# Patient Record
Sex: Male | Born: 1935 | Race: White | Hispanic: No | Smoking: Former smoker
Health system: Southern US, Community
[De-identification: ages and names within clinical notes are randomized; demographics above are authoritative.]

## PROBLEM LIST (undated history)

## (undated) DIAGNOSIS — R339 Retention of urine, unspecified: Secondary | ICD-10-CM

## (undated) DIAGNOSIS — N411 Chronic prostatitis: Secondary | ICD-10-CM

## (undated) DIAGNOSIS — G20 Parkinson's disease: Secondary | ICD-10-CM

## (undated) DIAGNOSIS — R55 Syncope and collapse: Secondary | ICD-10-CM

## (undated) DIAGNOSIS — G459 Transient cerebral ischemic attack, unspecified: Secondary | ICD-10-CM

## (undated) DIAGNOSIS — R972 Elevated prostate specific antigen [PSA]: Secondary | ICD-10-CM

## (undated) DIAGNOSIS — Z950 Presence of cardiac pacemaker: Secondary | ICD-10-CM

## (undated) DIAGNOSIS — G20A1 Parkinson's disease without dyskinesia, without mention of fluctuations: Secondary | ICD-10-CM

## (undated) DIAGNOSIS — R079 Chest pain, unspecified: Secondary | ICD-10-CM

## (undated) DIAGNOSIS — R350 Frequency of micturition: Secondary | ICD-10-CM

## (undated) DIAGNOSIS — N400 Enlarged prostate without lower urinary tract symptoms: Secondary | ICD-10-CM

## (undated) DIAGNOSIS — R2681 Unsteadiness on feet: Secondary | ICD-10-CM

## (undated) HISTORY — DX: Transient cerebral ischemic attack, unspecified: G45.9

## (undated) HISTORY — DX: Elevated prostate specific antigen (PSA): R97.20

## (undated) HISTORY — DX: Frequency of micturition: R35.0

## (undated) HISTORY — DX: Chronic prostatitis: N41.1

## (undated) HISTORY — DX: Benign prostatic hyperplasia without lower urinary tract symptoms: N40.0

## (undated) HISTORY — DX: Retention of urine, unspecified: R33.9

---

## 2005-08-09 ENCOUNTER — Ambulatory Visit: Payer: Self-pay | Admitting: Ophthalmology

## 2007-06-26 ENCOUNTER — Ambulatory Visit: Payer: Self-pay | Admitting: Gastroenterology

## 2007-11-29 ENCOUNTER — Other Ambulatory Visit: Payer: Self-pay

## 2007-11-29 ENCOUNTER — Inpatient Hospital Stay: Payer: Self-pay | Admitting: Internal Medicine

## 2008-01-09 ENCOUNTER — Ambulatory Visit: Payer: Self-pay | Admitting: Specialist

## 2013-02-14 DIAGNOSIS — N411 Chronic prostatitis: Secondary | ICD-10-CM | POA: Insufficient documentation

## 2013-02-14 DIAGNOSIS — R339 Retention of urine, unspecified: Secondary | ICD-10-CM | POA: Insufficient documentation

## 2013-02-14 DIAGNOSIS — R972 Elevated prostate specific antigen [PSA]: Secondary | ICD-10-CM | POA: Insufficient documentation

## 2013-02-14 DIAGNOSIS — R350 Frequency of micturition: Secondary | ICD-10-CM | POA: Insufficient documentation

## 2014-05-14 ENCOUNTER — Encounter (INDEPENDENT_AMBULATORY_CARE_PROVIDER_SITE_OTHER): Payer: Self-pay

## 2014-05-14 ENCOUNTER — Ambulatory Visit (INDEPENDENT_AMBULATORY_CARE_PROVIDER_SITE_OTHER): Payer: Medicare Other | Admitting: Cardiovascular Disease

## 2014-05-14 ENCOUNTER — Encounter: Payer: Self-pay | Admitting: Cardiovascular Disease

## 2014-05-14 VITALS — BP 110/82 | HR 92 | Ht 71.0 in | Wt 207.0 lb

## 2014-05-14 DIAGNOSIS — N400 Enlarged prostate without lower urinary tract symptoms: Secondary | ICD-10-CM

## 2014-05-14 DIAGNOSIS — N401 Enlarged prostate with lower urinary tract symptoms: Secondary | ICD-10-CM | POA: Insufficient documentation

## 2014-05-14 DIAGNOSIS — R5382 Chronic fatigue, unspecified: Secondary | ICD-10-CM

## 2014-05-14 DIAGNOSIS — R0602 Shortness of breath: Secondary | ICD-10-CM | POA: Insufficient documentation

## 2014-05-14 DIAGNOSIS — R29898 Other symptoms and signs involving the musculoskeletal system: Secondary | ICD-10-CM

## 2014-05-14 DIAGNOSIS — R079 Chest pain, unspecified: Secondary | ICD-10-CM | POA: Insufficient documentation

## 2014-05-14 DIAGNOSIS — R3915 Urgency of urination: Secondary | ICD-10-CM

## 2014-05-14 DIAGNOSIS — G20 Parkinson's disease: Secondary | ICD-10-CM

## 2014-05-14 NOTE — Assessment & Plan Note (Signed)
Followed by Dr Cope.  

## 2014-05-14 NOTE — Assessment & Plan Note (Signed)
Etiology of leg weakness is unclear. Unable to exclude deconditioning. He does not do regular exercise program. Possibly exacerbated by Parkinson's

## 2014-05-14 NOTE — Progress Notes (Signed)
Patient ID: Justin Hendrix, male    DOB: 02/14/1923, 80 y.o.   MRN: [id number]  HPI Comments: [name] is a pleasant 80 year old gentleman with a history of Parkinson's, tremors, seen by Dr. Achilles Dunkope for prostate issues and chronic prostatitis, urinary frequency, who presents with symptoms of fatigue, shortness of breath, chest pain, dizziness.  He reports having relatively acute onset of symptoms over the summer, 2-3 months ago. He did run a busy garden with vegetables. Over a short period of time, he found he was to short of breath and fatigued to manage the garden. He stopped attending to the garden and vegetables grew without him picking them, weeds grew. He had no energy. He had symptoms with minimal exertion and then had to sit down to recover. Morning seemed to be worse. Also reported having some lightheadedness sometimes when standing, sometimes when sitting. Occasional chest pain symptoms more commonly at rest than with exertion.  He denies any new stressors at home. Has family nearby. No financial issues. Reports he is sleeping relatively well Never had cardiac workup in the past  In terms of his social history, he used to smoke back in the [employer] but not for 60 years. No alcohol, no drugs In terms of his family history, he has hypertension in the family Ammann skin cancer, no significant cardiac issues. One family member with lung cancer  EKG shows normal sinus rhythm with rate 90 beats per minute with, branch block   Outpatient Encounter Prescriptions as of 05/14/2014  Medication Sig  . amantadine (SYMMETREL) 100 MG capsule Take 100 mg by mouth 2 (two) times daily.  Justin Hendrix. aspirin 81 MG tablet Take 81 mg by mouth daily.  . budesonide-formoterol (SYMBICORT) 160-4.5 MCG/ACT inhaler Inhale 2 puffs into the lungs 2 (two) times daily.  . finasteride (PROSCAR) 5 MG tablet Take 5 mg by mouth daily.  . fluticasone (FLONASE) 50 MCG/ACT nasal spray Place 2 sprays into both nostrils daily.  .  pantoprazole (PROTONIX) 40 MG tablet Take 40 mg by mouth daily.  . sertraline (ZOLOFT) 25 MG tablet Take 25 mg by mouth daily.  . tamsulosin (FLOMAX) 0.4 MG CAPS capsule Take 0.4 mg by mouth 2 (two) times daily.      Review of Systems  Constitutional: Positive for fatigue.  HENT: Negative.   Eyes: Negative.   Respiratory: Positive for chest tightness and shortness of breath.   Cardiovascular: Negative.   Gastrointestinal: Negative.   Endocrine: Negative.   Musculoskeletal: Negative.   Skin: Negative.   Allergic/Immunologic: Negative.   Neurological: Positive for weakness.  Hematological: Negative.   Psychiatric/Behavioral: Negative.   All other systems reviewed and are negative.   BP 110/82  Pulse 92  Ht 5\' 11"  (1.803 m)  Wt 207 lb (93.895 kg)  BMI 28.88 kg/m2  Physical Exam  Nursing note and vitals reviewed. Constitutional: He is oriented to person, place, and time. He appears well-developed and well-nourished.  HENT:  Head: Normocephalic.  Nose: Nose normal.  Mouth/Throat: Oropharynx is clear and moist.  Eyes: Conjunctivae are normal. Pupils are equal, round, and reactive to light.  Neck: Normal range of motion. Neck supple. No JVD present.  Cardiovascular: Normal rate, regular rhythm, S1 normal, S2 normal, normal heart sounds and intact distal pulses.  Exam reveals no gallop and no friction rub.   No murmur heard. Pulmonary/Chest: Effort normal and breath sounds normal. No respiratory distress. He has no wheezes. He has no rales. He exhibits no tenderness.  Abdominal: Soft. Bowel  sounds are normal. He exhibits no distension. There is no tenderness.  Musculoskeletal: Normal range of motion. He exhibits no edema and no tenderness.  Lymphadenopathy:    He has no cervical adenopathy.  Neurological: He is alert and oriented to person, place, and time. Coordination normal.  Skin: Skin is warm and dry. No rash noted. No erythema.  Psychiatric: He has a normal mood and  affect. His behavior is normal. Judgment and thought content normal.      Assessment and Plan

## 2014-05-14 NOTE — Assessment & Plan Note (Signed)
Etiology of his symptoms is unclear. Relatively acute onset. Unable to exclude ischemia. Clinical exam and EKG are relatively benign. We will schedule him for a pharmacologic Myoview to rule out ischemia. Clinical exam does not suggest valvular heart disease. If stress test is normal, could entertain other workup such as TSH, testosterone level, etc. We did even touch on possible depression and he did comment that "that could be it" He does see Dr. Arlana Pouchate in several days' time

## 2014-05-14 NOTE — Assessment & Plan Note (Signed)
He reports symptoms have been relatively stable. No tremor noted on today's visit

## 2014-05-14 NOTE — Assessment & Plan Note (Signed)
Etiology unclear, stress test ordered to rule out ischemia. He is unable to treadmill secondary to shortness of breath and leg weakness

## 2014-05-14 NOTE — Assessment & Plan Note (Signed)
Symptoms over the past 2-3 months. Workup as above

## 2014-05-14 NOTE — Patient Instructions (Addendum)
You are doing well. No medication changes were made.  We will schedule you for a stress test, lexiscan myoview for shortness of breath, fatigue and chest pain  Please call us if you have new issues that need to be addressed before your next appt.   [facility] MYOVIEW  Your caregiver has ordered a Stress Test with nuclear imaging. The purpose of this test is to evaluate the blood supply to your heart muscle. This procedure is referred to as a "Non-Invasive Stress Test." This is because other than having an IV started in your vein, nothing is inserted or "invades" your body. Cardiac stress tests are done to find areas of poor blood flow to the heart by determining the extent of coronary artery disease (CAD). Some patients exercise on a treadmill, which naturally increases the blood flow to your heart, while others who are  unable to walk on a treadmill due to physical limitations have a pharmacologic/chemical stress agent called Lexiscan . This medicine will mimic walking on a treadmill by temporarily increasing your coronary blood flow.   Please note: these test may take anywhere between 2-4 hours to complete  PLEASE REPORT TO [facility]  THE VOLUNTEERS AT THE FIRST DESK WILL DIRECT YOU WHERE TO GO  Date of Procedure:___Thursday, Oct 29________  Arrival Time for Procedure:____7:45am__________   PLEASE NOTIFY THE OFFICE AT LEAST 24 HOURS IN ADVANCE IF YOU ARE UNABLE TO KEEP YOUR APPOINTMENT.  [phone] AND  PLEASE NOTIFY N[facility]INE AT [facility] AT LEAST 24 HOURS IN ADVANCE IF YOU ARE UNABLE TO KEEP YOUR APPOINTMENT. [phone]  How to prepare for your Myoview test:  1. Do not eat or drink after midnight 2. No caffeine for 24 hours prior to test 3. No smoking 24 hours prior to test. 4. Your medication may be taken with water.  If your doctor stopped a medication because of this test, do not take that medication. 5. Ladies, please do not wear dresses.  Skirts or pants are  appropriate. Please wear a short sleeve shirt. 6. No perfume, cologne or lotion. 7. Wear comfortable walking shoes. No heels!  Your next appointment will be scheduled in our new office located at :  [facility]  [address], [address]  [place], [place] [id number]

## 2014-05-22 ENCOUNTER — Ambulatory Visit: Payer: Self-pay | Admitting: Cardiovascular Disease

## 2014-05-22 DIAGNOSIS — R079 Chest pain, unspecified: Secondary | ICD-10-CM

## 2014-05-23 ENCOUNTER — Other Ambulatory Visit: Payer: Self-pay

## 2014-05-23 DIAGNOSIS — R079 Chest pain, unspecified: Secondary | ICD-10-CM

## 2014-05-23 DIAGNOSIS — R0602 Shortness of breath: Secondary | ICD-10-CM

## 2014-09-15 ENCOUNTER — Observation Stay: Payer: Self-pay | Admitting: Internal Medicine

## 2014-09-16 ENCOUNTER — Ambulatory Visit: Payer: Self-pay | Admitting: Neurology

## 2014-09-16 DIAGNOSIS — I351 Nonrheumatic aortic (valve) insufficiency: Secondary | ICD-10-CM

## 2014-11-23 NOTE — H&P (Signed)
PATIENT NAME:  Justin Hendrix, Justin Hendrix MR#:  [id number] DATE OF BIRTH:  [birth year]  DATE OF ADMISSION:  09/15/2014  REFERRING EMERGENCY ROOM PHYSICIAN:  Daryel November, MD.   PRIMARY CARE PHYSICIAN: Elray Buba MD.    CHIEF COMPLAINT: Syncope with head injury.   HISTORY OF PRESENT ILLNESS: This very pleasant 81 year old man with past medical history of Parkinson disease, possible TIA in the past, gastroesophageal reflux disease and hypertension, presents today after syncopal event at home. He reports that he was walking towards the bathroom when he passed out and fell to the ground. He does not remember any prodrome, no dizziness, palpitations, or weakness, the next thing he remembers he was on the ground. History provided by his son who was present in the house is that the son heard a thump and ran to find his father face down on the floor with blood on the left side of his head.  The son then called EMS. He noted some movement and that his father had lost bladder continence. Upon arrival of EMS it is unclear whether the patient was confused, seems that he was oriented. He hit his head on the door frame on the way down and has a very large laceration on the right scalp. He has no history of seizure. He does report that for many months now he has been getting very dizzy when standing for long periods of time or right after standing up and he has also been having headaches.  He has been working with his primary care physician on this, but he has not had any improvement.   PAST MEDICAL HISTORY:  1. Hypertension, not currently on any antihypertensives.  2. Gastroesophageal reflux disease.  3. Parkinson disease.  4. Depression.  5. History of prostatitis with no recent flares.  6. Chronic back pain.  7. Possible history of TIA.   SOCIAL HISTORY: The patient lives with his son. He does not use a cane or a walker. He does chew tobacco since the age of 54.  He does not smoke cigarettes currently but did  smoke heavily in the past. Does not drink alcohol or use any illicit substances. He was in the [employer].   FAMILY MEDICAL HISTORY: Positive for coronary artery disease and stroke in his father, cervical cancer in his mother.   ALLERGIES: No known allergies.   HOME MEDICATIONS:  1. Vitamin B12 500 mcg 1 tablet once a day.  2. Tizanidine 2 mg 1 tablet twice a day.  3. Tamsulosin 0.4 mg 1 capsule twice a day.  4. Symbicort 160 mcg-4.5 mcg per inhalation 2 puffs inhaled twice a day.  5. Sertraline 100 mg 1 tablet once a day.  6. Pantoprazole 40 mg 1 tablet once a day.  7. Fluticasone nasal spray 50 mcg per inhalations 1 spray once a day.  8. Finasteride 5 mg 1 tablet once a day.  9. Aspirin 81 mg 1 tablet once a day.  10. Amantadine 100 mg 1 capsule 2 times a day.     REVIEW OF SYSTEMS:  CONSTITUTIONAL: Negative for fevers, fatigue, weakness, pain, or change in weight.  HEENT: No change in vision or hearing. No pain in the eyes or ears. No sinus congestion, sore throat, or difficulty swallowing.  RESPIRATORY: No cough, wheezing, shortness of breath, or painful respirations.  CARDIOVASCULAR: No chest pain, palpitations, edema, orthopnea. Positive for syncope as noted above.  GASTROINTESTINAL: No nausea, vomiting, diarrhea. Positive for decreased appetite for the past 4-5 days. Negative for abdominal  pain.  GENITOURINARY: No dysuria or frequency. No pain suggestive of prostatitis flare.  SKIN: No new rashes or lesions. There is a new laceration on the right scalp from fall.  HEMATOLOGIC: No easy bruising or bleeding.  ENDOCRINE: No polyuria, polydipsia, hot or cold intolerance.  MUSCULOSKELETAL: No new pain in the neck, back, shoulders, knees, or hips. He does have a history of osteoarthritis. No gout.  NEUROLOGIC: Possible history of TIA. The patient reports he has had a workup in the past which was negative. No history of seizure. No focal numbness or weakness. He does have a history of  Parkinson disease, has sometimes difficulty with fine motor on the right hand and has a stumbling gait. Positive for recent lightheadedness with standing and headache.  PSYCHIATRIC: No schizophrenia or bipolar disorder. Positive for history of depression.   PHYSICAL EXAMINATION:  VITAL SIGNS: Temperature 97.4, pulse 65, respirations 16, blood pressure 127/89, oxygenation 99% on room air.  GENERAL: No acute distress. Patient resting comfortably in the exam bed with a bandage around his head.  HEENT: Pupils equal, round, and reactive to light. Conjunctivae are clear. Extraocular motion intact. Oral mucous membranes pink and moist. Posterior oropharynx is clear with no exudate, erythema, or edema.  NECK: Supple. Trachea midline. No cervical lymphadenopathy.  RESPIRATORY: Lungs clear to auscultation bilaterally with good air movement.  CARDIOVASCULAR: Regular rate and rhythm. No murmurs, rubs, or gallops. No peripheral edema. Peripheral pulses 2 +.  ABDOMEN: Soft, nontender, nondistended. Bowel sounds normal. No guarding or rebound. No hepatosplenomegaly. No mass.  MUSCULOSKELETAL: No joint effusions. Range of motion normal. Strength 5 out of 5 throughout.  SKIN: He has many moles, skin tags, and hemangiomas over the entirety of the skin. No rash. He does have a laceration on the right scalp which is currently bandaged, the bandage is clean and dry, not currently bleeding.  NEUROLOGIC: Cranial nerves II through XII grossly intact. Strength and sensation are intact. Cerebellar exam is normal. Good tone.  PSYCHIATRIC: Alert and oriented x 4 with good insight into his clinical condition. No signs of uncontrolled depression or anxiety.   LABORATORY DATA: Sodium 131, potassium 3.9, chloride 97, bicarbonate 24, BUN 14, creatinine 1.62, glucose 178, calcium 8.1. Total protein 6.1, albumin 2.7. Other LFTs are normal. CK 104, CK-MB less than 0.05. Troponin less than 0.02. Hemoglobin 10, white blood cells 6.8,  platelets 271,000, MCV is 75.   IMAGING: CT scan of the head without contrast shows soft tissue swelling and laceration overlying the right frontal bone, no evidence of calvarium fracture, no evidence of acute intracranial abnormality, atrophy with small vessel ischemic changes and intracranial atherosclerosis is present.    Chest x-ray shows no evidence of cardiopulmonary disease.     ASSESSMENT AND PLAN:  1.  Syncopal event:  It sounds as if with his decreased appetite for several days, recent dizziness with standing, and presenting blood pressure of 96/60, this was most likely an orthostatic event. I am replacing volume with IV fluids. He is not on any antihypertensives or diuretics at home. He is on several medications for BPH. I will hold his tamsulosin at this time. We will observe on telemetry. Check echocardiogram to look at valvular function. Cycle cardiac enzymes. He did have loss of bladder continence sounds and some jerky type movement on the floor after the syncopal event. These could potentially represent seizure, more likely he was on his way to the bathroom and then lost bladder control when he syncopized. We will consult  neurology for further recommendations.  2.  Acute kidney injury: I suspect that this is due to decreased p.o. intake and acute tubular necrosis. We will replace IV fluids and recheck in the morning. I do not know his baseline creatinine, he may also have chronic kidney disease.  3.  Hyponatremia: Mild hyponatremia likely due to decreased p.o. intake. We will replace with IV fluids. Recheck in the morning.  4.  Anemia: We will guaiac stools and check iron studies. He has a low MCV indicative of iron deficiency. He has never had a colonoscopy. No signs of overt bleeding, may benefit from an iron supplement discharge.  5.  Hyperglycemia: Check a hemoglobin A1c to evaluate blood sugars over the past 3 months. 6.  Head wound:  ED physician has placed staples. No further  bleeding.  7.  Parkinson disease: The patient reports that he is not currently followed by a neurologist. He is on amantadine, he is not on Sinemet. We will continue amantadine.  8.  Gastroesophageal reflux disease: Continue PPI.  9.  Prophylaxis: No heparin for DVT prophylaxis as he does have a head wound. He is also ambulatory at baseline. We will provide TEDs and SCDs rather than pharmacological DVT prophylaxis.  10.  Code status: The patient is a DNR.  I discussed this with him on admission and he does not want cardiopulmonary resuscitation.   TIME SPENT ON ADMISSION: 45 minutes.     ____________________________ Ena Dawleyatherine P. Clent RidgesWalsh, MD cpw:bu D: 09/15/2014 19:57:10 ET T: 09/15/2014 20:28:56 ET JOB#: [id number]  cc: Santina Evansatherine P. Clent RidgesWalsh, MD, <Dictator> Jillene Bucksenny C. Arlana Pouchate, MD Gale JourneyATHERINE P Rmoni Keplinger MD ELECTRONICALLY SIGNED 09/16/2014 8:31

## 2014-11-23 NOTE — Discharge Summary (Signed)
PATIENT NAME:  Justin Hendrix, Rishith W MR#:  [id number] DATE OF BIRTH:  01/19/1923  DATE OF ADMISSION:  09/15/2014 DATE OF DISCHARGE:  09/16/2014  PRESENTING COMPLAINT: Syncopal episode.   DISCHARGE DIAGNOSES:  1.  Syncope suspected due to orthostatic hypotension.  2.  Benign prostatic hypertrophy.  3.  Right far lateral skull laceration status post sutures in the Emergency Room.   CODE STATUS: Full code.   MEDICATIONS:  1.  Aspirin 81 mg daily.  2.  Vitamin B12 at 500 mcg p.o. daily.  3.  Protonix 40 mg p.o. daily.  4.  Sertraline 100 mg p.o. daily.  5.  Amantadine 100 mg b.i.d.  6.  Tamsulosin 0.4 mg b.i.d.  7.  Finasteride 5 mg daily.  8.  Tizanidine 2 mg b.i.d.  9.  Fluticasone nasal spray daily.  10.  Symbicort 160/4.5 two puffs b.i.d.   SERVICES: Home health PT has been arranged.   FOLLOWUP: With Dr. Arlana Pouchate in 1-2 weeks.   LABORATORY DATA: At discharge: Hemoglobin and hematocrit is 10.1 and 30.6.  Echo showed ejection fraction of 60% to 65%. No wall motion abnormality. No RVSP. Mild mitral valve regurgitation. Occult feces negative. Creatinine was 1.37.   BRIEF SUMMARY OF HOSPITAL COURSE: [name] is a very pleasant 81 year old gentleman who comes in with:  1.  Syncopal event presenting with blood pressure 96/60. Likely an orthostatic event. Received IV fluids. Not on any hypertensives or diuretics at home. The patient was encouraged p.o. fluids and his blood pressure at discharge was stable.  2.  Acute kidney injury suspected due to poor p.o. intake. Replaced IV fluids.  3.  Hyponatremia secondary to dehydration. IV fluids were received. The patient euvolemic prior to discharge.  4.  Hospital stay otherwise remained stable. The patient remained a full code. Physical therapy recommended home PT, which has been arranged.   TIME SPENT: 40 minutes. Discharge plan was discussed with the patient's son.    ____________________________ Wylie HailSona A. Allena KatzPatel, MD sap:bm D: 09/18/2014  14:28:20 ET T: 09/19/2014 01:29:22 ET JOB#: [id number]  cc: Kasee Hantz A. Allena KatzPatel, MD, <Dictator> Willow OraSONA A Ellissa Ayo MD ELECTRONICALLY SIGNED 09/23/2014 17:35

## 2014-11-23 NOTE — Consult Note (Signed)
PATIENT NAME:  Justin Hendrix, Justin Hendrix MR#:  [id number] DATE OF BIRTH:  Jan 31, 1923  DATE OF CONSULTATION:  09/16/2014  REFERRING PHYSICIAN:   CONSULTING PHYSICIAN:  Pauletta BrownsYuriy Trinidi Toppins, MD  REASON FOR CONSULTATION: Syncope.   HISTORY OF PRESENT ILLNESS: This is an 81 year old male with past medical history of questionable Parkinson disease, previous TIA, gastroesophageal reflux disease, status post syncopal event and fall. On admission, the patient appeared to be hypotensive, required IV hydration. When questioned about Parkinson disease, the patient is only on amantadine. Parkinson disease diagnosed by family doctor 8 or 9 years ago. The patient does not appear to have any tremors and no rigidity. Currently appears back to baseline, asking about discharge.   PAST MEDICAL HISTORY: Hypertension, gastroesophageal reflux disease, Parkinson's, depression, history of prostatitis, chronic back pain, history of transient ischemic attack.   REVIEW OF SYSTEMS: No shortness of breath. No chest pain. No abdominal pain. No new weakness on one side of the body compared to the other. No dizziness.   HOME MEDICATIONS: Have been reviewed.   ALLERGIES: No known drug allergies.   FAMILY HISTORY: Positive for coronary artery disease.  SOCIAL HISTORY: The patient lives with his son, chews tobacco since young age.   NEUROLOGIC EVALUATION: The patient is awake, alert and oriented to time, place, location, and the reason for why he is in the hospital. Facial sensation intact. Facial motor is intact. Tongue is midline. Uvula elevates symmetrically. Shoulder shrug intact. Motor 4+/5 in bilateral upper and lower extremities. Coordination: Finger-to-nose intact. Sensation intact. Reflexes intact. Gait not assessed.   IMPRESSION: An 81 year old male admitted with a syncopal event, now back to baseline. The patient's blood pressure was low on admission, status post IV hydration, states he feels better. I do not think this is  seizure activity. No further intervention from a neurological standpoint at this point while in the hospital. The patient should follow up with neurology as outpatient. I am not convinced this is a true diagnosis of Parkinson disease as I have not experienced seeing the patient with a tremor. His muscle tone is within normal limits as well. The patient has not progressed in the past 8 to 9 years. He has never been evaluated by a neurologist so there is question of Parkinson disease. Discharge planning.   Thank you. It was a pleasure seeing this patient.   ____________________________ Pauletta BrownsYuriy Nilah Belcourt, MD yz:sb D: 09/16/2014 13:41:25 ET T: 09/16/2014 14:10:44 ET JOB#: [id number]  cc: Pauletta BrownsYuriy Yumna Ebers, MD, <Dictator> Pauletta BrownsYURIY Retha Bither MD ELECTRONICALLY SIGNED 10/09/2014 12:06

## 2016-05-10 ENCOUNTER — Ambulatory Visit (INDEPENDENT_AMBULATORY_CARE_PROVIDER_SITE_OTHER): Payer: Medicare Other | Admitting: Podiatry

## 2016-05-10 ENCOUNTER — Encounter: Payer: Self-pay | Admitting: Podiatry

## 2016-05-10 VITALS — BP 172/127 | HR 93 | Resp 18 | Wt 210.0 lb

## 2016-05-10 DIAGNOSIS — L603 Nail dystrophy: Secondary | ICD-10-CM | POA: Diagnosis not present

## 2016-05-10 DIAGNOSIS — M79609 Pain in unspecified limb: Principal | ICD-10-CM

## 2016-05-10 DIAGNOSIS — B351 Tinea unguium: Secondary | ICD-10-CM | POA: Diagnosis not present

## 2016-05-10 DIAGNOSIS — M79676 Pain in unspecified toe(s): Secondary | ICD-10-CM

## 2016-05-10 DIAGNOSIS — L608 Other nail disorders: Secondary | ICD-10-CM

## 2016-05-10 NOTE — Progress Notes (Signed)
   Subjective:    Patient ID: Justin MusaCameron W Hendrix, male    DOB: 10/24/1922, 82 y.o.   MRN: [id number]  HPI    Review of Systems  All other systems reviewed and are negative.      Objective:   Physical Exam        Assessment & Plan:

## 2016-05-11 NOTE — Progress Notes (Signed)
Patient ID: Candis MusaCameron W Macpherson, male   DOB: 01/24/1923, 82 y.o.   MRN: [id number] SUBJECTIVE Patient  presents to office today complaining of elongated, thickened nails. Pain while ambulating in shoes. Patient is unable to trim their own nails.   OBJECTIVE General Patient is awake, alert, and oriented x 3 and in no acute distress. Derm Skin is dry and supple bilateral. Negative open lesions or macerations. Remaining integument unremarkable. Nails are tender, long, thickened and dystrophic with subungual debris, consistent with onychomycosis, 1-5 bilateral. No signs of infection noted. Vasc  DP and PT pedal pulses palpable bilaterally. Temperature gradient within normal limits.  Neuro Epicritic and protective threshold sensation diminished bilaterally.  Musculoskeletal Exam No symptomatic pedal deformities noted bilateral. Muscular strength within normal limits.  ASSESSMENT 1. Onychodystrophic nails 1-5 bilateral with hyperkeratosis of nails.  2. Onychomycosis of nail due to dermatophyte bilateral 3. Pain in foot bilateral  PLAN OF CARE 1. Patient evaluated today.  2. Instructed to maintain good pedal hygiene and foot care.  3. Mechanical debridement of nails 1-5 bilaterally performed using a nail nipper. Filed with dremel without incident.  4. Return to clinic in 3 mos.    Felecia ShellingBrent M Zayvion Stailey, DPM

## 2016-08-15 ENCOUNTER — Encounter: Payer: Self-pay | Admitting: Podiatry

## 2016-08-15 ENCOUNTER — Ambulatory Visit (INDEPENDENT_AMBULATORY_CARE_PROVIDER_SITE_OTHER): Payer: Medicare Other | Admitting: Podiatry

## 2016-08-15 DIAGNOSIS — B351 Tinea unguium: Secondary | ICD-10-CM | POA: Diagnosis not present

## 2016-08-15 DIAGNOSIS — M79609 Pain in unspecified limb: Secondary | ICD-10-CM

## 2016-08-15 NOTE — Progress Notes (Signed)
Complaint:  Visit Type: Patient returns to my office for continued preventative foot care services. Complaint: Patient states" my nails have grown long and thick and become painful to walk and wear shoes" Patient has been diagnosed with DM with no foot complications. The patient presents for preventative foot care services. No changes to ROS  Podiatric Exam: Vascular: dorsalis pedis and posterior tibial pulses are palpable bilateral. Capillary return is immediate. Temperature gradient is WNL. Skin turgor WNL  Sensorium: Diminished  Semmes Weinstein monofilament test. Normal tactile sensation bilaterally. Nail Exam: Pt has thick disfigured discolored nails with subungual debris noted bilateral entire nail hallux through fifth toenails Ulcer Exam: There is no evidence of ulcer or pre-ulcerative changes or infection. Orthopedic Exam: Muscle tone and strength are WNL. No limitations in general ROM. No crepitus or effusions noted. Foot type and digits show no abnormalities. Bony prominences are unremarkable. Skin: No Porokeratosis. No infection or ulcers  Diagnosis:  Onychomycosis, , Pain in right toe, pain in left toes  Treatment & Plan Procedures and Treatment: Consent by patient was obtained for treatment procedures. The patient understood the discussion of treatment and procedures well. All questions were answered thoroughly reviewed. Debridement of mycotic and hypertrophic toenails, 1 through 5 bilateral and clearing of subungual debris. No ulceration, no infection noted.  Return Visit-Office Procedure: Patient instructed to return to the office for a follow up visit 3 months for continued evaluation and treatment.    Mayerli Kirst DPM 

## 2016-08-16 ENCOUNTER — Ambulatory Visit: Payer: Medicare Other | Admitting: Podiatry

## 2016-12-01 ENCOUNTER — Ambulatory Visit (INDEPENDENT_AMBULATORY_CARE_PROVIDER_SITE_OTHER): Payer: Medicare Other | Admitting: Podiatry

## 2016-12-01 ENCOUNTER — Encounter: Payer: Self-pay | Admitting: Podiatry

## 2016-12-01 DIAGNOSIS — M79609 Pain in unspecified limb: Secondary | ICD-10-CM | POA: Diagnosis not present

## 2016-12-01 DIAGNOSIS — B351 Tinea unguium: Secondary | ICD-10-CM

## 2016-12-01 NOTE — Progress Notes (Signed)
Complaint:  Visit Type: Patient returns to my office for continued preventative foot care services. Complaint: Patient states" my nails have grown long and thick and become painful to walk and wear shoes" Patient has been diagnosed with DM with no foot complications. The patient presents for preventative foot care services. No changes to ROS  Podiatric Exam: Vascular: dorsalis pedis and posterior tibial pulses are palpable bilateral. Capillary return is immediate. Temperature gradient is WNL. Skin turgor WNL  Sensorium: Diminished  Semmes Weinstein monofilament test. Normal tactile sensation bilaterally. Nail Exam: Pt has thick disfigured discolored nails with subungual debris noted bilateral entire nail hallux through fifth toenails Ulcer Exam: There is no evidence of ulcer or pre-ulcerative changes or infection. Orthopedic Exam: Muscle tone and strength are WNL. No limitations in general ROM. No crepitus or effusions noted. Foot type and digits show no abnormalities. Bony prominences are unremarkable. Skin: No Porokeratosis. No infection or ulcers  Diagnosis:  Onychomycosis, , Pain in right toe, pain in left toes  Treatment & Plan Procedures and Treatment: Consent by patient was obtained for treatment procedures. The patient understood the discussion of treatment and procedures well. All questions were answered thoroughly reviewed. Debridement of mycotic and hypertrophic toenails, 1 through 5 bilateral and clearing of subungual debris. No ulceration, no infection noted.  Return Visit-Office Procedure: Patient instructed to return to the office for a follow up visit 3 months for continued evaluation and treatment.    Alasha Mcguinness DPM 

## 2017-03-06 ENCOUNTER — Ambulatory Visit (INDEPENDENT_AMBULATORY_CARE_PROVIDER_SITE_OTHER): Payer: Medicare Other | Admitting: Podiatry

## 2017-03-06 ENCOUNTER — Encounter: Payer: Self-pay | Admitting: Podiatry

## 2017-03-06 DIAGNOSIS — B351 Tinea unguium: Secondary | ICD-10-CM | POA: Diagnosis not present

## 2017-03-06 DIAGNOSIS — M79609 Pain in unspecified limb: Secondary | ICD-10-CM | POA: Diagnosis not present

## 2017-03-06 NOTE — Progress Notes (Signed)
Complaint:  Visit Type: Patient returns to my office for continued preventative foot care services. Complaint: Patient states" my nails have grown long and thick and become painful to walk and wear shoes" Patient has been diagnosed with DM with no foot complications. The patient presents for preventative foot care services. No changes to ROS  Podiatric Exam: Vascular: dorsalis pedis and posterior tibial pulses are palpable bilateral. Capillary return is immediate. Temperature gradient is WNL. Skin turgor WNL  Sensorium: Diminished  Semmes Weinstein monofilament test. Normal tactile sensation bilaterally. Nail Exam: Pt has thick disfigured discolored nails with subungual debris noted bilateral entire nail hallux through fifth toenails Ulcer Exam: There is no evidence of ulcer or pre-ulcerative changes or infection. Orthopedic Exam: Muscle tone and strength are WNL. No limitations in general ROM. No crepitus or effusions noted. Foot type and digits show no abnormalities. Bony prominences are unremarkable. Skin: No Porokeratosis. No infection or ulcers  Diagnosis:  Onychomycosis, , Pain in right toe, pain in left toes  Treatment & Plan Procedures and Treatment: Consent by patient was obtained for treatment procedures. The patient understood the discussion of treatment and procedures well. All questions were answered thoroughly reviewed. Debridement of mycotic and hypertrophic toenails, 1 through 5 bilateral and clearing of subungual debris. No ulceration, no infection noted.  Return Visit-Office Procedure: Patient instructed to return to the office for a follow up visit 3 months for continued evaluation and treatment.    Sharnise Blough DPM 

## 2017-06-08 ENCOUNTER — Ambulatory Visit: Payer: Medicare Other | Admitting: Podiatry

## 2017-06-12 ENCOUNTER — Ambulatory Visit: Payer: Medicare Other | Admitting: Podiatry

## 2017-06-12 ENCOUNTER — Encounter: Payer: Self-pay | Admitting: Podiatry

## 2017-06-12 DIAGNOSIS — B351 Tinea unguium: Secondary | ICD-10-CM

## 2017-06-12 DIAGNOSIS — M79609 Pain in unspecified limb: Secondary | ICD-10-CM | POA: Diagnosis not present

## 2017-06-12 NOTE — Progress Notes (Signed)
Complaint:  Visit Type: Patient returns to my office for continued preventative foot care services. Complaint: Patient states" my nails have grown long and thick and become painful to walk and wear shoes" Patient has been diagnosed with DM with no foot complications. The patient presents for preventative foot care services. No changes to ROS  Podiatric Exam: Vascular: dorsalis pedis and posterior tibial pulses are palpable bilateral. Capillary return is immediate. Temperature gradient is WNL. Skin turgor WNL  Sensorium: Diminished  Semmes Weinstein monofilament test. Normal tactile sensation bilaterally. Nail Exam: Pt has thick disfigured discolored nails with subungual debris noted bilateral entire nail hallux through fifth toenails Ulcer Exam: There is no evidence of ulcer or pre-ulcerative changes or infection. Orthopedic Exam: Muscle tone and strength are WNL. No limitations in general ROM. No crepitus or effusions noted. Foot type and digits show no abnormalities. Bony prominences are unremarkable. Skin: No Porokeratosis. No infection or ulcers  Diagnosis:  Onychomycosis, , Pain in right toe, pain in left toes  Treatment & Plan Procedures and Treatment: Consent by patient was obtained for treatment procedures. The patient understood the discussion of treatment and procedures well. All questions were answered thoroughly reviewed. Debridement of mycotic and hypertrophic toenails, 1 through 5 bilateral and clearing of subungual debris. No ulceration, no infection noted.  Return Visit-Office Procedure: Patient instructed to return to the office for a follow up visit 3 months for continued evaluation and treatment.    Haili Donofrio DPM 

## 2017-09-14 ENCOUNTER — Ambulatory Visit (INDEPENDENT_AMBULATORY_CARE_PROVIDER_SITE_OTHER): Payer: Medicare Other | Admitting: Podiatry

## 2017-09-14 ENCOUNTER — Encounter: Payer: Self-pay | Admitting: Podiatry

## 2017-09-14 DIAGNOSIS — M79609 Pain in unspecified limb: Secondary | ICD-10-CM | POA: Diagnosis not present

## 2017-09-14 DIAGNOSIS — B351 Tinea unguium: Secondary | ICD-10-CM

## 2017-09-14 NOTE — Progress Notes (Signed)
Complaint:  Visit Type: Patient returns to my office for continued preventative foot care services. Complaint: Patient states" my nails have grown long and thick and become painful to walk and wear shoes" Patient has been diagnosed with DM with no foot complications. The patient presents for preventative foot care services. No changes to ROS  Podiatric Exam: Vascular: dorsalis pedis and posterior tibial pulses are palpable bilateral. Capillary return is immediate. Temperature gradient is WNL. Skin turgor WNL  Sensorium: Diminished  Semmes Weinstein monofilament test. Normal tactile sensation bilaterally. Nail Exam: Pt has thick disfigured discolored nails with subungual debris noted bilateral entire nail hallux through fifth toenails Ulcer Exam: There is no evidence of ulcer or pre-ulcerative changes or infection. Orthopedic Exam: Muscle tone and strength are WNL. No limitations in general ROM. No crepitus or effusions noted. Foot type and digits show no abnormalities. Bony prominences are unremarkable. Skin: No Porokeratosis. No infection or ulcers  Diagnosis:  Onychomycosis, , Pain in right toe, pain in left toes  Treatment & Plan Procedures and Treatment: Consent by patient was obtained for treatment procedures. The patient understood the discussion of treatment and procedures well. All questions were answered thoroughly reviewed. Debridement of mycotic and hypertrophic toenails, 1 through 5 bilateral and clearing of subungual debris. No ulceration, no infection noted.  Return Visit-Office Procedure: Patient instructed to return to the office for a follow up visit 3 months for continued evaluation and treatment.    Licet Dunphy DPM 

## 2017-11-03 ENCOUNTER — Inpatient Hospital Stay (HOSPITAL_COMMUNITY)
Admit: 2017-11-03 | Discharge: 2017-11-03 | Disposition: A | Discharge status: Home / Self Care | Payer: Medicare Other | Attending: Nurse Practitioner | Admitting: Nurse Practitioner

## 2017-11-03 ENCOUNTER — Encounter
Admission: EM | Disposition: A | Discharge status: Other Acute Inpatient Hospital | Payer: Self-pay | Source: Home / Self Care | Attending: Internal Medicine

## 2017-11-03 ENCOUNTER — Emergency Department: Payer: Medicare Other

## 2017-11-03 ENCOUNTER — Encounter (HOSPITAL_COMMUNITY): Payer: Self-pay | Admitting: Cardiology

## 2017-11-03 ENCOUNTER — Encounter (HOSPITAL_COMMUNITY)
Admission: EM | Disposition: A | Discharge status: Skilled Nursing Facility | Payer: Self-pay | Source: Home / Self Care | Attending: Internal Medicine

## 2017-11-03 ENCOUNTER — Inpatient Hospital Stay (HOSPITAL_COMMUNITY): Payer: Medicare Other

## 2017-11-03 ENCOUNTER — Inpatient Hospital Stay
Admission: EM | Admit: 2017-11-03 | Discharge: 2017-11-03 | DRG: 536 | Disposition: A | Discharge status: Other Acute Inpatient Hospital | Payer: Medicare Other | Attending: Internal Medicine | Admitting: Internal Medicine

## 2017-11-03 ENCOUNTER — Inpatient Hospital Stay (HOSPITAL_COMMUNITY)
Admission: EM | Admit: 2017-11-03 | Discharge: 2017-11-07 | DRG: 242 | Disposition: A | Discharge status: Skilled Nursing Facility | Payer: Medicare Other | Attending: Internal Medicine | Admitting: Internal Medicine

## 2017-11-03 ENCOUNTER — Other Ambulatory Visit: Payer: Self-pay

## 2017-11-03 DIAGNOSIS — R2681 Unsteadiness on feet: Secondary | ICD-10-CM | POA: Diagnosis present

## 2017-11-03 DIAGNOSIS — W1830XA Fall on same level, unspecified, initial encounter: Secondary | ICD-10-CM | POA: Diagnosis present

## 2017-11-03 DIAGNOSIS — R339 Retention of urine, unspecified: Secondary | ICD-10-CM | POA: Diagnosis present

## 2017-11-03 DIAGNOSIS — Z01810 Encounter for preprocedural cardiovascular examination: Secondary | ICD-10-CM | POA: Diagnosis not present

## 2017-11-03 DIAGNOSIS — R001 Bradycardia, unspecified: Secondary | ICD-10-CM | POA: Diagnosis present

## 2017-11-03 DIAGNOSIS — Z7951 Long term (current) use of inhaled steroids: Secondary | ICD-10-CM

## 2017-11-03 DIAGNOSIS — F17220 Nicotine dependence, chewing tobacco, uncomplicated: Secondary | ICD-10-CM | POA: Diagnosis present

## 2017-11-03 DIAGNOSIS — N411 Chronic prostatitis: Secondary | ICD-10-CM | POA: Diagnosis present

## 2017-11-03 DIAGNOSIS — Y92008 Other place in unspecified non-institutional (private) residence as the place of occurrence of the external cause: Secondary | ICD-10-CM

## 2017-11-03 DIAGNOSIS — R079 Chest pain, unspecified: Secondary | ICD-10-CM

## 2017-11-03 DIAGNOSIS — Z950 Presence of cardiac pacemaker: Secondary | ICD-10-CM

## 2017-11-03 DIAGNOSIS — Z8249 Family history of ischemic heart disease and other diseases of the circulatory system: Secondary | ICD-10-CM | POA: Diagnosis not present

## 2017-11-03 DIAGNOSIS — R338 Other retention of urine: Secondary | ICD-10-CM | POA: Diagnosis present

## 2017-11-03 DIAGNOSIS — R5382 Chronic fatigue, unspecified: Secondary | ICD-10-CM | POA: Diagnosis present

## 2017-11-03 DIAGNOSIS — M25551 Pain in right hip: Secondary | ICD-10-CM | POA: Diagnosis not present

## 2017-11-03 DIAGNOSIS — Z8673 Personal history of transient ischemic attack (TIA), and cerebral infarction without residual deficits: Secondary | ICD-10-CM

## 2017-11-03 DIAGNOSIS — N183 Chronic kidney disease, stage 3 (moderate): Secondary | ICD-10-CM | POA: Diagnosis present

## 2017-11-03 DIAGNOSIS — I442 Atrioventricular block, complete: Principal | ICD-10-CM

## 2017-11-03 DIAGNOSIS — I4510 Unspecified right bundle-branch block: Secondary | ICD-10-CM | POA: Diagnosis present

## 2017-11-03 DIAGNOSIS — S72141A Displaced intertrochanteric fracture of right femur, initial encounter for closed fracture: Principal | ICD-10-CM

## 2017-11-03 DIAGNOSIS — R55 Syncope and collapse: Secondary | ICD-10-CM | POA: Diagnosis present

## 2017-11-03 DIAGNOSIS — Y93K1 Activity, walking an animal: Secondary | ICD-10-CM

## 2017-11-03 DIAGNOSIS — N401 Enlarged prostate with lower urinary tract symptoms: Secondary | ICD-10-CM | POA: Diagnosis present

## 2017-11-03 DIAGNOSIS — Z79899 Other long term (current) drug therapy: Secondary | ICD-10-CM

## 2017-11-03 DIAGNOSIS — N189 Chronic kidney disease, unspecified: Secondary | ICD-10-CM | POA: Diagnosis present

## 2017-11-03 DIAGNOSIS — J9811 Atelectasis: Secondary | ICD-10-CM | POA: Diagnosis not present

## 2017-11-03 DIAGNOSIS — Y9301 Activity, walking, marching and hiking: Secondary | ICD-10-CM | POA: Diagnosis present

## 2017-11-03 DIAGNOSIS — N400 Enlarged prostate without lower urinary tract symptoms: Secondary | ICD-10-CM | POA: Diagnosis present

## 2017-11-03 DIAGNOSIS — J9601 Acute respiratory failure with hypoxia: Secondary | ICD-10-CM | POA: Diagnosis not present

## 2017-11-03 DIAGNOSIS — I5030 Unspecified diastolic (congestive) heart failure: Secondary | ICD-10-CM

## 2017-11-03 DIAGNOSIS — W010XXA Fall on same level from slipping, tripping and stumbling without subsequent striking against object, initial encounter: Secondary | ICD-10-CM | POA: Diagnosis present

## 2017-11-03 DIAGNOSIS — G20 Parkinson's disease: Secondary | ICD-10-CM | POA: Diagnosis present

## 2017-11-03 DIAGNOSIS — R531 Weakness: Secondary | ICD-10-CM | POA: Diagnosis present

## 2017-11-03 DIAGNOSIS — Z09 Encounter for follow-up examination after completed treatment for conditions other than malignant neoplasm: Secondary | ICD-10-CM

## 2017-11-03 DIAGNOSIS — D62 Acute posthemorrhagic anemia: Secondary | ICD-10-CM | POA: Diagnosis not present

## 2017-11-03 DIAGNOSIS — I1310 Hypertensive heart and chronic kidney disease without heart failure, with stage 1 through stage 4 chronic kidney disease, or unspecified chronic kidney disease: Secondary | ICD-10-CM | POA: Diagnosis present

## 2017-11-03 DIAGNOSIS — Z8781 Personal history of (healed) traumatic fracture: Secondary | ICD-10-CM

## 2017-11-03 DIAGNOSIS — I4891 Unspecified atrial fibrillation: Secondary | ICD-10-CM | POA: Diagnosis present

## 2017-11-03 DIAGNOSIS — Z87891 Personal history of nicotine dependence: Secondary | ICD-10-CM | POA: Diagnosis not present

## 2017-11-03 DIAGNOSIS — Z66 Do not resuscitate: Secondary | ICD-10-CM | POA: Diagnosis present

## 2017-11-03 DIAGNOSIS — Z7982 Long term (current) use of aspirin: Secondary | ICD-10-CM | POA: Diagnosis not present

## 2017-11-03 DIAGNOSIS — R296 Repeated falls: Secondary | ICD-10-CM | POA: Diagnosis present

## 2017-11-03 DIAGNOSIS — F0390 Unspecified dementia without behavioral disturbance: Secondary | ICD-10-CM | POA: Diagnosis present

## 2017-11-03 DIAGNOSIS — I498 Other specified cardiac arrhythmias: Secondary | ICD-10-CM

## 2017-11-03 DIAGNOSIS — Z419 Encounter for procedure for purposes other than remedying health state, unspecified: Secondary | ICD-10-CM

## 2017-11-03 HISTORY — DX: Chest pain, unspecified: R07.9

## 2017-11-03 HISTORY — DX: Syncope and collapse: R55

## 2017-11-03 HISTORY — PX: PACEMAKER IMPLANT: EP1218

## 2017-11-03 HISTORY — DX: Parkinson's disease: G20

## 2017-11-03 HISTORY — DX: Parkinson's disease without dyskinesia, without mention of fluctuations: G20.A1

## 2017-11-03 HISTORY — DX: Unsteadiness on feet: R26.81

## 2017-11-03 LAB — CBC
HCT: 37.5 % — ABNORMAL LOW (ref 40.0–52.0)
HEMATOCRIT: 42 % (ref 40.0–52.0)
HEMOGLOBIN: 14.5 g/dL (ref 13.0–18.0)
HEMOGLOBIN: 12.9 g/dL — AB (ref 13.0–18.0)
MCH: 31.1 pg (ref 26.0–34.0)
MCH: 31.5 pg (ref 26.0–34.0)
MCHC: 34.5 g/dL (ref 32.0–36.0)
MCHC: 34.4 g/dL (ref 32.0–36.0)
MCV: 90.2 fL (ref 80.0–100.0)
MCV: 91.5 fL (ref 80.0–100.0)
Platelets: 224 10*3/uL (ref 150–440)
Platelets: 226 10*3/uL (ref 150–440)
RBC: 4.65 MIL/uL (ref 4.40–5.90)
RBC: 4.09 MIL/uL — AB (ref 4.40–5.90)
RDW: 15 % — ABNORMAL HIGH (ref 11.5–14.5)
RDW: 15.3 % — ABNORMAL HIGH (ref 11.5–14.5)
WBC: 8.1 10*3/uL (ref 3.8–10.6)
WBC: 10.8 10*3/uL — ABNORMAL HIGH (ref 3.8–10.6)

## 2017-11-03 LAB — TYPE AND SCREEN
ABO/RH(D): O NEG
ANTIBODY SCREEN: NEGATIVE

## 2017-11-03 LAB — MRSA PCR SCREENING: MRSA BY PCR: NEGATIVE

## 2017-11-03 LAB — BASIC METABOLIC PANEL
ANION GAP: 6 (ref 5–15)
ANION GAP: 4 — AB (ref 5–15)
BUN: 20 mg/dL (ref 6–20)
BUN: 21 mg/dL — ABNORMAL HIGH (ref 6–20)
CALCIUM: 8 mg/dL — AB (ref 8.9–10.3)
CHLORIDE: 101 mmol/L (ref 101–111)
CO2: 25 mmol/L (ref 22–32)
CO2: 24 mmol/L (ref 22–32)
Calcium: 8.5 mg/dL — ABNORMAL LOW (ref 8.9–10.3)
Chloride: 103 mmol/L (ref 101–111)
Creatinine, Ser: 1.34 mg/dL — ABNORMAL HIGH (ref 0.61–1.24)
Creatinine, Ser: 1.44 mg/dL — ABNORMAL HIGH (ref 0.61–1.24)
GFR, EST AFRICAN AMERICAN: 51 mL/min — AB (ref >60)
GFR, EST AFRICAN AMERICAN: 47 mL/min — AB (ref >60)
GFR, EST NON AFRICAN AMERICAN: 44 mL/min — AB (ref >60)
GFR, EST NON AFRICAN AMERICAN: 40 mL/min — AB (ref >60)
Glucose, Bld: 109 mg/dL — ABNORMAL HIGH (ref 65–99)
Glucose, Bld: 126 mg/dL — ABNORMAL HIGH (ref 65–99)
POTASSIUM: 3.9 mmol/L (ref 3.5–5.1)
POTASSIUM: 4.3 mmol/L (ref 3.5–5.1)
SODIUM: 132 mmol/L — AB (ref 135–145)
Sodium: 131 mmol/L — ABNORMAL LOW (ref 135–145)

## 2017-11-03 LAB — ECHOCARDIOGRAM COMPLETE
HEIGHTINCHES: 70 in
WEIGHTICAEL: 3200 [oz_av]

## 2017-11-03 LAB — TROPONIN I: Troponin I: <0.03 ng/mL (ref <0.03)

## 2017-11-03 LAB — TSH: TSH: 2.864 u[IU]/mL (ref 0.350–4.500)

## 2017-11-03 SURGERY — PACEMAKER IMPLANT

## 2017-11-03 SURGERY — FIXATION, FRACTURE, INTERTROCHANTERIC, WITH INTRAMEDULLARY ROD
Anesthesia: Choice | Laterality: Left

## 2017-11-03 MED ORDER — ACETAMINOPHEN 325 MG PO TABS: 325.0000 mg | ORAL_TABLET | ORAL | Status: DC | PRN

## 2017-11-03 MED ORDER — SODIUM CHLORIDE 0.9 % IV SOLN
INTRAVENOUS | Status: AC
  Filled 2017-11-03: qty 2

## 2017-11-03 MED ORDER — PANTOPRAZOLE SODIUM 40 MG PO TBEC: 40.0000 mg | DELAYED_RELEASE_TABLET | Freq: Every day | ORAL | Status: DC

## 2017-11-03 MED ORDER — MOMETASONE FURO-FORMOTEROL FUM 200-5 MCG/ACT IN AERO
2.0000 | INHALATION_SPRAY | Freq: Two times a day (BID) | RESPIRATORY_TRACT | Status: DC
  Filled 2017-11-03: qty 8.8

## 2017-11-03 MED ORDER — FLUTICASONE PROPIONATE 50 MCG/ACT NA SUSP
1.0000 | Freq: Every day | NASAL | Status: DC
  Filled 2017-11-03: qty 16

## 2017-11-03 MED ORDER — AMLODIPINE BESYLATE 5 MG PO TABS: 2.5000 mg | ORAL_TABLET | Freq: Every day | ORAL | Status: DC

## 2017-11-03 MED ORDER — MORPHINE SULFATE (PF) 2 MG/ML IV SOLN: 2.0000 mg | INTRAVENOUS | Status: DC | PRN

## 2017-11-03 MED ORDER — FINASTERIDE 5 MG PO TABS: 5.0000 mg | ORAL_TABLET | Freq: Every day | ORAL | Status: DC

## 2017-11-03 MED ORDER — TAMSULOSIN HCL 0.4 MG PO CAPS: 0.4000 mg | ORAL_CAPSULE | Freq: Every day | ORAL | Status: DC

## 2017-11-03 MED ORDER — HYDROCODONE-ACETAMINOPHEN 5-325 MG PO TABS: 1.0000 | ORAL_TABLET | ORAL | Status: DC | PRN

## 2017-11-03 MED ORDER — POLYETHYLENE GLYCOL 3350 17 G PO PACK: 17.0000 g | PACK | Freq: Every day | ORAL | Status: DC | PRN

## 2017-11-03 MED ORDER — HYDROCODONE-ACETAMINOPHEN 5-325 MG PO TABS
1.0000 | ORAL_TABLET | ORAL | Status: DC | PRN
  Administered 2017-11-03: 1 via ORAL
  Filled 2017-11-03: qty 1

## 2017-11-03 MED ORDER — ONDANSETRON HCL 4 MG/2ML IJ SOLN
4.0000 mg | Freq: Once | INTRAMUSCULAR | Status: AC
  Administered 2017-11-03: 4 mg via INTRAVENOUS
  Filled 2017-11-03: qty 2

## 2017-11-03 MED ORDER — DOCUSATE SODIUM 100 MG PO CAPS: 100.0000 mg | ORAL_CAPSULE | Freq: Two times a day (BID) | ORAL | Status: DC

## 2017-11-03 MED ORDER — ACETAMINOPHEN 325 MG PO TABS: 650.0000 mg | ORAL_TABLET | Freq: Four times a day (QID) | ORAL | 0 refills | Status: DC | PRN

## 2017-11-03 MED ORDER — MORPHINE SULFATE (PF) 2 MG/ML IV SOLN
2.0000 mg | Freq: Once | INTRAVENOUS | Status: AC
  Administered 2017-11-03: 2 mg via INTRAVENOUS
  Filled 2017-11-03: qty 1

## 2017-11-03 MED ORDER — AMLODIPINE BESYLATE 5 MG PO TABS
2.5000 mg | ORAL_TABLET | Freq: Every day | ORAL | Status: DC
  Administered 2017-11-03: 2.5 mg via ORAL
  Filled 2017-11-03: qty 1

## 2017-11-03 MED ORDER — ONDANSETRON HCL 4 MG PO TABS
4.0000 mg | ORAL_TABLET | Freq: Four times a day (QID) | ORAL | Status: DC | PRN
Start: 2017-11-03 — End: 2017-11-03

## 2017-11-03 MED ORDER — SODIUM CHLORIDE 0.9 % IV SOLN
80.0000 mg | INTRAVENOUS | Status: AC
  Administered 2017-11-03: 80 mg

## 2017-11-03 MED ORDER — IOPAMIDOL (ISOVUE-370) INJECTION 76%
INTRAVENOUS | Status: AC
  Filled 2017-11-03: qty 50

## 2017-11-03 MED ORDER — CEFAZOLIN SODIUM-DEXTROSE 2-4 GM/100ML-% IV SOLN
2.0000 g | INTRAVENOUS | Status: AC
  Administered 2017-11-03: 2 g via INTRAVENOUS

## 2017-11-03 MED ORDER — HEPARIN (PORCINE) IN NACL 2-0.9 UNIT/ML-% IJ SOLN
INTRAMUSCULAR | Status: AC | PRN
  Administered 2017-11-03: 500 mL

## 2017-11-03 MED ORDER — IOPAMIDOL (ISOVUE-370) INJECTION 76%
INTRAVENOUS | Status: DC | PRN
  Administered 2017-11-03: 15 mL via INTRAVENOUS

## 2017-11-03 MED ORDER — SODIUM CHLORIDE 0.9% FLUSH
3.0000 mL | Freq: Two times a day (BID) | INTRAVENOUS | Status: DC
  Administered 2017-11-03 – 2017-11-07 (×5): 3 mL via INTRAVENOUS

## 2017-11-03 MED ORDER — SENNOSIDES-DOCUSATE SODIUM 8.6-50 MG PO TABS
1.0000 | ORAL_TABLET | Freq: Every evening | ORAL | Status: DC | PRN
Start: 2017-11-03 — End: 2017-11-06

## 2017-11-03 MED ORDER — ASPIRIN EC 81 MG PO TBEC: 81.0000 mg | DELAYED_RELEASE_TABLET | Freq: Every day | ORAL | Status: DC

## 2017-11-03 MED ORDER — FINASTERIDE 5 MG PO TABS
5.0000 mg | ORAL_TABLET | Freq: Every day | ORAL | Status: DC
  Administered 2017-11-05 – 2017-11-07 (×3): 5 mg via ORAL
  Filled 2017-11-03 (×3): qty 1

## 2017-11-03 MED ORDER — LIDOCAINE HCL (PF) 1 % IJ SOLN
INTRAMUSCULAR | Status: DC | PRN
  Administered 2017-11-03: 45 mL

## 2017-11-03 MED ORDER — LIDOCAINE HCL (PF) 1 % IJ SOLN
INTRAMUSCULAR | Status: AC
  Filled 2017-11-03: qty 30

## 2017-11-03 MED ORDER — ONDANSETRON HCL 4 MG/2ML IJ SOLN: 4.0000 mg | Freq: Four times a day (QID) | INTRAMUSCULAR | Status: DC | PRN

## 2017-11-03 MED ORDER — ACETAMINOPHEN 650 MG RE SUPP: 650.0000 mg | Freq: Four times a day (QID) | RECTAL | Status: DC | PRN

## 2017-11-03 MED ORDER — AMANTADINE HCL 100 MG PO CAPS
100.0000 mg | ORAL_CAPSULE | Freq: Two times a day (BID) | ORAL | Status: DC
  Administered 2017-11-03 – 2017-11-07 (×7): 100 mg via ORAL
  Filled 2017-11-03 (×9): qty 1

## 2017-11-03 MED ORDER — SERTRALINE HCL 100 MG PO TABS
100.0000 mg | ORAL_TABLET | Freq: Every day | ORAL | Status: DC
  Administered 2017-11-05: 100 mg via ORAL
  Filled 2017-11-03: qty 1

## 2017-11-03 MED ORDER — CEFAZOLIN SODIUM-DEXTROSE 2-4 GM/100ML-% IV SOLN: 2.0000 g | INTRAVENOUS | 0 refills | Status: DC

## 2017-11-03 MED ORDER — TRAZODONE HCL 50 MG PO TABS: 25.0000 mg | ORAL_TABLET | Freq: Every evening | ORAL | Status: DC | PRN

## 2017-11-03 MED ORDER — ACETAMINOPHEN 325 MG PO TABS: 650.0000 mg | ORAL_TABLET | Freq: Four times a day (QID) | ORAL | Status: DC | PRN

## 2017-11-03 MED ORDER — BISACODYL 10 MG RE SUPP: 10.0000 mg | Freq: Every day | RECTAL | Status: DC | PRN

## 2017-11-03 MED ORDER — SODIUM CHLORIDE 0.9 % IV SOLN
INTRAVENOUS | Status: DC
  Administered 2017-11-03: 23:00:00 via INTRAVENOUS

## 2017-11-03 MED ORDER — ONDANSETRON HCL 4 MG PO TABS: 4.0000 mg | ORAL_TABLET | Freq: Four times a day (QID) | ORAL | 0 refills | Status: DC | PRN

## 2017-11-03 MED ORDER — SODIUM CHLORIDE 0.9% FLUSH: 3.0000 mL | INTRAVENOUS | Status: DC | PRN

## 2017-11-03 MED ORDER — AMANTADINE HCL 100 MG PO CAPS
100.0000 mg | ORAL_CAPSULE | Freq: Two times a day (BID) | ORAL | Status: DC
  Filled 2017-11-03 (×2): qty 1

## 2017-11-03 MED ORDER — CEFAZOLIN SODIUM-DEXTROSE 2-4 GM/100ML-% IV SOLN
2.0000 g | INTRAVENOUS | Status: DC
  Filled 2017-11-03: qty 100

## 2017-11-03 MED ORDER — CEFAZOLIN SODIUM-DEXTROSE 2-4 GM/100ML-% IV SOLN
INTRAVENOUS | Status: AC
  Filled 2017-11-03: qty 100

## 2017-11-03 MED ORDER — SERTRALINE HCL 50 MG PO TABS: 25.0000 mg | ORAL_TABLET | Freq: Every day | ORAL | Status: DC

## 2017-11-03 MED ORDER — HEPARIN (PORCINE) IN NACL 2-0.9 UNIT/ML-% IJ SOLN
INTRAMUSCULAR | Status: AC
  Filled 2017-11-03: qty 500

## 2017-11-03 MED ORDER — TAMSULOSIN HCL 0.4 MG PO CAPS
0.4000 mg | ORAL_CAPSULE | Freq: Every day | ORAL | Status: DC
  Administered 2017-11-03 – 2017-11-06 (×4): 0.4 mg via ORAL
  Filled 2017-11-03 (×4): qty 1

## 2017-11-03 MED ORDER — SODIUM CHLORIDE 0.9 % IV SOLN: 250.0000 mL | INTRAVENOUS | Status: DC | PRN

## 2017-11-03 MED ORDER — CEFAZOLIN SODIUM-DEXTROSE 1-4 GM/50ML-% IV SOLN
1.0000 g | Freq: Four times a day (QID) | INTRAVENOUS | Status: AC
  Administered 2017-11-03 – 2017-11-04 (×2): 1 g via INTRAVENOUS
  Filled 2017-11-03 (×6): qty 50

## 2017-11-03 MED ORDER — MORPHINE SULFATE (PF) 4 MG/ML IV SOLN
4.0000 mg | Freq: Once | INTRAVENOUS | Status: AC
  Administered 2017-11-03: 4 mg via INTRAVENOUS
  Filled 2017-11-03: qty 1

## 2017-11-03 MED ORDER — DOCUSATE SODIUM 100 MG PO CAPS: 100.0000 mg | ORAL_CAPSULE | Freq: Two times a day (BID) | ORAL | 0 refills | Status: AC

## 2017-11-03 MED ORDER — BISACODYL 5 MG PO TBEC: 5.0000 mg | DELAYED_RELEASE_TABLET | Freq: Every day | ORAL | Status: DC | PRN

## 2017-11-03 MED ORDER — SODIUM CHLORIDE 0.9 % IV SOLN
Freq: Once | INTRAVENOUS | Status: AC
  Administered 2017-11-03: 06:00:00 via INTRAVENOUS

## 2017-11-03 MED ORDER — SODIUM CHLORIDE 0.9 % IV BOLUS
1000.0000 mL | Freq: Once | INTRAVENOUS | Status: AC
  Administered 2017-11-03: 1000 mL via INTRAVENOUS

## 2017-11-03 SURGICAL SUPPLY — 8 items
CABLE SURGICAL S-101-97-12 (CABLE) ×3 IMPLANT
HOVERMATT SINGLE USE (MISCELLANEOUS) ×3 IMPLANT
LEAD TENDRIL MRI 52CM LPA1200M (Lead) ×3 IMPLANT
LEAD TENDRIL MRI 58CM LPA1200M (Lead) ×3 IMPLANT
PACEMAKER ASSURITY DR-RF (Pacemaker) ×3 IMPLANT
PAD DEFIB LIFELINK (PAD) ×3 IMPLANT
SHEATH CLASSIC 8F (SHEATH) ×6 IMPLANT
TRAY PACEMAKER INSERTION (PACKS) ×3 IMPLANT

## 2017-11-03 NOTE — Clinical Social Work Placement (Signed)
   CLINICAL SOCIAL WORK PLACEMENT  NOTE  Date:  11/03/2017  Patient Details  Name: Justin Hendrix MRN: [id number] Date of Birth: 08/30/1922  Clinical Social Work is seeking post-discharge placement for this patient at the Skilled  Nursing Facility level of care (*CSW will initial, date and re-position this form in  chart as items are completed):  Yes   Patient/family provided with [facility] Clinical Social Work Department's list of facilities offering this level of care within the geographic area requested by the patient (or if unable, by the patient's family).  Yes   Patient/family informed of their freedom to choose among providers that offer the needed level of care, that participate in Medicare, Medicaid or managed care program needed by the patient, have an available bed and are willing to accept the patient.  Yes   Patient/family informed of [facility]'s ownership interest in [facility] and [facility], as well as of the fact that they are under no obligation to receive care at these facilities.  PASRR submitted to EDS on 11/03/17     PASRR number received on 11/03/17     Existing PASRR number confirmed on       FL2 transmitted to all facilities in geographic area requested by pt/family on 11/03/17     FL2 transmitted to all facilities within larger geographic area on       Patient informed that his/her managed care company has contracts with or will negotiate with certain facilities, including the following:            Patient/family informed of bed offers received.  Patient chooses bed at       Physician recommends and patient chooses bed at      Patient to be transferred to   on  .  Patient to be transferred to facility by       Patient family notified on   of transfer.  Name of family member notified:        PHYSICIAN       Additional Comment:    _______________________________________________ Ayan Yankey, Darleen CrockerBailey M, LCSW 11/03/2017, 8:13 AM

## 2017-11-03 NOTE — H&P (Signed)
History and Physical    Justin MusaCameron W Hendrix JXB:[id number] DOB: 12/29/1922 DOA: 11/03/2017   PCP: Jaclyn Shaggyate, Denny C, MD   Patient coming from:  Home    Chief Complaint: fall and CHB  HPI: Justin Hendrix is a 84 y.o. male with medical history significant for BPH, Parkinson's disease, prior history of TIA, CKD, hypertension, chronic prostatitis, initially brought [facility] emergency room [facility], with severe right hip pain, status post a mechanical fall, with subsequent comminuted displaced intratrochanteric right proximal femoral fracture.  History is obtained by chart, as the patient at this moment is unable to provide further history, level 5 caveat, due to recent sedation at the Cath Lab.  Also team was consulted, the patient was to undergo surgery, however he was found to be bradycardic, with heart rate in the 30s.  He was found to have complete heart block, cardiology evaluation was obtained, and the patient was [facility], for pacemaker placement, Dr. Johney FrameAllred.  The patient tolerated the procedure well, without complications, and his heart rate is now 94.  Per chart, it appears that the patient did have increased weakness, and dizziness.  He does not recall losing consciousness.  He was otherwise active until recently, ambulated with a walker or cane.  No apparent confusion.  Hospital course  BP (!) 170/110 (BP Location: Right Arm)   Pulse 94   Temp 97.9 F (36.6 C) (Oral)   Resp 18   Ht 6' (1.829 m)   Wt 93.5 kg (206 lb 2.1 oz)   SpO2 (!) 86%   BMI 27.96 kg/m   As mentioned above, he was found to have a right proximal femur fracture, and orthopedic patient was obtained, the patient is to undergo surgery once cleared from cardiology.  He tolerated the pacemaker placement well, without complications as mentioned above. Immobilized due to fracture in the right hip.   At [facility] the patient was started on Norvasc 2.5 mg, due to very elevated blood pressure.  Maximum  208/97, repeat 170/110 TN was within normal limits EKG prior to admission showed junctional rhythm with low rate between the 30s and 40s as mentioned above. Creatinine is 1.34, at baseline Chest x-ray NAD  Review of Systems:  As per HPI otherwise all other systems reviewed and are negative  Past Medical History:  Diagnosis Date  . Benign prostatic hypertrophy   . Chest pain    a. 04/2014 Echo: EF 50-55%, mild LVH, nl RV fxn, mild to mod AS, mild AI, mild TR; b. 04/2014 MV: nl EF. No ischemia.  . Chronic prostatitis   . Elevated PSA   . Incomplete bladder emptying   . Parkinson's disease (HCC)   . Pre-syncope   . TIA (transient ischemic attack)   . Unsteady gait   . Urinary frequency     No past surgical history on file.  Social History Social History   Socioeconomic History  . Marital status: Widowed    Spouse name: Not on file  . Number of children: Not on file  . Years of education: Not on file  . Highest education level: Not on file  Occupational History  . Not on file  Social Needs  . Financial resource strain: Not on file  . Food insecurity:    Worry: Not on file    Inability: Not on file  . Transportation needs:    Medical: Not on file    Non-medical: Not on file  Tobacco Use  . Smoking  status: Former Games developer  . Smokeless tobacco: Current User    Types: Chew  . Tobacco comment: smoked for a few yrs while in the [employer]  Substance and Sexual Activity  . Alcohol use: No  . Drug use: No  . Sexual activity: Not on file  Lifestyle  . Physical activity:    Days per week: Not on file    Minutes per session: Not on file  . Stress: Not on file  Relationships  . Social connections:    Talks on phone: Not on file    Gets together: Not on file    Attends religious service: Not on file    Active member of club or organization: Not on file    Attends meetings of clubs or organizations: Not on file    Relationship status: Not on file  . Intimate partner violence:     Fear of current or ex partner: Not on file    Emotionally abused: Not on file    Physically abused: Not on file    Forced sexual activity: Not on file  Other Topics Concern  . Not on file  Social History Narrative   Lives in [place] with 2 sons ("they live with me!").  Unsteady gait.  Uses walker/cane to get around.  Does not routinely exercise.     No Known Allergies  Family History  Problem Relation Age of Onset  . Hypertension Father       Prior to Admission medications   Medication Sig Start Date End Date Taking? Authorizing Provider  acetaminophen (TYLENOL) 325 MG tablet Take 2 tablets (650 mg total) by mouth every 6 (six) hours as needed for mild pain (or Fever >/= 101). 11/03/17   Altamese Dilling, MD  amantadine (SYMMETREL) 100 MG capsule Take 100 mg by mouth 2 (two) times daily.    [provider]  aspirin 81 MG tablet Take 81 mg by mouth daily.    [provider]  budesonide-formoterol (SYMBICORT) 160-4.5 MCG/ACT inhaler Inhale 2 puffs into the lungs 2 (two) times daily.    [provider]  ceFAZolin (ANCEF) 2-4 GM/100ML-% IVPB Inject 100 mLs (2 g total) into the vein to Surgery for 1 dose. 11/03/17 11/03/17  Altamese Dilling, MD  docusate sodium (COLACE) 100 MG capsule Take 1 capsule (100 mg total) by mouth 2 (two) times daily. 11/03/17   Altamese Dilling, MD  finasteride (PROSCAR) 5 MG tablet Take 5 mg by mouth daily.  04/04/16   [provider]  fluticasone (FLONASE) 50 MCG/ACT nasal spray 2 sprays by Each Nare route daily. as needed    [provider]  ondansetron (ZOFRAN) 4 MG tablet Take 1 tablet (4 mg total) by mouth every 6 (six) hours as needed for nausea. 11/03/17   Altamese Dilling, MD  pantoprazole (PROTONIX) 40 MG tablet Take 40 mg by mouth daily.    [provider]  sertraline (ZOLOFT) 100 MG tablet Take 100 mg by mouth daily.  05/09/16   [provider]  tamsulosin (FLOMAX) 0.4  MG CAPS capsule Take 0.4 mg by mouth daily.  06/17/15   [provider]    Physical Exam:  Vitals:   11/03/17 1630 11/03/17 1635 11/03/17 1637 11/03/17 1658  BP: (!) 152/101 (!) 162/102  (!) 170/110  Pulse: 96 99  94  Resp: 12 11  18   Temp:    97.9 F (36.6 C)  TempSrc:    Oral  SpO2: 90% 94% 94% (!) 86%  Weight:  93.5 kg (206 lb 2.1 oz)  Height:    6' (1.829 m)   Constitutional: NAD, very lethargic at this time, just arrived from Cath Lab. Eyes: PERRL, lids and conjunctivae normal ENMT: Mucous membranes are dry, without exudate or lesions.  Several missing teeth. Neck: normal, supple, no masses, no thyromegaly Respiratory: clear to auscultation bilaterally, no wheezing, no crackles. Normal respiratory effort  Cardiovascular: Regular rate and rhythm,  murmur, rubs or gallops pacemaker in place on the left chest. No extremity edema. 2+ pedal pulses. No carotid bruits.  Abdomen: Soft, non tender, No hepatosplenomegaly. Bowel sounds positive.  Musculoskeletal: no clubbing / cyanosis.  Right lower extremity externally rotated, shortened, significant pain on palpation, unable to cooperate in the exam, due to lethargy. Skin: no jaundice, No lesions.  Neurologic: Sensation intact  Strength unable to be assessed, as the patient cannot participate in the exam.   Psychiatric:   The patient is lethargic, post anesthesia.  x 3.     Labs on Admission: I have personally reviewed following labs and imaging studies  CBC: Recent Labs  Lab 11/03/17 0124 11/03/17 0711  WBC 8.1 10.8*  HGB 14.5 12.9*  HCT 42.0 37.5*  MCV 90.2 91.5  PLT 224 226    Basic Metabolic Panel: Recent Labs  Lab 11/03/17 0124 11/03/17 0711  NA 132* 131*  K 3.9 4.3  CL 101 103  CO2 25 24  GLUCOSE 109* 126*  BUN 20 21*  CREATININE 1.34* 1.44*  CALCIUM 8.5* 8.0*    GFR: Estimated Creatinine Clearance: 38.1 mL/min (A) (by C-G formula based on SCr of 1.44 mg/dL (H)).  Liver Function Tests: No  results for input(s): AST, ALT, ALKPHOS, BILITOT, PROT, ALBUMIN in the last 168 hours. No results for input(s): LIPASE, AMYLASE in the last 168 hours. No results for input(s): AMMONIA in the last 168 hours.  Coagulation Profile: No results for input(s): INR, PROTIME in the last 168 hours.  Cardiac Enzymes: Recent Labs  Lab 11/03/17 0124  TROPONINI <0.03    BNP (last 3 results) No results for input(s): PROBNP in the last 8760 hours.  HbA1C: No results for input(s): HGBA1C in the last 72 hours.  CBG: No results for input(s): GLUCAP in the last 168 hours.  Lipid Profile: No results for input(s): CHOL, HDL, LDLCALC, TRIG, CHOLHDL, LDLDIRECT in the last 72 hours.  Thyroid Function Tests: Recent Labs    11/03/17 1029  TSH 2.864    Anemia Panel: No results for input(s): VITAMINB12, FOLATE, FERRITIN, TIBC, IRON, RETICCTPCT in the last 72 hours.  Urine analysis: No results found for: COLORURINE, APPEARANCEUR, LABSPEC, PHURINE, GLUCOSEU, HGBUR, BILIRUBINUR, KETONESUR, PROTEINUR, UROBILINOGEN, NITRITE, LEUKOCYTESUR  Sepsis Labs: @LABRCNTIP (procalcitonin:4,lacticidven:4) ) Recent Results (from the past 240 hour(s))  MRSA PCR Screening     Status: None   Collection Time: 11/03/17  5:41 AM  Result Value Ref Range Status   MRSA by PCR NEGATIVE NEGATIVE Final    Comment:        The GeneXpert MRSA Assay (FDA approved for NASAL specimens only), is one component of a comprehensive MRSA colonization surveillance program. It is not intended to diagnose MRSA infection nor to guide or monitor treatment for MRSA infections. Performed at [facility], [address] [place], [place] [address]      Radiological Exams on Admission: Dg Chest [facility] 1 View  Result Date: 11/03/2017 CLINICAL DATA:  Fall.  Preop. EXAM: PORTABLE CHEST 1 VIEW COMPARISON:  Radiograph 09/15/2014 FINDINGS: Stable cardiomegaly and mediastinal contours.  Aortic atherosclerosis. Probable retrocardiac  hiatal hernia. No consolidation, pleural effusion or pneumothorax. No acute osseous abnormalities are seen. IMPRESSION: 1. No acute abnormality. 2. Stable cardiomegaly with aortic atherosclerosis. 3. Probable retrocardiac hiatal hernia. Electronically Signed   By: Rubye Oaks M.D.   On: 11/03/2017 02:40   Dg Hip Unilat W Or Wo Pelvis 2-3 Views Right  Result Date: 11/03/2017 CLINICAL DATA:  Fall while walking dog.  Right hip pain. EXAM: DG HIP (WITH OR WITHOUT PELVIS) 2-3V RIGHT COMPARISON:  None. FINDINGS: Comminuted displaced intertrochanteric right hip fracture. There is mild apex lateral angulation of dominant fracture fragments. Displacement involves the lesser trochanter. Femoral head remains located. Pubic rami are intact. Pubic symphysis and sacroiliac joints are congruent. IMPRESSION: Comminuted displaced intertrochanteric right proximal femur fracture. Electronically Signed   By: Rubye Oaks M.D.   On: 11/03/2017 02:41    EKG: Independently reviewed.  Assessment/Plan Principal Problem:   Complete heart block (HCC) Active Problems:   S/P right hip fracture   Chronic fatigue   Parkinson's disease (HCC)   Chronic prostatitis   Incomplete emptying of bladder   History of TIA (transient ischemic attack)    Complete heart block, EKG prior to admission showed junctional rhythm with low rate between the 30s and 40s as mentioned above patient transferred to [facility], where he underwent a pacemaker insertion, Dr. Johney Frame, which was successful.  No complications noted.  2D echo today, 11/03/2017, EF 60-65%, normal systolic, grade 1 diastolic We will hold heparin Per recommendations as per cardiology, appreciate their involvement   Right hip fracture,he was found to have a right proximal femur fracture, and orthopedic patient was obtained, the patient is to undergo surgery once cleared from cardiology. Patient continues to be immobilized SCDs Pain control and other conditions as  per orthopedics, appreciate their involvement N.p.o. after midnight EKG in a.m.   Hypertension BP  170/110  At [facility] the patient was started on Norvasc 2.5 mg, due to very elevated blood pressure.  Maximum 208/97, repeat 170/110 Continue home anti-hypertensive medications as per Cards recommendations   Parkinsons disease Continue Zoloft and Symmetrel   Benign prostatic hypertrophy, chronic prostatitis  no acute issues Continue Flomax and proscar   Chronic kidney disease stage 2-3    baseline creatinine 1.1    Current Cr is 1.3-1.4  Lab Results  Component Value Date   CREATININE 1.44 (H) 11/03/2017   CREATININE 1.34 (H) 11/03/2017  Gentle IVF  Repeat BMET in am  Hold NSAIDS   DVT prophylaxis:  SCD  Code Status:    Full  Family Communication:  Discussed with patient Disposition Plan: Expect patient to be discharged to home after condition improves Consults called:    Cardiology, Dr. Johney Frame, Ortho has been called  Admission status:  IP Tele    Marlowe Kays, PA-C [facility]   Amion text  [phone]   11/03/2017, 5:34 PM

## 2017-11-03 NOTE — H&P (View-Only) (Signed)
   ORTHOPAEDIC CONSULTATION  REQUESTING PHYSICIAN: Abrol, Nayana, MD  Chief Complaint: R hip fracture  HPI: [place] is a 84 y.o. male with  Transfer from [facility] for cardiac pacemaker placement.  Patient sustained ground level fall and has right displaced intertrochanteric femur fracture.  Primary history gathered from family as patient has moderate dementia.  Patient reportedly per medical team clear for surgery tomorrow and orthopedics consulted.    Past Medical History:  Diagnosis Date  . Benign prostatic hypertrophy   . Chest pain    a. 04/2014 Echo: EF 50-55%, mild LVH, nl RV fxn, mild to mod AS, mild AI, mild TR; b. 04/2014 MV: nl EF. No ischemia.  . Chronic prostatitis   . Elevated PSA   . Incomplete bladder emptying   . Parkinson's disease (HCC)   . Pre-syncope   . TIA (transient ischemic attack)   . Unsteady gait   . Urinary frequency    No past surgical history on file. Social History   Socioeconomic History  . Marital status: Widowed    Spouse name: Not on file  . Number of children: Not on file  . Years of education: Not on file  . Highest education level: Not on file  Occupational History  . Not on file  Social Needs  . Financial resource strain: Not on file  . Food insecurity:    Worry: Not on file    Inability: Not on file  . Transportation needs:    Medical: Not on file    Non-medical: Not on file  Tobacco Use  . Smoking status: Former Smoker  . Smokeless tobacco: Current User    Types: Chew  . Tobacco comment: smoked for a few yrs while in the [employer]  Substance and Sexual Activity  . Alcohol use: No  . Drug use: No  . Sexual activity: Not on file  Lifestyle  . Physical activity:    Days per week: Not on file    Minutes per session: Not on file  . Stress: Not on file  Relationships  . Social connections:    Talks on phone: Not on file    Gets together: Not on file    Attends religious service: Not on file    Active member of  club or organization: Not on file    Attends meetings of clubs or organizations: Not on file    Relationship status: Not on file  Other Topics Concern  . Not on file  Social History Narrative   Lives in [place] with 2 sons ("they live with me!").  Unsteady gait.  Uses walker/cane to get around.  Does not routinely exercise.   Family History  Problem Relation Age of Onset  . Hypertension Father    No Known Allergies Prior to Admission medications   Medication Sig Start Date End Date Taking? Authorizing Provider  acetaminophen (TYLENOL) 325 MG tablet Take 2 tablets (650 mg total) by mouth every 6 (six) hours as needed for mild pain (or Fever >/= 101). 11/03/17   Vachhani, Vaibhavkumar, MD  amantadine (SYMMETREL) 100 MG capsule Take 100 mg by mouth 2 (two) times daily.    [provider]  aspirin 81 MG tablet Take 81 mg by mouth daily.    [provider]  budesonide-formoterol (SYMBICORT) 160-4.5 MCG/ACT inhaler Inhale 2 puffs into the lungs 2 (two) times daily.    [provider]  ceFAZolin (ANCEF) 2-4 GM/100ML-% IVPB Inject 100 mLs (2 g total) into the   vein to Surgery for 1 dose. 11/03/17 11/03/17  Vachhani, Vaibhavkumar, MD  docusate sodium (COLACE) 100 MG capsule Take 1 capsule (100 mg total) by mouth 2 (two) times daily. 11/03/17   Vachhani, Vaibhavkumar, MD  finasteride (PROSCAR) 5 MG tablet Take 5 mg by mouth daily.  04/04/16   [provider]  fluticasone (FLONASE) 50 MCG/ACT nasal spray 2 sprays by Each Nare route daily. as needed    [provider]  ondansetron (ZOFRAN) 4 MG tablet Take 1 tablet (4 mg total) by mouth every 6 (six) hours as needed for nausea. 11/03/17   Vachhani, Vaibhavkumar, MD  pantoprazole (PROTONIX) 40 MG tablet Take 40 mg by mouth daily.    [provider]  sertraline (ZOLOFT) 100 MG tablet Take 100 mg by mouth daily.  05/09/16   [provider]  tamsulosin (FLOMAX) 0.4 MG CAPS capsule Take 0.4 mg by mouth  daily.  06/17/15   [provider]   Dg Chest Port 1 View  Result Date: 11/03/2017 CLINICAL DATA:  Fall.  Preop. EXAM: PORTABLE CHEST 1 VIEW COMPARISON:  Radiograph 09/15/2014 FINDINGS: Stable cardiomegaly and mediastinal contours. Aortic atherosclerosis. Probable retrocardiac hiatal hernia. No consolidation, pleural effusion or pneumothorax. No acute osseous abnormalities are seen. IMPRESSION: 1. No acute abnormality. 2. Stable cardiomegaly with aortic atherosclerosis. 3. Probable retrocardiac hiatal hernia. Electronically Signed   By: Melanie  Ehinger M.D.   On: 11/03/2017 02:40   Dg Hip Unilat W Or Wo Pelvis 2-3 Views Right  Result Date: 11/03/2017 CLINICAL DATA:  Fall while walking dog.  Right hip pain. EXAM: DG HIP (WITH OR WITHOUT PELVIS) 2-3V RIGHT COMPARISON:  None. FINDINGS: Comminuted displaced intertrochanteric right hip fracture. There is mild apex lateral angulation of dominant fracture fragments. Displacement involves the lesser trochanter. Femoral head remains located. Pubic rami are intact. Pubic symphysis and sacroiliac joints are congruent. IMPRESSION: Comminuted displaced intertrochanteric right proximal femur fracture. Electronically Signed   By: Melanie  Ehinger M.D.   On: 11/03/2017 02:41   Family History Reviewed and non-contributory, no pertinent history of problems with bleeding or anesthesia     Review of Systems 14 system ROS conducted and negative except for that noted in HPI   OBJECTIVE  Vitals: Patient Vitals for the past 8 hrs:  BP Temp Temp src Pulse Resp SpO2 Height Weight  11/03/17 1658 (!) 170/110 97.9 F (36.6 C) Oral 94 18 (!) 86 % 6' (1.829 m) 206 lb 2.1 oz (93.5 kg)  11/03/17 1637 - - - - - 94 % - -  11/03/17 1635 (!) 162/102 - - 99 11 94 % - -  11/03/17 1630 (!) 152/101 - - 96 12 90 % - -  11/03/17 1625 133/89 - - 100 (!) 22 95 % - -  11/03/17 1620 (!) 154/78 - - (!) 47 16 95 % - -  11/03/17 1616 (!) 151/77 - - (!) 40 14 96 % - -    11/03/17 1611 (!) 144/72 - - (!) 42 (!) 23 93 % - -  11/03/17 1606 (!) 152/75 - - (!) 43 (!) 25 92 % - -  11/03/17 1601 138/77 - - (!) 51 18 95 % - -  11/03/17 1556 131/74 - - (!) 48 20 96 % - -  11/03/17 1551 (!) 142/74 - - (!) 57 15 96 % - -  11/03/17 1546 (!) 156/81 - - 77 (!) 64 97 % - -  11/03/17 1546 - - - - - 96 % - -    11/03/17 1541 (!) 159/79 - - (!) 47 17 (!) 0 % - -  11/03/17 1541 - - - (!) 47 15 (!) 0 % - -  11/03/17 1536 - - - (!) 0 (!) 22 (!) 89 % - -  11/03/17 1531 - - - (!) 0 (!) 0 (!) 0 % - -  11/03/17 1530 (!) 149/71 - - (!) 50 15 96 % - -  11/03/17 1527 - - - (!) 0 (!) 0 (!) 0 % - -   General: Alert, no acute distress Cardiovascular: No pedal edema Respiratory: No cyanosis, no use of accessory musculature GI: No organomegaly, abdomen is soft and non-tender Skin: No lesions in the area of chief complaint other than those listed below in MSK exam.  Neurologic: Sensation intact distally save for the below mentioned MSK exam Psychiatric: Patient is competent for consent with normal mood and affect Lymphatic: No axillary or cervical lymphadenopathy Extremities  RLE: Shortened and externally rotated.  ROM deferred. + GS/TA/EHL. Sensation intact in DP/SP/S/S/P distributions. 2+ DP pulse with warm and well perfused digits. Compartments soft and compressible, with no pain on passive stretch.   Test Results Imaging R displaced 3 part intertrochanteric femur fracture noted.  Labs cbc Recent Labs    11/03/17 0124 11/03/17 0711  WBC 8.1 10.8*  HGB 14.5 12.9*  HCT 42.0 37.5*  PLT 224 226    Labs inflam No results for input(s): CRP in the last 72 hours.  Invalid input(s): ESR  Labs coag No results for input(s): INR, PTT in the last 72 hours.  Invalid input(s): PT  Recent Labs    11/03/17 0124 11/03/17 0711  NA 132* 131*  K 3.9 4.3  CL 101 103  CO2 25 24  GLUCOSE 109* 126*  BUN 20 21*  CREATININE 1.34* 1.44*  CALCIUM 8.5* 8.0*     ASSESSMENT AND  PLAN: 84 y.o. male with the following: R displaced intertrochanteric femur fracture  Discussed options and non-operative versus operative measures.  Non-operative measures have a predictably poor set of outcomes in an ambulatory patient including bedsores and other medical complications.  Understanding this the patient/family elected to proceed with operative measures.  The risks and benefits of  surgical intervention including infection, bleeding, nerve injury, periprosthetic fracture, the need for revision surgery, leg length discrepancy, gait change, blood clots, cardiopulmonary complications, morbidity, mortality, among others, and they were willing to proceed.    Plan for OR 4/12 depending on OR availability. Discussed case and specifics with patient and his son's Jerry and Bob.    

## 2017-11-03 NOTE — Progress Notes (Signed)
MD Elisabeth PigeonVachhani made aware of plan that per cardiologist, [facility].

## 2017-11-03 NOTE — Clinical Social Work Note (Signed)
Clinical Social Work Assessment  Patient Details  Name: Justin Hendrix MRN: [id number] Date of Birth: Jun 06, 1923  Date of referral:  11/03/17               Reason for consult:  Facility Placement                Permission sought to share information with:  Chartered certified accountant granted to share information::  Yes, Verbal Permission Granted  Name::      [place]::   [place]   Relationship::     Contact Information:     Housing/Transportation Living arrangements for the past 2 months:  [place] of Information:  Patient Patient Interpreter Needed:  None Criminal Activity/Legal Involvement Pertinent to Current Situation/Hospitalization:  No - Comment as needed Significant Relationships:  Adult Children Lives with:  Adult Children Do you feel safe going back to the place where you live?  Yes Need for family participation in patient care:  Yes (Comment)  Care giving concerns:  Patient lives in [place] with his 2 adult sons Justin [place] and [place].    Social Worker assessment / plan:  Holiday representative ([place]) reviewed chart and noted that patient has a femur fracture, surgery and PT are pending. CSW met with patient alone at bedside to discuss D/C plan. Patient was alert and oriented X3 and was laying in the bed. CSW introduced self and explained role of CSW department. Per patient he lives in [place] with his 2 sons and his son Justin Hendrix is HPOA. CSW explained that PT will evaluate patient and make a recommendation of home health or SNF. CSW explained SNF process and that [place] will have to approve it. Patient reported that he prefers to go home but is open to SNF if needed. Patient is agreeable to SNF search in [place]. FL2 complete and faxed out. CSW will continue to follow and assist as needed.   Employment status:  Retired Nurse, adult PT Recommendations:  Not assessed at this  time Information / Referral to community resources:  [place]  Patient/Family's Response to care:  Patient prefers to go home but is open to SNF if needed.   Patient/Family's Understanding of and Emotional Response to Diagnosis, Current Treatment, and Prognosis:  Patient understands that he is waiting on cardiology clearance for surgery.   Emotional Assessment Appearance:  Appears stated age Attitude/Demeanor/Rapport:    Affect (typically observed):  Accepting, Adaptable, Pleasant Orientation:  Oriented to Self, Oriented to Place, Oriented to  Time, Oriented to Situation Alcohol / Substance use:  Not Applicable Psych involvement (Current and /or in the community):  No (Comment)  Discharge Needs  Concerns to be addressed:  Discharge Planning Concerns Readmission within the last 30 days:  No Current discharge risk:  Dependent with Mobility Barriers to Discharge:  Continued Medical Work up   [employer], [place] Beets, LCSW 11/03/2017, 8:14 AM

## 2017-11-03 NOTE — [place] FL2 (Signed)
MEDICAID FL2 LEVEL OF CARE SCREENING TOOL     IDENTIFICATION  Patient Name: Justin MusaCameron W Wamble Birthdate: 06/07/1923 Sex: male Admission Date (Current Location): 11/03/2017  [place]   Facility and Address:  [facility], [address], [place], [place] [address]      Provider Number: [id number]  Attending Physician Name and Address:  Altamese DillingVachhani, Vaibhavkumar, *  Relative Name and Phone Number:       Current Level of Care: Hospital Recommended Level of Care: Skilled Nursing Facility Prior Approval Number:    Date Approved/Denied:   PASRR Number: [id number])  Discharge Plan: SNF    Current Diagnoses: Patient Active Problem List   Diagnosis Date Noted  . Chest pain at rest 05/14/2014  . SOB (shortness of breath) 05/14/2014  . Chronic fatigue 05/14/2014  . Leg weakness, bilateral 05/14/2014  . Urinary urgency 05/14/2014  . Benign localized prostatic hyperplasia with lower urinary tract symptoms (LUTS) 05/14/2014  . Parkinson's disease (HCC) 05/14/2014  . Chronic prostatitis 02/14/2013  . Elevated prostate specific antigen (PSA) 02/14/2013  . Incomplete emptying of bladder 02/14/2013  . Increased frequency of urination 02/14/2013    Orientation RESPIRATION BLADDER Height & Weight     Self, Time, Place, Situation  Normal Continent Weight: 200 lb (90.7 kg) Height:  5\' 10"  (177.8 cm)  BEHAVIORAL SYMPTOMS/MOOD NEUROLOGICAL BOWEL NUTRITION STATUS      Continent Diet(NPO for surgery. )  AMBULATORY STATUS COMMUNICATION OF NEEDS Skin   Extensive Assist Verbally Surgical wounds                       Personal Care Assistance Level of Assistance  Bathing, Feeding, Dressing Bathing Assistance: Limited assistance Feeding assistance: Independent Dressing Assistance: Limited assistance     Functional Limitations Info  Sight, Hearing, Speech Sight Info: Adequate Hearing Info: Impaired Speech Info:  Adequate    SPECIAL CARE FACTORS FREQUENCY  PT (By licensed PT), OT (By licensed OT)     PT Frequency: (5) OT Frequency: (5)            Contractures      Additional Factors Info  Code Status, Allergies Code Status Info: (Full Code. ) Allergies Info: (No Known Allergies. )           Current Medications (11/03/2017):  This is the current hospital active medication list Current Facility-Administered Medications  Medication Dose Route Frequency Provider Last Rate Last Dose  . acetaminophen (TYLENOL) tablet 650 mg  650 mg Oral Q6H PRN Cammy CopaMaier, Angela, MD       Or  . acetaminophen (TYLENOL) suppository 650 mg  650 mg Rectal Q6H PRN Cammy CopaMaier, Angela, MD      . amantadine (SYMMETREL) capsule 100 mg  100 mg Oral BID Cammy CopaMaier, Angela, MD      . amLODipine (NORVASC) tablet 2.5 mg  2.5 mg Oral Daily Cammy CopaMaier, Angela, MD   2.5 mg at 11/03/17 [id number]  . aspirin EC tablet 81 mg  81 mg Oral Daily Cammy CopaMaier, Angela, MD      . bisacodyl (DULCOLAX) EC tablet 5 mg  5 mg Oral Daily PRN Cammy CopaMaier, Angela, MD      . ceFAZolin (ANCEF) IVPB 2g/100 mL premix  2 g Intravenous To OR Hooten, Illene LabradorJames P, MD      . docusate sodium (COLACE) capsule 100 mg  100 mg Oral BID Cammy CopaMaier, Angela, MD      . finasteride (PROSCAR) tablet 5 mg  5  mg Oral Daily Cammy Copa, MD      . fluticasone Aleda Grana) 50 MCG/ACT nasal spray 1 spray  1 spray Each Nare Daily Cammy Copa, MD      . HYDROcodone-acetaminophen (NORCO/VICODIN) 5-325 MG per tablet 1-2 tablet  1-2 tablet Oral Q4H PRN Cammy Copa, MD      . mometasone-formoterol [facility]) 200-5 MCG/ACT inhaler 2 puff  2 puff Inhalation BID Cammy Copa, MD      . ondansetron [facility]) tablet 4 mg  4 mg Oral Q6H PRN Cammy Copa, MD       Or  . ondansetron [facility]) injection 4 mg  4 mg Intravenous Q6H PRN Cammy Copa, MD      . pantoprazole (PROTONIX) EC tablet 40 mg  40 mg Oral Daily Cammy Copa, MD      . sertraline (ZOLOFT) tablet 25 mg  25 mg Oral Daily Cammy Copa, MD      .  tamsulosin [facility]) capsule 0.4 mg  0.4 mg Oral Daily Cammy Copa, MD      . traZODone (DESYREL) tablet 25 mg  25 mg Oral QHS PRN Cammy Copa, MD         Discharge Medications: Please see discharge summary for a list of discharge medications.  Relevant Imaging Results:  Relevant Lab Results:   Additional Information (SSN: [id number])  Jacie Tristan, Darleen Crocker, LCSW

## 2017-11-03 NOTE — Care Management (Addendum)
Home health list left at bedside with son and patient that is pending surgical clearance. MD at bedside.  RNCM will follow. Per RN note: [facility].

## 2017-11-03 NOTE — ED Triage Notes (Signed)
Pt arrived via EMS from home with complaints of a fall. Pt was walking his dog when he got tangled in the dog's leash. Medications at bedside. Pt denied LOC and did not hit his head. Pt has right hip pain, leg is shortened. Pt has 10 out of 10 pain. Pt per EMS is bradycardic at 40.  EMS reported that yesterday his HR was reported to be 64 by the home health nurse. VS per EMS BP-185/90 HR-40. Pt has SOB. EMS placed a 20 on left AC. Pt lives alone at home. Pt is alert and oriented x 4.

## 2017-11-03 NOTE — Progress Notes (Signed)
Talked with Dr. Caryn BeeMaier about patients low HR. She said it was OK to go to medical floor with cardiac monitoring. Shared the info with RN on 1A.

## 2017-11-03 NOTE — H&P (View-Only) (Signed)
ELECTROPHYSIOLOGY CONSULT NOTE    Patient ID: Justin Hendrix MRN: [id number], DOB/AGE: 82/01/1923 82 y.o.  Admit date: 11/03/2017 Date of Consult: 11/03/2017  Primary Physician: Jaclyn Shaggyate, Denny C, MD Primary Cardiologist: Dr Mariah MillingGollan  Patient Profile: Justin Hendrix is a 84 y.o. male with a history of TIA, parkisons disease, and unsteadiness who is being seen today for the evaluation of complete heart block at the request of Dr Mariah MillingGollan.  HPI:  Justin Hendrix is a 84 y.o. male admitted [facility] after fall 11/03/17.  He reports being in good health and living at home with his sons.  He is not very active but does ambulate with a walker or cane.  He has had intermittent episodes of dizziness over the past 2 months.  Episodes leave him feeling "weak". Early this am, he had a similar spell of abrupt and transient weakness during which he fell and broke his hip.  He does not recall losing consciousness.  Upon EMS arrival, he was found to have complete heart block.  He had been transferred by the hospitalists [facility] at the request of Dr Mariah Millinggollan.  He denies chest pain, palpitations, dyspnea, PND, orthopnea, nausea, vomiting,syncope, edema, weight gain, or early satiety.  Past Medical History:  Diagnosis Date  . Benign prostatic hypertrophy   . Chest pain    a. 04/2014 Echo: EF 50-55%, mild LVH, nl RV fxn, mild to mod AS, mild AI, mild TR; b. 04/2014 MV: nl EF. No ischemia.  . Chronic prostatitis   . Elevated PSA   . Incomplete bladder emptying   . Parkinson's disease (HCC)   . Pre-syncope   . TIA (transient ischemic attack)   . Unsteady gait   . Urinary frequency      Surgical History: No past surgical history on file.   Medications Prior to Admission  Medication Sig Dispense Refill Last Dose  . acetaminophen (TYLENOL) 325 MG tablet Take 2 tablets (650 mg total) by mouth every 6 (six) hours as needed for mild pain (or Fever >/= 101). 20 tablet 0   .  amantadine (SYMMETREL) 100 MG capsule Take 100 mg by mouth 2 (two) times daily.   Past Week at unknown  . aspirin 81 MG tablet Take 81 mg by mouth daily.   Past Week at Unknown time  . budesonide-formoterol (SYMBICORT) 160-4.5 MCG/ACT inhaler Inhale 2 puffs into the lungs 2 (two) times daily.   prn at prn  . ceFAZolin (ANCEF) 2-4 GM/100ML-% IVPB Inject 100 mLs (2 g total) into the vein to Surgery for 1 dose. 1 each 0   . docusate sodium (COLACE) 100 MG capsule Take 1 capsule (100 mg total) by mouth 2 (two) times daily. 10 capsule 0   . finasteride (PROSCAR) 5 MG tablet Take 5 mg by mouth daily.    unknown at unknown  . fluticasone (FLONASE) 50 MCG/ACT nasal spray 2 sprays by Each Nare route daily. as needed   prn at prn  . ondansetron (ZOFRAN) 4 MG tablet Take 1 tablet (4 mg total) by mouth every 6 (six) hours as needed for nausea. 20 tablet 0   . pantoprazole (PROTONIX) 40 MG tablet Take 40 mg by mouth daily.   Past Week at unknown  . sertraline (ZOLOFT) 100 MG tablet Take 100 mg by mouth daily.    Past Week at unknown  . tamsulosin (FLOMAX) 0.4 MG CAPS capsule Take 0.4 mg by mouth daily.    Past Week  at unknown    Inpatient Medications:   Allergies: No Known Allergies  Social History   Socioeconomic History  . Marital status: Widowed    Spouse name: Not on file  . Number of children: Not on file  . Years of education: Not on file  . Highest education level: Not on file  Occupational History  . Not on file  Social Needs  . Financial resource strain: Not on file  . Food insecurity:    Worry: Not on file    Inability: Not on file  . Transportation needs:    Medical: Not on file    Non-medical: Not on file  Tobacco Use  . Smoking status: Former Games developer  . Smokeless tobacco: Current User    Types: Chew  . Tobacco comment: smoked for a few yrs while in the [employer]  Substance and Sexual Activity  . Alcohol use: No  . Drug use: No  . Sexual activity: Not on file  Lifestyle  .  Physical activity:    Days per week: Not on file    Minutes per session: Not on file  . Stress: Not on file  Relationships  . Social connections:    Talks on phone: Not on file    Gets together: Not on file    Attends religious service: Not on file    Active member of club or organization: Not on file    Attends meetings of clubs or organizations: Not on file    Relationship status: Not on file  . Intimate partner violence:    Fear of current or ex partner: Not on file    Emotionally abused: Not on file    Physically abused: Not on file    Forced sexual activity: Not on file  Other Topics Concern  . Not on file  Social History Narrative   Lives in [place] with 2 sons ("they live with me!").  Unsteady gait.  Uses walker/cane to get around.  Does not routinely exercise.     Family History  Problem Relation Age of Onset  . Hypertension Father      Review of Systems: All other systems reviewed and are otherwise negative except as noted above.  Physical Exam: Vitals- BP 118/70, Hr 41, Temp 98.2, R 16, 96% sats GEN- The patient is elderly appearing, alert, NAD HEENT: normocephalic, atraumatic; sclera clear, conjunctiva pink; hearing intact; oropharynx clear; neck supple Lungs- Clear to ausculation bilaterally, normal work of breathing.  No wheezes, rales, rhonchi Heart- bradycardic regular rhythm GI- soft, non-tender, non-distended, bowel sounds present Extremities- no clubbing, cyanosis, or edema; DP/PT/radial pulses 2+ bilaterally MS- age appropriate atrophy Skin- skin breakdown noted Psych- euthymic mood, full affect Neuro- strength and sensation are intact  Labs:   Lab Results  Component Value Date   WBC 10.8 (H) 11/03/2017   HGB 12.9 (L) 11/03/2017   HCT 37.5 (L) 11/03/2017   MCV 91.5 11/03/2017   PLT 226 11/03/2017    Recent Labs  Lab 11/03/17 0711  NA 131*  K 4.3  CL 103  CO2 24  BUN 21*  CREATININE 1.44*  CALCIUM 8.0*  GLUCOSE 126*        Radiology/Studies: Dg Chest Port 1 View  Result Date: 11/03/2017 CLINICAL DATA:  Fall.  Preop. EXAM: PORTABLE CHEST 1 VIEW COMPARISON:  Radiograph 09/15/2014 FINDINGS: Stable cardiomegaly and mediastinal contours. Aortic atherosclerosis. Probable retrocardiac hiatal hernia. No consolidation, pleural effusion or pneumothorax. No acute osseous abnormalities are seen. IMPRESSION: 1. No acute abnormality.  2. Stable cardiomegaly with aortic atherosclerosis. 3. Probable retrocardiac hiatal hernia. Electronically Signed   By: Rubye Oaks M.D.   On: 11/03/2017 02:40   Dg Hip Unilat W Or Wo Pelvis 2-3 Views Right  Result Date: 11/03/2017 CLINICAL DATA:  Fall while walking dog.  Right hip pain. EXAM: DG HIP (WITH OR WITHOUT PELVIS) 2-3V RIGHT COMPARISON:  None. FINDINGS: Comminuted displaced intertrochanteric right hip fracture. There is mild apex lateral angulation of dominant fracture fragments. Displacement involves the lesser trochanter. Femoral head remains located. Pubic rami are intact. Pubic symphysis and sacroiliac joints are congruent. IMPRESSION: Comminuted displaced intertrochanteric right proximal femur fracture. Electronically Signed   By: Rubye Oaks M.D.   On: 11/03/2017 02:41    ZOX:[id number] heart block, RRR escape (personally reviewed), prior baseline ekg reveals sinus with first degree AV block, RBBB  TELEMETRY: sinus with CHB (personally reviewed)  Echo: EF 60%, mild LA enlargement  Assessment/Plan: 1.  Complete heart block The patient has symptomatic complete heart block with bradycardia and weakness causing falls/ dizziness.  No reversible causes have been found.  I would therefore recommend pacemaker implantation at this time.  Risks, benefits, alternatives to pacemaker implantation were discussed in detail with the patient and his sons today. The patient and his sons understand that the risks include but are not limited to bleeding, infection, pneumothorax, perforation,  tamponade, vascular damage, renal failure, MI, stroke, death,  and lead dislodgement and wish to proceed. We will therefore proceed urgently with PPM at this time.  Hillis Range MD, [facility] 11/03/2017 3:27 PM

## 2017-11-03 NOTE — Consult Note (Signed)
ORTHOPAEDIC CONSULTATION  PATIENT NAME: Justin Hendrix DOB: 10/18/1922  MRN: [id number]  REQUESTING PHYSICIAN: Cammy CopaMaier, Angela, MD  Chief Complaint: Right hip pain  HPI: Justin Hendrix is a 84 y.o. male who complains of  right hip pain. He apparently became tangled while walking his dog and fell on his right hip and side. He was unable to stand or bear weight on the right lower extremity due to the right hip pain. He denied any other injuries. He denied any loss of consciousness.  Past Medical History:  Diagnosis Date  . Benign prostatic hypertrophy   . Chronic prostatitis   . Elevated PSA   . Incomplete bladder emptying   . TIA (transient ischemic attack)   . Urinary frequency    History reviewed. No pertinent surgical history. Social History   Socioeconomic History  . Marital status: Married    Spouse name: Not on file  . Number of children: Not on file  . Years of education: Not on file  . Highest education level: Not on file  Occupational History  . Not on file  Social Needs  . Financial resource strain: Not on file  . Food insecurity:    Worry: Not on file    Inability: Not on file  . Transportation needs:    Medical: Not on file    Non-medical: Not on file  Tobacco Use  . Smoking status: Never Smoker  . Smokeless tobacco: Current User    Types: Chew  Substance and Sexual Activity  . Alcohol use: No  . Drug use: No  . Sexual activity: Not on file  Lifestyle  . Physical activity:    Days per week: Not on file    Minutes per session: Not on file  . Stress: Not on file  Relationships  . Social connections:    Talks on phone: Not on file    Gets together: Not on file    Attends religious service: Not on file    Active member of club or organization: Not on file    Attends meetings of clubs or organizations: Not on file    Relationship status: Not on file  Other Topics Concern  . Not on file  Social History Narrative  . Not on file   Family History   Problem Relation Age of Onset  . Hypertension Father    No Known Allergies Prior to Admission medications   Medication Sig Start Date End Date Taking? Authorizing Provider  amantadine (SYMMETREL) 100 MG capsule Take 100 mg by mouth 2 (two) times daily.   Yes [provider]  finasteride (PROSCAR) 5 MG tablet Take 5 mg by mouth. 04/04/16  Yes [provider]  pantoprazole (PROTONIX) 40 MG tablet Take 40 mg by mouth daily.   Yes [provider]  sertraline (ZOLOFT) 100 MG tablet  05/09/16  Yes [provider]  tamsulosin (FLOMAX) 0.4 MG CAPS capsule Take 0.4 mg by mouth. 06/17/15  Yes [provider]  aspirin 81 MG tablet Take 81 mg by mouth daily.    [provider]  budesonide-formoterol (SYMBICORT) 160-4.5 MCG/ACT inhaler Inhale 2 puffs into the lungs 2 (two) times daily.    [provider]  fluticasone (FLONASE) 50 MCG/ACT nasal spray 2 sprays by Each Nare route daily.    [provider]   Dg Chest Port 1 View  Result Date: 11/03/2017 CLINICAL DATA:  Fall.  Preop. EXAM: PORTABLE CHEST 1 VIEW COMPARISON:  Radiograph 09/15/2014 FINDINGS: Stable cardiomegaly  and mediastinal contours. Aortic atherosclerosis. Probable retrocardiac hiatal hernia. No consolidation, pleural effusion or pneumothorax. No acute osseous abnormalities are seen. IMPRESSION: 1. No acute abnormality. 2. Stable cardiomegaly with aortic atherosclerosis. 3. Probable retrocardiac hiatal hernia. Electronically Signed   By: Rubye Oaks M.D.   On: 11/03/2017 02:40   Dg Hip Unilat W Or Wo Pelvis 2-3 Views Right  Result Date: 11/03/2017 CLINICAL DATA:  Fall while walking dog.  Right hip pain. EXAM: DG HIP (WITH OR WITHOUT PELVIS) 2-3V RIGHT COMPARISON:  None. FINDINGS: Comminuted displaced intertrochanteric right hip fracture. There is mild apex lateral angulation of dominant fracture fragments. Displacement involves the lesser trochanter. Femoral head  remains located. Pubic rami are intact. Pubic symphysis and sacroiliac joints are congruent. IMPRESSION: Comminuted displaced intertrochanteric right proximal femur fracture. Electronically Signed   By: Rubye Oaks M.D.   On: 11/03/2017 02:41    Positive ROS: All other systems have been reviewed and were otherwise negative with the exception of those mentioned in the HPI and as above.  Physical Exam: General: Well developed, well nourished male seen in no acute distress. HEENT: Atraumatic and normocephalic. Sclera are clear. Extraocular motion is intact. Oropharynx is clear with moist mucosa. Neck: Supple, nontender, good range of motion. No JVD or carotid bruits. Lungs: Clear to auscultation bilaterally. Cardiovascular: Regular rate and rhythm with normal S1 and S2. 2-3/6 murmur. No gallops or rubs. Pedal pulses are palpable bilaterally. Homans test is negative bilaterally. No significant pretibial or ankle edema. Abdomen: Soft, nontender, and nondistended. Bowel sounds are present. Skin: Abrasions to right elbow. Neurologic: Awake, alert, and oriented. Sensory function is grossly intact. Motor strength is felt to be 5 over 5 bilaterally. No clonus or tremor. Good motor coordination. Lymphatic: No axillary or cervical lymphadenopathy  MUSCULOSKELETAL: The right lower extremity is shortened and rotated. Pain is elicited with any attempted range of motion of the right hip. No tenderness to the right knee or ankle. No knee effusion  Assessment: Right subtrochanteric femur fracture  Plan: The findings were discussed in detail with the patient.  Recommendation was made for open reduction and internal fixation of the right subtrochanteric femur fracture.The usual perioperative course was discussed. The risks and benefits of surgical intervention were reviewed. The patient expressed understanding of the risks and benefits and agreed with plans for surgical intervention.   The surgical site was  signed as per the "right site surgery" protocol.   The patient is awaiting preoperative clearance from Cardiology due to his bradycardia.  Kelson Queenan P. Angie Fava M.D.

## 2017-11-03 NOTE — H&P (Signed)
[facility] [place] [facility]   PATIENT NAME: Justin Hendrix    MR#:  [id number]  DATE OF BIRTH:  02-17-1923  DATE OF ADMISSION:  11/03/2017  PRIMARY CARE PHYSICIAN: Jaclyn Shaggy, MD   REQUESTING/REFERRING PHYSICIAN:   CHIEF COMPLAINT:   Chief Complaint  Patient presents with  . Fall    HISTORY OF PRESENT ILLNESS: Justin Hendrix  is a 84 y.o. male with a known history of BPH, TIA, chronic prostatitis. Patient was brought to emergency room for severe right hip pain, status post mechanical fall.  Patient got tangled on his dog's leash, while they were out for a walk.  There was no chest pain, dizziness, loss of consciousness. Blood test done in the emergency room are notable for creatinine level of 1.34.  Troponin level is within normal limits.  Chest x-ray, reviewed by myself, shows no acute cardiopulmonary disease.  EKG shows junctional rhythm with low rate, and 30-40s.  Right hip x-ray confirms comminuted displaced intertrochanteric right proximal femur fracture.  Patient is admitted for pain control and for hip fracture surgical repair.  PAST MEDICAL HISTORY:   Past Medical History:  Diagnosis Date  . Benign prostatic hypertrophy   . Chronic prostatitis   . Elevated PSA   . Incomplete bladder emptying   . TIA (transient ischemic attack)   . Urinary frequency     PAST SURGICAL HISTORY: History reviewed. No pertinent surgical history.  SOCIAL HISTORY:  Social History   Tobacco Use  . Smoking status: Never Smoker  . Smokeless tobacco: Current User    Types: Chew  Substance Use Topics  . Alcohol use: No    FAMILY HISTORY:  Family History  Problem Relation Age of Onset  . Hypertension Father     DRUG ALLERGIES: No Known Allergies  REVIEW OF SYSTEMS:   CONSTITUTIONAL: No fever, fatigue or weakness.  EYES: No blurred or double vision.  EARS, NOSE, AND THROAT: No tinnitus or ear pain.  RESPIRATORY: No cough, shortness of breath,  wheezing or hemoptysis.  CARDIOVASCULAR: No chest pain, orthopnea, edema.  GASTROINTESTINAL: No nausea, vomiting, diarrhea or abdominal pain.  GENITOURINARY: No dysuria, hematuria.  ENDOCRINE: No polyuria, nocturia,  HEMATOLOGY: No anemia, easy bruising or bleeding SKIN: No rash or lesion. MUSCULOSKELETAL: Positive for right hip pain, status post fall. NEUROLOGIC: No focal weakness.  PSYCHIATRY: No anxiety or depression.   MEDICATIONS AT HOME:  Prior to Admission medications   Medication Sig Start Date End Date Taking? Authorizing Provider  amantadine (SYMMETREL) 100 MG capsule Take 100 mg by mouth 2 (two) times daily.   Yes [provider]  finasteride (PROSCAR) 5 MG tablet Take 5 mg by mouth. 04/04/16  Yes [provider]  pantoprazole (PROTONIX) 40 MG tablet Take 40 mg by mouth daily.   Yes [provider]  sertraline (ZOLOFT) 100 MG tablet  05/09/16  Yes [provider]  tamsulosin (FLOMAX) 0.4 MG CAPS capsule Take 0.4 mg by mouth. 06/17/15  Yes [provider]  aspirin 81 MG tablet Take 81 mg by mouth daily.    [provider]  budesonide-formoterol (SYMBICORT) 160-4.5 MCG/ACT inhaler Inhale 2 puffs into the lungs 2 (two) times daily.    [provider]  fluticasone (FLONASE) 50 MCG/ACT nasal spray 2 sprays by Each Nare route daily.    [provider]      PHYSICAL EXAMINATION:   VITAL SIGNS: Blood pressure (!) 157/72, pulse (!) 41, temperature 97.7 F (36.5 C),  temperature source Oral, resp. rate 14, height 5\' 10"  (1.778 m), weight 90.7 kg (200 lb), SpO2 96 %.  GENERAL:  84 y.o.-year-old patient lying in the bed, in moderate distress, secondary to right hip pain.  EYES: Pupils equal, round, reactive to light and accommodation. No scleral icterus.  HEENT: Head atraumatic, normocephalic. Oropharynx and nasopharynx clear.  NECK:  Supple, no jugular venous distention. No thyroid enlargement, no tenderness.   LUNGS: Normal breath sounds bilaterally, no wheezing, rales,rhonchi or crepitation. No use of accessory muscles of respiration.  CARDIOVASCULAR: S1, S2 normal. No S3/S4.  ABDOMEN: Soft, nontender, nondistended. Bowel sounds present. No organomegaly or mass.  EXTREMITIES: Right lower extremity is noted externally rotated and shortened.  There is severe pain and reduced range of motion at right hip joint. NEUROLOGIC: No focal weakness.  Gait not checked, as patient cannot ambulate due to right hip pain.  PSYCHIATRIC: The patient is alert and oriented x 3.  SKIN: No obvious rash, lesion, or ulcer.   LABORATORY PANEL:   CBC Recent Labs  Lab 11/03/17 0124  WBC 8.1  HGB 14.5  HCT 42.0  PLT 224  MCV 90.2  MCH 31.1  MCHC 34.5  RDW 15.0*   ------------------------------------------------------------------------------------------------------------------  Chemistries  Recent Labs  Lab 11/03/17 0124  NA 132*  K 3.9  CL 101  CO2 25  GLUCOSE 109*  BUN 20  CREATININE 1.34*  CALCIUM 8.5*   ------------------------------------------------------------------------------------------------------------------ estimated creatinine clearance is 39 mL/min (A) (by C-G formula based on SCr of 1.34 mg/dL (H)). ------------------------------------------------------------------------------------------------------------------ No results for input(s): TSH, T4TOTAL, T3FREE, THYROIDAB in the last 72 hours.  Invalid input(s): FREET3   Coagulation profile No results for input(s): INR, PROTIME in the last 168 hours. ------------------------------------------------------------------------------------------------------------------- No results for input(s): DDIMER in the last 72 hours. -------------------------------------------------------------------------------------------------------------------  Cardiac Enzymes Recent Labs  Lab 11/03/17 0124  TROPONINI <0.03    ------------------------------------------------------------------------------------------------------------------ Invalid input(s): POCBNP  ---------------------------------------------------------------------------------------------------------------  Urinalysis No results found for: COLORURINE, APPEARANCEUR, LABSPEC, PHURINE, GLUCOSEU, HGBUR, BILIRUBINUR, KETONESUR, PROTEINUR, UROBILINOGEN, NITRITE, LEUKOCYTESUR   RADIOLOGY: Dg Chest Port 1 View  Result Date: 11/03/2017 CLINICAL DATA:  Fall.  Preop. EXAM: PORTABLE CHEST 1 VIEW COMPARISON:  Radiograph 09/15/2014 FINDINGS: Stable cardiomegaly and mediastinal contours. Aortic atherosclerosis. Probable retrocardiac hiatal hernia. No consolidation, pleural effusion or pneumothorax. No acute osseous abnormalities are seen. IMPRESSION: 1. No acute abnormality. 2. Stable cardiomegaly with aortic atherosclerosis. 3. Probable retrocardiac hiatal hernia. Electronically Signed   By: Rubye Oaks M.D.   On: 11/03/2017 02:40   Dg Hip Unilat W Or Wo Pelvis 2-3 Views Right  Result Date: 11/03/2017 CLINICAL DATA:  Fall while walking dog.  Right hip pain. EXAM: DG HIP (WITH OR WITHOUT PELVIS) 2-3V RIGHT COMPARISON:  None. FINDINGS: Comminuted displaced intertrochanteric right hip fracture. There is mild apex lateral angulation of dominant fracture fragments. Displacement involves the lesser trochanter. Femoral head remains located. Pubic rami are intact. Pubic symphysis and sacroiliac joints are congruent. IMPRESSION: Comminuted displaced intertrochanteric right proximal femur fracture. Electronically Signed   By: Rubye Oaks M.D.   On: 11/03/2017 02:41    EKG: Orders placed or performed during the hospital encounter of 11/03/17  . ED EKG  . ED EKG  . EKG 12-Lead  . EKG 12-Lead    IMPRESSION AND PLAN:  1. Comminuted displaced intertrochanteric right proximal femur fracture.  Continue pain control.  Ortho team is consulted for further  evaluation and treatment. 2.  Bradycardia, with heart rate in 30s.  Patient seems to be asymptomatic.  However, will need Cardiology to clear the patient for surgical procedure.  Continue to monitor on telemetry. 3. HTN, will start Norvasc 2.5 mg. Cont to monitor BP closely. 4. CRF, creatinine is 1.34 at baseline.  Continue to monitor kidney function closely and avoid nephrotoxic medications.  All the records are reviewed and case discussed with ED provider. Management plans discussed with the patient, family and they are in agreement.  CODE STATUS: Advance Directive Documentation     Most Recent Value  Type of Advance Directive  Healthcare Power of Attorney, Living will  Pre-existing out of facility DNR order (yellow form or pink MOST form)  -  "MOST" Form in Place?  -       TOTAL TIME TAKING CARE OF THIS PATIENT: 40 minutes.    Cammy CopaAngela Damiean Lukes M.D on 11/03/2017 at 4:24 AM  Between 7am to 6pm - Pager - [phone]  After 6pm go to [email or web address] - password [email or web address] [facility]  [facility]  Office  [phone]  CC: Primary care physician; Jaclyn Shaggyate, Denny C, MD

## 2017-11-03 NOTE — Progress Notes (Signed)
Orthopedic Tech Progress Note Patient Details:  Candis MusaCameron W Amore 01/03/1923 [id number]  Patient ID: Candis Musaameron W Cozby, male   DOB: 10/11/1922, 84 y.o.   MRN: [id number] Pt cant have ohf due to age restrictions  Trinna PostMartinez, Kearstyn Avitia J 11/03/2017, 10:56 PM

## 2017-11-03 NOTE — Consult Note (Signed)
ELECTROPHYSIOLOGY CONSULT NOTE    Patient ID: Justin MusaCameron W Donn MRN: [id number], DOB/AGE: 82/01/1923 82 y.o.  Admit date: 11/03/2017 Date of Consult: 11/03/2017  Primary Physician: Jaclyn Shaggyate, Denny C, MD Primary Cardiologist: Dr Mariah MillingGollan  Patient Profile: Justin Hendrix is a 84 y.o. male with a history of TIA, parkisons disease, and unsteadiness who is being seen today for the evaluation of complete heart block at the request of Dr Mariah MillingGollan.  HPI:  Justin MusaCameron W Cramer is a 84 y.o. male admitted [facility] after fall 11/03/17.  He reports being in good health and living at home with his sons.  He is not very active but does ambulate with a walker or cane.  He has had intermittent episodes of dizziness over the past 2 months.  Episodes leave him feeling "weak". Early this am, he had a similar spell of abrupt and transient weakness during which he fell and broke his hip.  He does not recall losing consciousness.  Upon EMS arrival, he was found to have complete heart block.  He had been transferred by the hospitalists to [facility] for EP consultation at the request of Dr Mariah Millinggollan.  He denies chest pain, palpitations, dyspnea, PND, orthopnea, nausea, vomiting,syncope, edema, weight gain, or early satiety.  Past Medical History:  Diagnosis Date  . Benign prostatic hypertrophy   . Chest pain    a. 04/2014 Echo: EF 50-55%, mild LVH, nl RV fxn, mild to mod AS, mild AI, mild TR; b. 04/2014 MV: nl EF. No ischemia.  . Chronic prostatitis   . Elevated PSA   . Incomplete bladder emptying   . Parkinson's disease (HCC)   . Pre-syncope   . TIA (transient ischemic attack)   . Unsteady gait   . Urinary frequency      Surgical History: No past surgical history on file.   Medications Prior to Admission  Medication Sig Dispense Refill Last Dose  . acetaminophen (TYLENOL) 325 MG tablet Take 2 tablets (650 mg total) by mouth every 6 (six) hours as needed for mild pain (or Fever >/= 101). 20 tablet 0   .  amantadine (SYMMETREL) 100 MG capsule Take 100 mg by mouth 2 (two) times daily.   Past Week at unknown  . aspirin 81 MG tablet Take 81 mg by mouth daily.   Past Week at Unknown time  . budesonide-formoterol (SYMBICORT) 160-4.5 MCG/ACT inhaler Inhale 2 puffs into the lungs 2 (two) times daily.   prn at prn  . ceFAZolin (ANCEF) 2-4 GM/100ML-% IVPB Inject 100 mLs (2 g total) into the vein to Surgery for 1 dose. 1 each 0   . docusate sodium (COLACE) 100 MG capsule Take 1 capsule (100 mg total) by mouth 2 (two) times daily. 10 capsule 0   . finasteride (PROSCAR) 5 MG tablet Take 5 mg by mouth daily.    unknown at unknown  . fluticasone (FLONASE) 50 MCG/ACT nasal spray 2 sprays by Each Nare route daily. as needed   prn at prn  . ondansetron (ZOFRAN) 4 MG tablet Take 1 tablet (4 mg total) by mouth every 6 (six) hours as needed for nausea. 20 tablet 0   . pantoprazole (PROTONIX) 40 MG tablet Take 40 mg by mouth daily.   Past Week at unknown  . sertraline (ZOLOFT) 100 MG tablet Take 100 mg by mouth daily.    Past Week at unknown  . tamsulosin (FLOMAX) 0.4 MG CAPS capsule Take 0.4 mg by mouth daily.    Past Week  at unknown    Inpatient Medications:   Allergies: No Known Allergies  Social History   Socioeconomic History  . Marital status: Widowed    Spouse name: Not on file  . Number of children: Not on file  . Years of education: Not on file  . Highest education level: Not on file  Occupational History  . Not on file  Social Needs  . Financial resource strain: Not on file  . Food insecurity:    Worry: Not on file    Inability: Not on file  . Transportation needs:    Medical: Not on file    Non-medical: Not on file  Tobacco Use  . Smoking status: Former Games developer  . Smokeless tobacco: Current User    Types: Chew  . Tobacco comment: smoked for a few yrs while in the [employer]  Substance and Sexual Activity  . Alcohol use: No  . Drug use: No  . Sexual activity: Not on file  Lifestyle  .  Physical activity:    Days per week: Not on file    Minutes per session: Not on file  . Stress: Not on file  Relationships  . Social connections:    Talks on phone: Not on file    Gets together: Not on file    Attends religious service: Not on file    Active member of club or organization: Not on file    Attends meetings of clubs or organizations: Not on file    Relationship status: Not on file  . Intimate partner violence:    Fear of current or ex partner: Not on file    Emotionally abused: Not on file    Physically abused: Not on file    Forced sexual activity: Not on file  Other Topics Concern  . Not on file  Social History Narrative   Lives in [place] with 2 sons ("they live with me!").  Unsteady gait.  Uses walker/cane to get around.  Does not routinely exercise.     Family History  Problem Relation Age of Onset  . Hypertension Father      Review of Systems: All other systems reviewed and are otherwise negative except as noted above.  Physical Exam: Vitals- BP 118/70, Hr 41, Temp 98.2, R 16, 96% sats GEN- The patient is elderly appearing, alert, NAD HEENT: normocephalic, atraumatic; sclera clear, conjunctiva pink; hearing intact; oropharynx clear; neck supple Lungs- Clear to ausculation bilaterally, normal work of breathing.  No wheezes, rales, rhonchi Heart- bradycardic regular rhythm GI- soft, non-tender, non-distended, bowel sounds present Extremities- no clubbing, cyanosis, or edema; DP/PT/radial pulses 2+ bilaterally MS- age appropriate atrophy Skin- skin breakdown noted Psych- euthymic mood, full affect Neuro- strength and sensation are intact  Labs:   Lab Results  Component Value Date   WBC 10.8 (H) 11/03/2017   HGB 12.9 (L) 11/03/2017   HCT 37.5 (L) 11/03/2017   MCV 91.5 11/03/2017   PLT 226 11/03/2017    Recent Labs  Lab 11/03/17 0711  NA 131*  K 4.3  CL 103  CO2 24  BUN 21*  CREATININE 1.44*  CALCIUM 8.0*  GLUCOSE 126*        Radiology/Studies: Dg Chest Port 1 View  Result Date: 11/03/2017 CLINICAL DATA:  Fall.  Preop. EXAM: PORTABLE CHEST 1 VIEW COMPARISON:  Radiograph 09/15/2014 FINDINGS: Stable cardiomegaly and mediastinal contours. Aortic atherosclerosis. Probable retrocardiac hiatal hernia. No consolidation, pleural effusion or pneumothorax. No acute osseous abnormalities are seen. IMPRESSION: 1. No acute abnormality.  2. Stable cardiomegaly with aortic atherosclerosis. 3. Probable retrocardiac hiatal hernia. Electronically Signed   By: Rubye Oaks M.D.   On: 11/03/2017 02:40   Dg Hip Unilat W Or Wo Pelvis 2-3 Views Right  Result Date: 11/03/2017 CLINICAL DATA:  Fall while walking dog.  Right hip pain. EXAM: DG HIP (WITH OR WITHOUT PELVIS) 2-3V RIGHT COMPARISON:  None. FINDINGS: Comminuted displaced intertrochanteric right hip fracture. There is mild apex lateral angulation of dominant fracture fragments. Displacement involves the lesser trochanter. Femoral head remains located. Pubic rami are intact. Pubic symphysis and sacroiliac joints are congruent. IMPRESSION: Comminuted displaced intertrochanteric right proximal femur fracture. Electronically Signed   By: Rubye Oaks M.D.   On: 11/03/2017 02:41    ZOX:[id number] heart block, RRR escape (personally reviewed), prior baseline ekg reveals sinus with first degree AV block, RBBB  TELEMETRY: sinus with CHB (personally reviewed)  Echo: EF 60%, mild LA enlargement  Assessment/Plan: 1.  Complete heart block The patient has symptomatic complete heart block with bradycardia and weakness causing falls/ dizziness.  No reversible causes have been found.  I would therefore recommend pacemaker implantation at this time.  Risks, benefits, alternatives to pacemaker implantation were discussed in detail with the patient and his sons today. The patient and his sons understand that the risks include but are not limited to bleeding, infection, pneumothorax, perforation,  tamponade, vascular damage, renal failure, MI, stroke, death,  and lead dislodgement and wish to proceed. We will therefore proceed urgently with PPM at this time.  Hillis Range MD, [facility] 11/03/2017 3:27 PM

## 2017-11-03 NOTE — Progress Notes (Signed)
*  PRELIMINARY RESULTS* Echocardiogram 2D Echocardiogram has been performed.  Justin [name] 11/03/2017, 12:21 PM

## 2017-11-03 NOTE — Discharge Summary (Signed)
[facility] - [place] at [facility]   PATIENT NAME: Justin Hendrix    MR#:  [id number]  DATE OF BIRTH:  August 30, 1922  DATE OF ADMISSION:  11/03/2017 ADMITTING PHYSICIAN: Cammy Copa, MD  DATE OF DISCHARGE: 11/03/2017  PRIMARY CARE PHYSICIAN: Jaclyn Shaggy, MD    ADMISSION DIAGNOSIS:  Junctional rhythm [I49.8] Closed comminuted intertrochanteric fracture of right femur, initial encounter (HCC) [S72.141A]  DISCHARGE DIAGNOSIS:  Active Problems:  Hip fracture   Complete heart block.  SECONDARY DIAGNOSIS:   Past Medical History:  Diagnosis Date  . Benign prostatic hypertrophy   . Chest pain    a. 04/2014 Echo: EF 50-55%, mild LVH, nl RV fxn, mild to mod AS, mild AI, mild TR; b. 04/2014 MV: nl EF. No ischemia.  . Chronic prostatitis   . Elevated PSA   . Incomplete bladder emptying   . Parkinson's disease (HCC)   . Pre-syncope   . TIA (transient ischemic attack)   . Unsteady gait   . Urinary frequency     HOSPITAL COURSE:   1. Comminuted displaced intertrochanteric right proximal femur fracture.  Continue pain control.  Ortho team is consulted for further evaluation and treatment.  Now at [facility], pt will need ortho consult. 2.  Bradycardia, with heart rate in 30s.  Patient Found to have complete heart block, Cardio planned for pace maker placement at [facility]- accepted by Dr.Allred for procedure, after that- can admit to Hospitalist- I spoke to Dr.Abrol, Cathlab to call them once procedure is done. 3. HTN, will start Norvasc 2.5 mg. Cont to monitor BP closely. 4. CRF, creatinine is 1.34 at baseline.  Continue to monitor kidney function closely and avoid nephrotoxic medications.    DISCHARGE CONDITIONS:   Stable.  CONSULTS OBTAINED:  Treatment Team:  Donato Heinz, MD Antonieta Iba, MD  DRUG ALLERGIES:  No Known Allergies  DISCHARGE MEDICATIONS:   Allergies as of 11/03/2017   No Known Allergies     Medication List    TAKE these  medications   acetaminophen 325 MG tablet Commonly known as:  TYLENOL Take 2 tablets (650 mg total) by mouth every 6 (six) hours as needed for mild pain (or Fever >/= 101).   amantadine 100 MG capsule Commonly known as:  SYMMETREL Take 100 mg by mouth 2 (two) times daily.   aspirin 81 MG tablet Take 81 mg by mouth daily.   budesonide-formoterol 160-4.5 MCG/ACT inhaler Commonly known as:  SYMBICORT Inhale 2 puffs into the lungs 2 (two) times daily.   ceFAZolin 2-4 GM/100ML-% IVPB Commonly known as:  ANCEF Inject 100 mLs (2 g total) into the vein to Surgery for 1 dose.   docusate sodium 100 MG capsule Commonly known as:  COLACE Take 1 capsule (100 mg total) by mouth 2 (two) times daily.   finasteride 5 MG tablet Commonly known as:  PROSCAR Take 5 mg by mouth daily.   fluticasone 50 MCG/ACT nasal spray Commonly known as:  FLONASE 2 sprays by Each Nare route daily. as needed   ondansetron 4 MG tablet Commonly known as:  ZOFRAN Take 1 tablet (4 mg total) by mouth every 6 (six) hours as needed for nausea.   pantoprazole 40 MG tablet Commonly known as:  PROTONIX Take 40 mg by mouth daily.   sertraline 100 MG tablet Commonly known as:  ZOLOFT Take 100 mg by mouth daily.   tamsulosin 0.4 MG Caps capsule Commonly known as:  FLOMAX Take 0.4 mg by mouth  daily.        DISCHARGE INSTRUCTIONS:    Follow with [place] cardiology as advised.  If you experience worsening of your admission symptoms, develop shortness of breath, life threatening emergency, suicidal or homicidal thoughts you must seek medical attention immediately by calling 911 or calling your MD immediately  if symptoms less severe.  You Must read complete instructions/literature along with all the possible adverse reactions/side effects for all the Medicines you take and that have been prescribed to you. Take any new Medicines after you have completely understood and accept all the possible adverse  reactions/side effects.   Please note  You were cared for by a hospitalist during your hospital stay. If you have any questions about your discharge medications or the care you received while you were in the hospital after you are discharged, you can call the unit and asked to speak with the hospitalist on call if the hospitalist that took care of you is not available. Once you are discharged, your primary care physician will handle any further medical issues. Please note that NO REFILLS for any discharge medications will be authorized once you are discharged, as it is imperative that you return to your primary care physician (or establish a relationship with a primary care physician if you do not have one) for your aftercare needs so that they can reassess your need for medications and monitor your lab values.    Today   CHIEF COMPLAINT:   Chief Complaint  Patient presents with  . Fall    HISTORY OF PRESENT ILLNESS:  Justin Hendrix  is a 84 y.o. male with a known history of BPH, TIA, chronic prostatitis. Patient was brought to emergency room for severe right hip pain, status post mechanical fall.  Patient got tangled on his dog's leash, while they were out for a walk.  There was no chest pain, dizziness, loss of consciousness. Blood test done in the emergency room are notable for creatinine level of 1.34.  Troponin level is within normal limits.  Chest x-ray, reviewed by myself, shows no acute cardiopulmonary disease.  EKG shows junctional rhythm with low rate, and 30-40s.  Right hip x-ray confirms comminuted displaced intertrochanteric right proximal femur fracture.  Patient is admitted for pain control and for hip fracture surgical repair.   VITAL SIGNS:  Blood pressure 118/70, pulse (!) 41, temperature 98.2 F (36.8 C), resp. rate 16, height 5\' 10"  (1.778 m), weight 90.7 kg (200 lb), SpO2 96 %.  I/O:  No intake or output data in the 24 hours ending 11/03/17 1327  PHYSICAL  EXAMINATION:   GENERAL:  84 y.o.-year-old patient lying in the bed, in moderate distress, secondary to right hip pain.  EYES: Pupils equal, round, reactive to light and accommodation. No scleral icterus.  HEENT: Head atraumatic, normocephalic. Oropharynx and nasopharynx clear.  NECK:  Supple, no jugular venous distention. No thyroid enlargement, no tenderness.  LUNGS: Normal breath sounds bilaterally, no wheezing, rales,rhonchi or crepitation. No use of accessory muscles of respiration.  CARDIOVASCULAR: S1, S2 normal. No S3/S4.  ABDOMEN: Soft, nontender, nondistended. Bowel sounds present. No organomegaly or mass.  EXTREMITIES: Right lower extremity is noted externally rotated and shortened.  There is severe pain and reduced range of motion at right hip joint. NEUROLOGIC: No focal weakness.  Gait not checked, as patient cannot ambulate due to right hip pain.  PSYCHIATRIC: The patient is alert and oriented x 3.  SKIN: No obvious rash, lesion, or ulcer.  DATA REVIEW:   CBC Recent Labs  Lab 11/03/17 0711  WBC 10.8*  HGB 12.9*  HCT 37.5*  PLT 226    Chemistries  Recent Labs  Lab 11/03/17 0711  NA 131*  K 4.3  CL 103  CO2 24  GLUCOSE 126*  BUN 21*  CREATININE 1.44*  CALCIUM 8.0*    Cardiac Enzymes Recent Labs  Lab 11/03/17 0124  TROPONINI <0.03    Microbiology Results  Results for orders placed or performed during the hospital encounter of 11/03/17  MRSA PCR Screening     Status: None   Collection Time: 11/03/17  5:41 AM  Result Value Ref Range Status   MRSA by PCR NEGATIVE NEGATIVE Final    Comment:        The GeneXpert MRSA Assay (FDA approved for NASAL specimens only), is one component of a comprehensive MRSA colonization surveillance program. It is not intended to diagnose MRSA infection nor to guide or monitor treatment for MRSA infections. Performed at [facility], [address] [place] [place], [place] [address]     RADIOLOGY:  Dg Chest  Port 1 View  Result Date: 11/03/2017 CLINICAL DATA:  Fall.  Preop. EXAM: PORTABLE CHEST 1 VIEW COMPARISON:  Radiograph 09/15/2014 FINDINGS: Stable cardiomegaly and mediastinal contours. Aortic atherosclerosis. Probable retrocardiac hiatal hernia. No consolidation, pleural effusion or pneumothorax. No acute osseous abnormalities are seen. IMPRESSION: 1. No acute abnormality. 2. Stable cardiomegaly with aortic atherosclerosis. 3. Probable retrocardiac hiatal hernia. Electronically Signed   By: Rubye Oaks M.D.   On: 11/03/2017 02:40   Dg Hip Unilat W Or Wo Pelvis 2-3 Views Right  Result Date: 11/03/2017 CLINICAL DATA:  Fall while walking dog.  Right hip pain. EXAM: DG HIP (WITH OR WITHOUT PELVIS) 2-3V RIGHT COMPARISON:  None. FINDINGS: Comminuted displaced intertrochanteric right hip fracture. There is mild apex lateral angulation of dominant fracture fragments. Displacement involves the lesser trochanter. Femoral head remains located. Pubic rami are intact. Pubic symphysis and sacroiliac joints are congruent. IMPRESSION: Comminuted displaced intertrochanteric right proximal femur fracture. Electronically Signed   By: Rubye Oaks M.D.   On: 11/03/2017 02:41    EKG:   Orders placed or performed during the hospital encounter of 11/03/17  . ED EKG  . ED EKG  . EKG 12-Lead  . EKG 12-Lead  . EKG 12-Lead  . EKG 12-Lead  . EKG 12-Lead  . EKG 12-Lead      Management plans discussed with the patient, family and they are in agreement.  CODE STATUS:     Code Status Orders  (From admission, onward)        Start     Ordered   11/03/17 0440  Full code  Continuous     11/03/17 0439    Code Status History    This patient has a current code status but no historical code status.    Advance Directive Documentation     Most Recent Value  Type of Advance Directive  Healthcare Power of Attorney  Pre-existing out of facility DNR order (yellow form or pink MOST form)  -  "MOST" Form in  Place?  -      TOTAL TIME TAKING CARE OF THIS PATIENT: 50 minutes.  Spoke to Cardiology team and transfer center and Dr. Susie Cassette.  Altamese Dilling M.D on 11/03/2017 at 1:27 PM  Between 7am to 6pm - Pager - [phone]  After 6pm go to [email or web address] - Social research officer, government  [employer]  [phone]  CC: Primary care physician; Jaclyn Shaggyate, Denny C, MD   Note: This dictation was prepared with Dragon dictation along with smaller phrase technology. Any transcriptional errors that result from this process are unintentional.

## 2017-11-03 NOTE — Consult Note (Signed)
   ORTHOPAEDIC CONSULTATION  REQUESTING PHYSICIAN: Abrol, Nayana, MD  Chief Complaint: R hip fracture  HPI: Justin Hendrix is a 84 y.o. male with  Transfer from [place] for cardiac pacemaker placement.  Patient sustained ground level fall and has right displaced intertrochanteric femur fracture.  Primary history gathered from family as patient has moderate dementia.  Patient reportedly per medical team clear for surgery tomorrow and orthopedics consulted.    Past Medical History:  Diagnosis Date  . Benign prostatic hypertrophy   . Chest pain    a. 04/2014 Echo: EF 50-55%, mild LVH, nl RV fxn, mild to mod AS, mild AI, mild TR; b. 04/2014 MV: nl EF. No ischemia.  . Chronic prostatitis   . Elevated PSA   . Incomplete bladder emptying   . Parkinson's disease (HCC)   . Pre-syncope   . TIA (transient ischemic attack)   . Unsteady gait   . Urinary frequency    No past surgical history on file. Social History   Socioeconomic History  . Marital status: Widowed    Spouse name: Not on file  . Number of children: Not on file  . Years of education: Not on file  . Highest education level: Not on file  Occupational History  . Not on file  Social Needs  . Financial resource strain: Not on file  . Food insecurity:    Worry: Not on file    Inability: Not on file  . Transportation needs:    Medical: Not on file    Non-medical: Not on file  Tobacco Use  . Smoking status: Former Smoker  . Smokeless tobacco: Current User    Types: Chew  . Tobacco comment: smoked for a few yrs while in the [employer]  Substance and Sexual Activity  . Alcohol use: No  . Drug use: No  . Sexual activity: Not on file  Lifestyle  . Physical activity:    Days per week: Not on file    Minutes per session: Not on file  . Stress: Not on file  Relationships  . Social connections:    Talks on phone: Not on file    Gets together: Not on file    Attends religious service: Not on file    Active member of  club or organization: Not on file    Attends meetings of clubs or organizations: Not on file    Relationship status: Not on file  Other Topics Concern  . Not on file  Social History Narrative   Lives in [place] with 2 sons ("they live with me!").  Unsteady gait.  Uses walker/cane to get around.  Does not routinely exercise.   Family History  Problem Relation Age of Onset  . Hypertension Father    No Known Allergies Prior to Admission medications   Medication Sig Start Date End Date Taking? Authorizing Provider  acetaminophen (TYLENOL) 325 MG tablet Take 2 tablets (650 mg total) by mouth every 6 (six) hours as needed for mild pain (or Fever >/= 101). 11/03/17   Vachhani, Vaibhavkumar, MD  amantadine (SYMMETREL) 100 MG capsule Take 100 mg by mouth 2 (two) times daily.    [provider]  aspirin 81 MG tablet Take 81 mg by mouth daily.    [provider]  budesonide-formoterol (SYMBICORT) 160-4.5 MCG/ACT inhaler Inhale 2 puffs into the lungs 2 (two) times daily.    [provider]  ceFAZolin (ANCEF) 2-4 GM/100ML-% IVPB Inject 100 mLs (2 g total) into the   vein to Surgery for 1 dose. 11/03/17 11/03/17  Vachhani, Vaibhavkumar, MD  docusate sodium (COLACE) 100 MG capsule Take 1 capsule (100 mg total) by mouth 2 (two) times daily. 11/03/17   Vachhani, Vaibhavkumar, MD  finasteride (PROSCAR) 5 MG tablet Take 5 mg by mouth daily.  04/04/16   [provider]  fluticasone (FLONASE) 50 MCG/ACT nasal spray 2 sprays by Each Nare route daily. as needed    [provider]  ondansetron (ZOFRAN) 4 MG tablet Take 1 tablet (4 mg total) by mouth every 6 (six) hours as needed for nausea. 11/03/17   Vachhani, Vaibhavkumar, MD  pantoprazole (PROTONIX) 40 MG tablet Take 40 mg by mouth daily.    [provider]  sertraline (ZOLOFT) 100 MG tablet Take 100 mg by mouth daily.  05/09/16   [provider]  tamsulosin (FLOMAX) 0.4 MG CAPS capsule Take 0.4 mg by mouth  daily.  06/17/15   [provider]   Dg Chest Port 1 View  Result Date: 11/03/2017 CLINICAL DATA:  Fall.  Preop. EXAM: PORTABLE CHEST 1 VIEW COMPARISON:  Radiograph 09/15/2014 FINDINGS: Stable cardiomegaly and mediastinal contours. Aortic atherosclerosis. Probable retrocardiac hiatal hernia. No consolidation, pleural effusion or pneumothorax. No acute osseous abnormalities are seen. IMPRESSION: 1. No acute abnormality. 2. Stable cardiomegaly with aortic atherosclerosis. 3. Probable retrocardiac hiatal hernia. Electronically Signed   By: Melanie  Ehinger M.D.   On: 11/03/2017 02:40   Dg Hip Unilat W Or Wo Pelvis 2-3 Views Right  Result Date: 11/03/2017 CLINICAL DATA:  Fall while walking dog.  Right hip pain. EXAM: DG HIP (WITH OR WITHOUT PELVIS) 2-3V RIGHT COMPARISON:  None. FINDINGS: Comminuted displaced intertrochanteric right hip fracture. There is mild apex lateral angulation of dominant fracture fragments. Displacement involves the lesser trochanter. Femoral head remains located. Pubic rami are intact. Pubic symphysis and sacroiliac joints are congruent. IMPRESSION: Comminuted displaced intertrochanteric right proximal femur fracture. Electronically Signed   By: Melanie  Ehinger M.D.   On: 11/03/2017 02:41   Family History Reviewed and non-contributory, no pertinent history of problems with bleeding or anesthesia     Review of Systems 14 system ROS conducted and negative except for that noted in HPI   OBJECTIVE  Vitals: Patient Vitals for the past 8 hrs:  BP Temp Temp src Pulse Resp SpO2 Height Weight  11/03/17 1658 (!) 170/110 97.9 F (36.6 C) Oral 94 18 (!) 86 % 6' (1.829 m) 206 lb 2.1 oz (93.5 kg)  11/03/17 1637 - - - - - 94 % - -  11/03/17 1635 (!) 162/102 - - 99 11 94 % - -  11/03/17 1630 (!) 152/101 - - 96 12 90 % - -  11/03/17 1625 133/89 - - 100 (!) 22 95 % - -  11/03/17 1620 (!) 154/78 - - (!) 47 16 95 % - -  11/03/17 1616 (!) 151/77 - - (!) 40 14 96 % - -    11/03/17 1611 (!) 144/72 - - (!) 42 (!) 23 93 % - -  11/03/17 1606 (!) 152/75 - - (!) 43 (!) 25 92 % - -  11/03/17 1601 138/77 - - (!) 51 18 95 % - -  11/03/17 1556 131/74 - - (!) 48 20 96 % - -  11/03/17 1551 (!) 142/74 - - (!) 57 15 96 % - -  11/03/17 1546 (!) 156/81 - - 77 (!) 64 97 % - -  11/03/17 1546 - - - - - 96 % - -    11/03/17 1541 (!) 159/79 - - (!) 47 17 (!) 0 % - -  11/03/17 1541 - - - (!) 47 15 (!) 0 % - -  11/03/17 1536 - - - (!) 0 (!) 22 (!) 89 % - -  11/03/17 1531 - - - (!) 0 (!) 0 (!) 0 % - -  11/03/17 1530 (!) 149/71 - - (!) 50 15 96 % - -  11/03/17 1527 - - - (!) 0 (!) 0 (!) 0 % - -   General: Alert, no acute distress Cardiovascular: No pedal edema Respiratory: No cyanosis, no use of accessory musculature GI: No organomegaly, abdomen is soft and non-tender Skin: No lesions in the area of chief complaint other than those listed below in MSK exam.  Neurologic: Sensation intact distally save for the below mentioned MSK exam Psychiatric: Patient is competent for consent with normal mood and affect Lymphatic: No axillary or cervical lymphadenopathy Extremities  RLE: Shortened and externally rotated.  ROM deferred. + GS/TA/EHL. Sensation intact in DP/SP/S/S/P distributions. 2+ DP pulse with warm and well perfused digits. Compartments soft and compressible, with no pain on passive stretch.   Test Results Imaging R displaced 3 part intertrochanteric femur fracture noted.  Labs cbc Recent Labs    11/03/17 0124 11/03/17 0711  WBC 8.1 10.8*  HGB 14.5 12.9*  HCT 42.0 37.5*  PLT 224 226    Labs inflam No results for input(s): CRP in the last 72 hours.  Invalid input(s): ESR  Labs coag No results for input(s): INR, PTT in the last 72 hours.  Invalid input(s): PT  Recent Labs    11/03/17 0124 11/03/17 0711  NA 132* 131*  K 3.9 4.3  CL 101 103  CO2 25 24  GLUCOSE 109* 126*  BUN 20 21*  CREATININE 1.34* 1.44*  CALCIUM 8.5* 8.0*     ASSESSMENT AND  PLAN: 84 y.o. male with the following: R displaced intertrochanteric femur fracture  Discussed options and non-operative versus operative measures.  Non-operative measures have a predictably poor set of outcomes in an ambulatory patient including bedsores and other medical complications.  Understanding this the patient/family elected to proceed with operative measures.  The risks and benefits of  surgical intervention including infection, bleeding, nerve injury, periprosthetic fracture, the need for revision surgery, leg length discrepancy, gait change, blood clots, cardiopulmonary complications, morbidity, mortality, among others, and they were willing to proceed.    Plan for OR 4/12 depending on OR availability. Discussed case and specifics with patient and his son's Jerry and Bob.    

## 2017-11-03 NOTE — Progress Notes (Signed)
[facility] given to [facility]. [facility]. IV to remain intact. Bucks traction removed. Family notified.

## 2017-11-03 NOTE — ED Notes (Addendum)
Called to give report to floor nurse. Secretary stated that RN will call me back after speaking to supervisor about pt's HR.

## 2017-11-03 NOTE — Interval H&P Note (Signed)
History and Physical Interval Note:  11/03/2017 4:40 PM  Justin Hendrix  has presented today for surgery, with the diagnosis of complete heart block  The various methods of treatment have been discussed with the patient and family. After consideration of risks, benefits and other options for treatment, the patient has consented to  Procedure(s): PACEMAKER IMPLANT (N/A) as a surgical intervention .  The patient's history has been reviewed, patient examined, no change in status, stable for surgery.  I have reviewed the patient's chart and labs.  Questions were answered to the patient's satisfaction.     Hillis RangeJames Karen Kinnard

## 2017-11-03 NOTE — ED Provider Notes (Signed)
[facility] Emergency Department Provider Note    First MD Initiated Contact with Patient 11/03/17 0302     (approximate)  I have reviewed the triage vital signs and the nursing notes.   HISTORY  Chief Complaint Fall    HPI Justin Hendrix is a 84 y.o. male with below list of chronic medical conditions presents to the emergency department via EMS status post accidental fall while taking his dog out for a walk.  Patient states 10 out of 10 right hip pain with any movement.  Patient denies any head injury no loss of consciousness.   Past Medical History:  Diagnosis Date  . Benign prostatic hypertrophy   . Chronic prostatitis   . Elevated PSA   . Incomplete bladder emptying   . TIA (transient ischemic attack)   . Urinary frequency     Patient Active Problem List   Diagnosis Date Noted  . Chest pain at rest 05/14/2014  . SOB (shortness of breath) 05/14/2014  . Chronic fatigue 05/14/2014  . Leg weakness, bilateral 05/14/2014  . Urinary urgency 05/14/2014  . Benign localized prostatic hyperplasia with lower urinary tract symptoms (LUTS) 05/14/2014  . Parkinson's disease (HCC) 05/14/2014  . Chronic prostatitis 02/14/2013  . Elevated prostate specific antigen (PSA) 02/14/2013  . Incomplete emptying of bladder 02/14/2013  . Increased frequency of urination 02/14/2013    Past surgical history No pertinent past surgical history  Prior to Admission medications   Medication Sig Start Date End Date Taking? Authorizing Provider  amantadine (SYMMETREL) 100 MG capsule Take 100 mg by mouth 2 (two) times daily.    [provider]  aspirin 81 MG tablet Take 81 mg by mouth daily.    [provider]  budesonide-formoterol (SYMBICORT) 160-4.5 MCG/ACT inhaler Inhale 2 puffs into the lungs 2 (two) times daily.    [provider]  finasteride (PROSCAR) 5 MG tablet Take 5 mg by mouth. 04/04/16   [provider]  finasteride  (PROSCAR) 5 MG tablet Take 5 mg by mouth. 10/20/16   [provider]  fluticasone (FLONASE) 50 MCG/ACT nasal spray 2 sprays by Each Nare route daily.    [provider]  pantoprazole (PROTONIX) 40 MG tablet Take 40 mg by mouth daily.    [provider]  sertraline (ZOLOFT) 100 MG tablet  05/09/16   [provider]  sertraline (ZOLOFT) 25 MG tablet Take 25 mg by mouth daily.    [provider]  tamsulosin (FLOMAX) 0.4 MG CAPS capsule Take 0.4 mg by mouth. 06/17/15   [provider]  tamsulosin (FLOMAX) 0.4 MG CAPS capsule Take 0.4 mg by mouth. 10/20/16   [provider]    Allergies No known drug allergies  Family History  Problem Relation Age of Onset  . Hypertension Father     Social History Social History   Tobacco Use  . Smoking status: Never Smoker  . Smokeless tobacco: Current User    Types: Chew  Substance Use Topics  . Alcohol use: No  . Drug use: No    Review of Systems Constitutional: No fever/chills Eyes: No visual changes. ENT: No sore throat. Cardiovascular: Denies chest pain. Respiratory: Denies shortness of breath. Gastrointestinal: No abdominal pain.  No nausea, no vomiting.  No diarrhea.  No constipation. Genitourinary: Negative for dysuria. Musculoskeletal: Negative for neck pain.  Negative for back pain.  Positive for right hip pain Integumentary: Negative for rash. Neurological: Negative for headaches, focal weakness or numbness.  ____________________________________________   PHYSICAL EXAM:  VITAL SIGNS: ED Triage Vitals  Enc Vitals Group     BP 11/03/17 0121 (!) 156/102     Pulse Rate 11/03/17 0121 (!) 40     Resp 11/03/17 0121 10     Temp 11/03/17 0123 97.7 F (36.5 C)     Temp Source 11/03/17 0123 Oral     SpO2 11/03/17 0121 100 %     Weight 11/03/17 0123 90.7 kg (200 lb)     Height 11/03/17 0123 1.778 m (5\' 10" )     Head Circumference --      Peak Flow --      Pain Score  11/03/17 0123 10     Pain Loc --      Pain Edu? --      Excl. in GC? --     Constitutional: Alert and oriented. Well appearing and in no acute distress. Eyes: Conjunctivae are normal.  Head: Atraumatic. Mouth/Throat: Mucous membranes are moist.  Oropharynx non-erythematous. Neck: No stridor.  Cardiovascular: Normal rate, regular rhythm. Good peripheral circulation. Grossly normal heart sounds. Respiratory: Normal respiratory effort.  No retractions. Lungs CTAB. Gastrointestinal: Soft and nontender. No distention.  Musculoskeletal: Right hip pain with very gentle palpation.  Right hip external rotation and shortening. Neurologic:  Normal speech and language. No gross focal neurologic deficits are appreciated.  Skin:  Skin is warm, dry and intact. No rash noted. Psychiatric: Mood and affect are normal. Speech and behavior are normal.  ____________________________________________   LABS (all labs ordered are listed, but only abnormal results are displayed)  Labs Reviewed  BASIC METABOLIC PANEL - Abnormal; Notable for the following components:      Result Value   Sodium 132 (*)    Glucose, Bld 109 (*)    Creatinine, Ser 1.34 (*)    Calcium 8.5 (*)    GFR calc non Af Amer 44 (*)    GFR calc Af Amer 51 (*)    All other components within normal limits  CBC - Abnormal; Notable for the following components:   RDW 15.0 (*)    All other components within normal limits  TROPONIN I   ____________________________________________  EKG  ED ECG REPORT I, [place] N Deuce Paternoster, the attending physician, personally viewed and interpreted this ECG.   Date: 11/03/2017  EKG Time: 1:22 AM  Rate: 40  Rhythm: Junctional rhythm  Axis: Normal  Intervals: Normal  ST&T Change: None  ____________________________________________  RADIOLOGY I, Manson N Deanthony Maull, personally viewed and evaluated these images (plain radiographs) as part of my medical decision making, as well as reviewing the written  report by the radiologist.  ED MD interpretation: Retrocardiac hiatal hernia noted on chest x-ray per radiologist.  Right hip x-ray: Comminuted right femoral neck fracture with displacement  Official radiology report(s): Dg Chest Port 1 View  Result Date: 11/03/2017 CLINICAL DATA:  Fall.  Preop. EXAM: PORTABLE CHEST 1 VIEW COMPARISON:  Radiograph 09/15/2014 FINDINGS: Stable cardiomegaly and mediastinal contours. Aortic atherosclerosis. Probable retrocardiac hiatal hernia. No consolidation, pleural effusion or pneumothorax. No acute osseous abnormalities are seen. IMPRESSION: 1. No acute abnormality. 2. Stable cardiomegaly with aortic atherosclerosis. 3. Probable retrocardiac hiatal hernia. Electronically Signed   By: Rubye Oaks M.D.   On: 11/03/2017 02:40   Dg Hip Unilat W Or Wo Pelvis 2-3 Views Right  Result Date: 11/03/2017 CLINICAL DATA:  Fall while walking dog.  Right hip pain. EXAM: DG HIP (WITH OR WITHOUT PELVIS) 2-3V RIGHT COMPARISON:  None. FINDINGS:  Comminuted displaced intertrochanteric right hip fracture. There is mild apex lateral angulation of dominant fracture fragments. Displacement involves the lesser trochanter. Femoral head remains located. Pubic rami are intact. Pubic symphysis and sacroiliac joints are congruent. IMPRESSION: Comminuted displaced intertrochanteric right proximal femur fracture. Electronically Signed   By: Rubye Oaks M.D.   On: 11/03/2017 02:41    __________________  Procedures   ____________________________________________   INITIAL IMPRESSION / ASSESSMENT AND PLAN / ED COURSE  As part of my medical decision making, I reviewed the following data within the electronic MEDICAL RECORD NUMBER   84 year old male presenting to the emergency department following accidental fall with right hip pain.  Clinical exam consistent with a right hip fracture which was confirmed with a right hip x-ray which revealed a comminuted right intertrochanteric fracture  with displacement.  Patient discussed with Dr. Ernest Pine orthopedic surgeon on-call.  Patient also discussed with Dr. Stacie Acres hospitalist on-call who was also notified of the patient's junctional rhythm which is bradycardic at a rate of 40. ____________________________________________  FINAL CLINICAL IMPRESSION(S) / ED DIAGNOSES  Final diagnoses:  Closed comminuted intertrochanteric fracture of right femur, initial encounter (HCC)  Junctional rhythm     MEDICATIONS GIVEN DURING THIS VISIT:  Medications  morphine 4 MG/ML injection 4 mg (0 mg Intravenous Hold 11/03/17 0214)  morphine 2 MG/ML injection 2 mg (2 mg Intravenous Given 11/03/17 0130)  ondansetron (ZOFRAN) injection 4 mg (4 mg Intravenous Given 11/03/17 0130)  sodium chloride 0.9 % bolus 1,000 mL (1,000 mLs Intravenous New Bag/Given 11/03/17 0131)     ED Discharge Orders    None       Note:  This document was prepared using Dragon voice recognition software and may include unintentional dictation errors.    Darci Current, MD 11/03/17 [phone]

## 2017-11-03 NOTE — Consult Note (Signed)
Cardiology Consult    Patient ID: Justin Hendrix MRN: [id number], DOB/AGE: 82-Oct-1924   Admit date: 11/03/2017 Date of Consult: 11/03/2017  Primary Physician: Jaclyn Shaggy, MD Primary Cardiologist: Julien Nordmann, MD Requesting Provider: Ozella Rocks, MD  Patient Profile    Justin Hendrix is a 84 y.o. male with a history of parkinsons, BPH, TIA, and unsteady gait, who is being seen today for the evaluation of complete heart block and presyncope at the request of Dr. Elisabeth Pigeon.  Past Medical History   Past Medical History:  Diagnosis Date  . Benign prostatic hypertrophy   . Chest pain    a. 04/2014 Echo: EF 50-55%, mild LVH, nl RV fxn, mild to mod AS, mild AI, mild TR; b. 04/2014 MV: nl EF. No ischemia.  . Chronic prostatitis   . Elevated PSA   . Incomplete bladder emptying   . Parkinson's disease (HCC)   . Pre-syncope   . TIA (transient ischemic attack)   . Unsteady gait   . Urinary frequency     History reviewed. No pertinent surgical history.   Allergies  No Known Allergies  History of Present Illness    84 y/o ? with a history of Parkinson's, BPH, chronic prostatitis, TIA, and unsteady gait.  He was evaluated by our team in 2015 in the setting of chest pain and dyspnea.  Echocardiogram that time showed normal LV function.  Stress testing was nonischemic.  He has not required follow-up since.  He lives locally and his 2 sons live with him.  He has a dog.  He is not very active but does ambulate some with a walker or a cane.  He says that over the past 2 months or so, he has been experiencing intermittent lightheadedness that typically occurs while standing, about once a week, lasting just a few seconds, and resolving spontaneously.  This is often associated with a sensation of profound weakness.  He says that a few weeks ago he was in his kitchen and suddenly felt weak and his legs gave out and he fell.  He did not sustain any significant trauma.  Earlier this morning,  sometime around 1 AM, he says that his dog woke him up to take him out.  He used his walker to walk towards his back door.  He then open the doors of the dog could run out.  Though notes indicate he got tangled in a leash, he does not even use a lesion with his dog.  He is very clear that he did not lose his balance.  He stepped outside without his walker and was standing on his patio just waiting for his dog to come back.  He then turned to go back in the house and had sudden lightheadedness and felt his legs feel very weak and he fell to the ground, immediately injuring his right hip.  He called for his son's who helped him inside.  EMS was called.  He was taken to the emergency department where he was found to be in complete heart block.  He was hemodynamically stable.  He was found to have a comminuted displaced intertrochanteric right proximal femur fracture.  He has remained on telemetry overnight and for the most part, has been having heart rates in the 40s and complete heart block though intermittently, he appears to have better conduction with 2-1 heart block and occasional junctional escaped.  He has been asymptomatic throughout the night.  We have been asked to evaluate as  he is pending hip surgery.  On further questioning, he says he intermittently experience chest discomfort and dyspnea on exertion though this is generally fairly brief.  Activity overall is very limited.  Inpatient Medications    . amantadine  100 mg Oral BID  . amLODipine  2.5 mg Oral Daily  . aspirin EC  81 mg Oral Daily  . docusate sodium  100 mg Oral BID  . finasteride  5 mg Oral Daily  . fluticasone  1 spray Each Nare Daily  . mometasone-formoterol  2 puff Inhalation BID  . pantoprazole  40 mg Oral Daily  . sertraline  25 mg Oral Daily  . tamsulosin  0.4 mg Oral Daily    Family History    Family History  Problem Relation Age of Onset  . Hypertension Father    indicated that his mother is deceased. He indicated  that his father is deceased.  Social History    Social History   Socioeconomic History  . Marital status: Widowed    Spouse name: Not on file  . Number of children: Not on file  . Years of education: Not on file  . Highest education level: Not on file  Occupational History  . Not on file  Social Needs  . Financial resource strain: Not on file  . Food insecurity:    Worry: Not on file    Inability: Not on file  . Transportation needs:    Medical: Not on file    Non-medical: Not on file  Tobacco Use  . Smoking status: Former Games developermoker  . Smokeless tobacco: Current User    Types: Chew  . Tobacco comment: smoked for a few yrs while in the [employer]  Substance and Sexual Activity  . Alcohol use: No  . Drug use: No  . Sexual activity: Not on file  Lifestyle  . Physical activity:    Days per week: Not on file    Minutes per session: Not on file  . Stress: Not on file  Relationships  . Social connections:    Talks on phone: Not on file    Gets together: Not on file    Attends religious service: Not on file    Active member of club or organization: Not on file    Attends meetings of clubs or organizations: Not on file    Relationship status: Not on file  . Intimate partner violence:    Fear of current or ex partner: Not on file    Emotionally abused: Not on file    Physically abused: Not on file    Forced sexual activity: Not on file  Other Topics Concern  . Not on file  Social History Narrative   [place] in [place] 2 sons ("they live with me!").  Unsteady gait.  Uses walker/cane to get around.  Does not routinely exercise.     Review of Systems    General:  No chills, fever, night sweats or weight changes.  Cardiovascular:  +++ chest pain, +++ dyspnea on exertion, +++ presyncope, no edema, orthopnea, palpitations, paroxysmal nocturnal dyspnea. Dermatological: No rash, lesions/masses Respiratory: No cough, +++ dyspnea Urologic: No hematuria, dysuria Abdominal:   No  nausea, vomiting, diarrhea, bright red blood per rectum, melena, or hematemesis Neurologic:  No visual changes, +++ wkns and unsteady gait, no changes in mental status. All other systems reviewed and are otherwise negative except as noted above.  Physical Exam    Blood pressure 118/70, pulse (!) 41, temperature 98.2  F (36.8 C), resp. rate 16, height 5\' 10"  (1.778 m), weight 200 lb (90.7 kg), SpO2 96 %.  General: Pleasant, NAD Psych: Normal affect. Neuro: Alert and oriented X 3. Moves all extremities spontaneously. HEENT: Normal  Neck: Supple without bruits or JVD. Lungs:  Resp regular and unlabored, CTA. Heart: Irreg, brady, no s3, s4, or murmurs. Abdomen: Soft, non-tender, non-distended, BS + x 4.  Extremities: No clubbing, cyanosis or edema. DP/PT/Radials 2+ and equal bilaterally.  Labs    Recent Labs    11/03/17 0124  TROPONINI <0.03   Lab Results  Component Value Date   WBC 10.8 (H) 11/03/2017   HGB 12.9 (L) 11/03/2017   HCT 37.5 (L) 11/03/2017   MCV 91.5 11/03/2017   PLT 226 11/03/2017    Recent Labs  Lab 11/03/17 0711  NA 131*  K 4.3  CL 103  CO2 24  BUN 21*  CREATININE 1.44*  CALCIUM 8.0*  GLUCOSE 126*     Radiology Studies    Dg Chest Port 1 View  Result Date: 11/03/2017 CLINICAL DATA:  Fall.  Preop. EXAM: PORTABLE CHEST 1 VIEW COMPARISON:  Radiograph 09/15/2014 FINDINGS: Stable cardiomegaly and mediastinal contours. Aortic atherosclerosis. Probable retrocardiac hiatal hernia. No consolidation, pleural effusion or pneumothorax. No acute osseous abnormalities are seen. IMPRESSION: 1. No acute abnormality. 2. Stable cardiomegaly with aortic atherosclerosis. 3. Probable retrocardiac hiatal hernia. Electronically Signed   By: Rubye Oaks M.D.   On: 11/03/2017 02:40   Dg Hip Unilat W Or Wo Pelvis 2-3 Views Right  Result Date: 11/03/2017 CLINICAL DATA:  Fall while walking dog.  Right hip pain. EXAM: DG HIP (WITH OR WITHOUT PELVIS) 2-3V RIGHT COMPARISON:   None. FINDINGS: Comminuted displaced intertrochanteric right hip fracture. There is mild apex lateral angulation of dominant fracture fragments. Displacement involves the lesser trochanter. Femoral head remains located. Pubic rami are intact. Pubic symphysis and sacroiliac joints are congruent. IMPRESSION: Comminuted displaced intertrochanteric right proximal femur fracture. Electronically Signed   By: Rubye Oaks M.D.   On: 11/03/2017 02:41    ECG & Cardiac Imaging    Complete heart block, 40, right bundle branch block.  Assessment & Plan    1.  Presyncope with intermittent complete heart block: Patient presented in the early morning hours today after experiencing presyncope and falling, fracturing his right proximal femur.  He was found to be bradycardic and in complete heart block.  He intermittently has Mobitz 2 with 2-1 conduction and occasional junctional escaped but largely overnight, he has had A-V dissociation.  He is not on any AV nodal blocking agents and electrolytes are within normal limits.  On further questioning, he does report a 2+ month history of intermittent lightheadedness followed by profound weakness.  He has fallen on one other occasion but has never actually lost consciousness.  Patient is in need of right hip surgery.  He has been seen by Ortho here.  It is our view that he will require permanent pacemaker placement prior to proceeding with hip surgery.  In that setting, we will help to arrange for transfer to [facility] so that he may be seen by our electrophysiology team with pacemaker placement later this afternoon.    2.  Preoperative cardiovascular evaluation/right proximal femur fracture: Patient has very limited activity at home and does have a prior history of chest pain and dyspnea with negative ischemic evaluation in 2015 and normal LV function by echo at that time.  Given advanced age and symptoms, he is moderate  to high risk for any surgery, even following  pacemaker placement.  Ischemic testing is not likely to alter his risk dramatically and therefore we will not pursue.  Orthopedics will need to be consulted upon patient's arrival at [facility].     Signed, Nicolasa Ducking, NP 11/03/2017, 11:30 AM  For questions or updates, please contact   Please consult [email or web address] for contact info under Cardiology/STEMI.

## 2017-11-04 ENCOUNTER — Inpatient Hospital Stay (HOSPITAL_COMMUNITY): Payer: Medicare Other

## 2017-11-04 ENCOUNTER — Inpatient Hospital Stay (HOSPITAL_COMMUNITY): Payer: Medicare Other | Admitting: Certified Registered"

## 2017-11-04 ENCOUNTER — Encounter (HOSPITAL_COMMUNITY)
Admission: EM | Disposition: A | Discharge status: Skilled Nursing Facility | Payer: Self-pay | Source: Home / Self Care | Attending: Internal Medicine

## 2017-11-04 HISTORY — PX: INTRAMEDULLARY (IM) NAIL INTERTROCHANTERIC: SHX5875

## 2017-11-04 LAB — CREATININE, SERUM
CREATININE: 1.42 mg/dL — AB (ref 0.61–1.24)
GFR, EST AFRICAN AMERICAN: 48 mL/min — AB (ref >60)
GFR, EST NON AFRICAN AMERICAN: 41 mL/min — AB (ref >60)

## 2017-11-04 LAB — CBC
HCT: 36.6 % — ABNORMAL LOW (ref 39.0–52.0)
HEMATOCRIT: 35.9 % — AB (ref 39.0–52.0)
HEMOGLOBIN: 11.7 g/dL — AB (ref 13.0–17.0)
Hemoglobin: 11.9 g/dL — ABNORMAL LOW (ref 13.0–17.0)
MCH: 29.9 pg (ref 26.0–34.0)
MCH: 30.1 pg (ref 26.0–34.0)
MCHC: 32.6 g/dL (ref 30.0–36.0)
MCHC: 32.5 g/dL (ref 30.0–36.0)
MCV: 91.8 fL (ref 78.0–100.0)
MCV: 92.7 fL (ref 78.0–100.0)
PLATELETS: 189 10*3/uL (ref 150–400)
Platelets: 196 10*3/uL (ref 150–400)
RBC: 3.91 MIL/uL — AB (ref 4.22–5.81)
RBC: 3.95 MIL/uL — AB (ref 4.22–5.81)
RDW: 15.4 % (ref 11.5–15.5)
RDW: 15.2 % (ref 11.5–15.5)
WBC: 10.5 10*3/uL (ref 4.0–10.5)
WBC: 9.2 10*3/uL (ref 4.0–10.5)

## 2017-11-04 LAB — BASIC METABOLIC PANEL
ANION GAP: 10 (ref 5–15)
BUN: 22 mg/dL — ABNORMAL HIGH (ref 6–20)
CO2: 22 mmol/L (ref 22–32)
Calcium: 8.2 mg/dL — ABNORMAL LOW (ref 8.9–10.3)
Chloride: 100 mmol/L — ABNORMAL LOW (ref 101–111)
Creatinine, Ser: 1.58 mg/dL — ABNORMAL HIGH (ref 0.61–1.24)
GFR calc Af Amer: 42 mL/min — ABNORMAL LOW (ref >60)
GFR, EST NON AFRICAN AMERICAN: 36 mL/min — AB (ref >60)
GLUCOSE: 120 mg/dL — AB (ref 65–99)
POTASSIUM: 4.2 mmol/L (ref 3.5–5.1)
Sodium: 132 mmol/L — ABNORMAL LOW (ref 135–145)

## 2017-11-04 LAB — ABO/RH: ABO/RH(D): O NEG

## 2017-11-04 LAB — TYPE AND SCREEN
ABO/RH(D): O NEG
ANTIBODY SCREEN: NEGATIVE

## 2017-11-04 LAB — PROTIME-INR
INR: 1.16
Prothrombin Time: 14.7 seconds (ref 11.4–15.2)

## 2017-11-04 SURGERY — FIXATION, FRACTURE, INTERTROCHANTERIC, WITH INTRAMEDULLARY ROD
Anesthesia: Monitor Anesthesia Care | Laterality: Right

## 2017-11-04 MED ORDER — PROPOFOL 10 MG/ML IV BOLUS
INTRAVENOUS | Status: DC | PRN
  Administered 2017-11-04 (×2): 10 mg via INTRAVENOUS

## 2017-11-04 MED ORDER — FENTANYL CITRATE (PF) 100 MCG/2ML IJ SOLN
25.0000 ug | INTRAMUSCULAR | Status: DC | PRN
  Administered 2017-11-04: 25 ug via INTRAVENOUS

## 2017-11-04 MED ORDER — PHENYLEPHRINE 40 MCG/ML (10ML) SYRINGE FOR IV PUSH (FOR BLOOD PRESSURE SUPPORT)
PREFILLED_SYRINGE | INTRAVENOUS | Status: DC | PRN
  Administered 2017-11-04 (×2): 80 ug via INTRAVENOUS

## 2017-11-04 MED ORDER — ONDANSETRON HCL 4 MG/2ML IJ SOLN
INTRAMUSCULAR | Status: DC | PRN
  Administered 2017-11-04: 4 mg via INTRAVENOUS

## 2017-11-04 MED ORDER — FENTANYL CITRATE (PF) 250 MCG/5ML IJ SOLN
INTRAMUSCULAR | Status: AC
Start: 2017-11-04 — End: ?
  Filled 2017-11-04: qty 5

## 2017-11-04 MED ORDER — CEFAZOLIN SODIUM-DEXTROSE 2-3 GM-%(50ML) IV SOLR
INTRAVENOUS | Status: DC | PRN
  Administered 2017-11-04: 2 g via INTRAVENOUS

## 2017-11-04 MED ORDER — DEXTROSE 5 % IV SOLN
INTRAVENOUS | Status: DC | PRN
  Administered 2017-11-04: 25 ug/min via INTRAVENOUS

## 2017-11-04 MED ORDER — PHENOL 1.4 % MT LIQD: 1.0000 | OROMUCOSAL | Status: DC | PRN

## 2017-11-04 MED ORDER — SODIUM CHLORIDE 0.9 % IV SOLN
INTRAVENOUS | Status: DC
  Administered 2017-11-04: 09:00:00 via INTRAVENOUS

## 2017-11-04 MED ORDER — ENOXAPARIN SODIUM 40 MG/0.4ML ~~LOC~~ SOLN
40.0000 mg | SUBCUTANEOUS | Status: DC
  Administered 2017-11-05 – 2017-11-07 (×3): 40 mg via SUBCUTANEOUS
  Filled 2017-11-04 (×2): qty 0.4

## 2017-11-04 MED ORDER — FENTANYL CITRATE (PF) 100 MCG/2ML IJ SOLN
INTRAMUSCULAR | Status: AC
  Filled 2017-11-04: qty 2

## 2017-11-04 MED ORDER — 0.9 % SODIUM CHLORIDE (POUR BTL) OPTIME
TOPICAL | Status: DC | PRN
  Administered 2017-11-04: 1000 mL

## 2017-11-04 MED ORDER — ONDANSETRON HCL 4 MG/2ML IJ SOLN: 4.0000 mg | Freq: Four times a day (QID) | INTRAMUSCULAR | Status: DC | PRN

## 2017-11-04 MED ORDER — OXYCODONE HCL 5 MG PO TABS: 5.0000 mg | ORAL_TABLET | ORAL | Status: DC | PRN

## 2017-11-04 MED ORDER — CEFAZOLIN SODIUM-DEXTROSE 2-4 GM/100ML-% IV SOLN
2.0000 g | Freq: Four times a day (QID) | INTRAVENOUS | Status: AC
  Administered 2017-11-04 (×2): 2 g via INTRAVENOUS
  Filled 2017-11-04 (×2): qty 100

## 2017-11-04 MED ORDER — PROPOFOL 500 MG/50ML IV EMUL
INTRAVENOUS | Status: DC | PRN
Start: 2017-11-04 — End: 2017-11-04
  Administered 2017-11-04: 50 ug/kg/min via INTRAVENOUS

## 2017-11-04 MED ORDER — BUPIVACAINE IN DEXTROSE 0.75-8.25 % IT SOLN
INTRATHECAL | Status: DC | PRN
  Administered 2017-11-04: 12 mg via INTRATHECAL

## 2017-11-04 MED ORDER — FENTANYL CITRATE (PF) 250 MCG/5ML IJ SOLN
INTRAMUSCULAR | Status: DC | PRN
  Administered 2017-11-04: 50 ug via INTRAVENOUS

## 2017-11-04 MED ORDER — DOCUSATE SODIUM 100 MG PO CAPS
100.0000 mg | ORAL_CAPSULE | Freq: Two times a day (BID) | ORAL | Status: DC
  Administered 2017-11-04 – 2017-11-07 (×6): 100 mg via ORAL
  Filled 2017-11-04 (×6): qty 1

## 2017-11-04 MED ORDER — ONDANSETRON HCL 4 MG PO TABS: 4.0000 mg | ORAL_TABLET | Freq: Four times a day (QID) | ORAL | Status: DC | PRN

## 2017-11-04 MED ORDER — MENTHOL 3 MG MT LOZG
1.0000 | LOZENGE | OROMUCOSAL | Status: DC | PRN
  Filled 2017-11-04: qty 9

## 2017-11-04 MED ORDER — ACETAMINOPHEN 500 MG PO TABS
1000.0000 mg | ORAL_TABLET | Freq: Four times a day (QID) | ORAL | Status: AC
  Administered 2017-11-04 – 2017-11-05 (×3): 1000 mg via ORAL
  Filled 2017-11-04 (×4): qty 2

## 2017-11-04 SURGICAL SUPPLY — 40 items
BIT DRILL 4.3MMS DISTAL GRDTED (BIT) IMPLANT
BNDG COHESIVE 4X5 TAN STRL (GAUZE/BANDAGES/DRESSINGS) ×3 IMPLANT
BNDG GAUZE ELAST 4 BULKY (GAUZE/BANDAGES/DRESSINGS) ×3 IMPLANT
CLOSURE STERI-STRIP 1/2X4 (GAUZE/BANDAGES/DRESSINGS) ×1
CLSR STERI-STRIP ANTIMIC 1/2X4 (GAUZE/BANDAGES/DRESSINGS) ×2 IMPLANT
COVER PERINEAL POST (MISCELLANEOUS) ×3 IMPLANT
COVER SURGICAL LIGHT HANDLE (MISCELLANEOUS) ×3 IMPLANT
DRAPE STERI IOBAN 125X83 (DRAPES) ×3 IMPLANT
DRESSING AQUACEL AG SP 3.5X10 (GAUZE/BANDAGES/DRESSINGS) IMPLANT
DRILL 4.3MMS DISTAL GRADUATED (BIT) ×3
DRSG AQUACEL AG SP 3.5X10 (GAUZE/BANDAGES/DRESSINGS) ×3
DRSG MEPILEX BORDER 4X4 (GAUZE/BANDAGES/DRESSINGS) ×9 IMPLANT
DURAPREP 26ML APPLICATOR (WOUND CARE) ×3 IMPLANT
ELECT REM PT RETURN 9FT ADLT (ELECTROSURGICAL) ×3
ELECTRODE REM PT RTRN 9FT ADLT (ELECTROSURGICAL) ×1 IMPLANT
GLOVE BIOGEL PI IND STRL 8 (GLOVE) ×1 IMPLANT
GLOVE BIOGEL PI INDICATOR 8 (GLOVE) ×2
GLOVE BIOGEL PI ORTHO PRO SZ8 (GLOVE)
GLOVE ECLIPSE 8.0 STRL XLNG CF (GLOVE) ×6 IMPLANT
GLOVE PI ORTHO PRO STRL SZ8 (GLOVE) IMPLANT
GLOVE SURG ORTHO 8.0 STRL STRW (GLOVE) IMPLANT
GOWN STRL REUS W/ TWL LRG LVL3 (GOWN DISPOSABLE) ×2 IMPLANT
GOWN STRL REUS W/TWL 2XL LVL3 (GOWN DISPOSABLE) IMPLANT
GOWN STRL REUS W/TWL LRG LVL3 (GOWN DISPOSABLE) ×4
GUIDEWIRE BALL NOSE 100CM (WIRE) ×2 IMPLANT
HFN LAG SCREW 10.5MM X 115MM (Orthopedic Implant) ×2 IMPLANT
HFN RH 130 DEG 11MM X 420MM (Nail) ×2 IMPLANT
KIT TURNOVER KIT B (KITS) ×3 IMPLANT
MANIFOLD NEPTUNE II (INSTRUMENTS) ×3 IMPLANT
NS IRRIG 1000ML POUR BTL (IV SOLUTION) ×3 IMPLANT
PACK GENERAL/GYN (CUSTOM PROCEDURE TRAY) ×3 IMPLANT
PAD ARMBOARD 7.5X6 YLW CONV (MISCELLANEOUS) ×3 IMPLANT
SCREW BONE CORTICAL 5.0X52 (Screw) ×2 IMPLANT
SUT MNCRL AB 4-0 PS2 18 (SUTURE) ×3 IMPLANT
SUT VIC AB 0 CT1 27 (SUTURE) ×2
SUT VIC AB 0 CT1 27XBRD ANBCTR (SUTURE) ×1 IMPLANT
SUT VIC AB 2-0 CT1 27 (SUTURE) ×2
SUT VIC AB 2-0 CT1 TAPERPNT 27 (SUTURE) ×1 IMPLANT
TOWEL OR 17X24 6PK STRL BLUE (TOWEL DISPOSABLE) ×3 IMPLANT
TOWEL OR 17X26 10 PK STRL BLUE (TOWEL DISPOSABLE) ×3 IMPLANT

## 2017-11-04 NOTE — Interval H&P Note (Signed)
Discussed case, risks and benefits with patient again.  All questions answered, no change to history.  Joevanni Roddey MD  

## 2017-11-04 NOTE — Op Note (Signed)
Orthopaedic Surgery Operative Note (CSN: [id number])  Justin Hendrix  08/19/1922 Date of Surgery: 11/03/2017 - 11/04/2017   Diagnoses:  R intertrochanteric femur fracture  Procedure: R cephalomedullary nail [id number]   Operative Finding Successful completion of planned procedure.  Behaved as a subtroch requiring a clamp to reduce flexion of proximal segment.  Post-operative plan: The patient will be WBAT.  The patient will be admitted back to medicine.  DVT prophylaxis lovenox 40mg  qd.  Pain control with PRN pain medication preferring oral medicines.  Follow up plan will be scheduled in approximately 14 days for incision check and XR, absorbable sutures.  Post-Op Diagnosis: Same Surgeons:Primary: Bjorn PippinVarkey, Dax T, MD Assistants:Brandon Juan QuamParry OPA [facility] Anesthesia: General Antibiotics: Ancef 2g preop Tourniquet time: * No tourniquets in log * Estimated Blood Loss: 100 Complications: None Specimens: None Implants: Implant Name Type Inv. Item Serial No. Manufacturer Lot No. LRB No. Used Action  HFN RH 130 DEG 11MM X 420MM - [id number] Nail HFN RH 130 DEG 11MM X 420MM  [employer](ORTH,TRAU,BIO,SG) [id number] R Right 1 Implanted  HFN LAG SCREW 10.5MM X 115MM - [id number] Orthopedic Implant HFN LAG SCREW 10.5MM X 115MM  [employer](ORTH,TRAU,BIO,SG) [id number] Right 1 Implanted  SCREW BONE CORTICAL 5.0X52 - [id number] Screw SCREW BONE CORTICAL 5.0X52  [employer](ORTH,TRAU,BIO,SG) [id number] Right 1 Implanted   Affixus 11mm x 420 nail, 115mm lag screw, 52mm interlock  Indications for Surgery:   Justin Hendrix is a 84 y.o. male with fall resulting in 4 part proximal femoral fracture and with cardiac issues necess[facility] pac[facility].  Benefits and risks of operative and nonoperative management were discussed prior to surgery with patient/guardian(s) and informed consent form was completed.  Specific risks including infection, need for additional surgery,  non-union, continued pain, mortality   Procedure:   The patient was identified in the preoperative holding area where the surgical site was marked. The patient was taken to the OR where a procedural timeout was called and the above noted anesthesia was induced.   Preoperative antibiotics were dosed.  The patient's right hip was prepped and draped in the usual sterile fashion.  A second preoperative timeout was called.      The patient was placed supine on a fracture table and appropriate reduction was obtained and visualized on fluoroscopy prior to the beginning of the procedure.  We made an incision proximal to the greater trochanter and dissected down through the fascia.  We then carefully placed our starting awl under fluoroscopy prior to advancing the awl into the bone and sliding a ball-tipped guidewire through the awl into the femoral canal. 15 mm entry reamer was used to open the proximal fragment. The wire was passed to an appropriate level at the fascial scar of the distal femur and measurement was obtained proximally using fluoroscopy.  We selected a length of nail noted above.  We noted that there was flexion of the proximal fragment and used a clamp through our incision for our cephalomedullary screw and reduced this flexion prior to placing the screw itself with a derotational pin to avoid spinning the basacervical fragement.   At this point we placed our nail localizing under fluoroscopy that it was at the appropriate level prior to using the outrigger device to pass a wire and then the cephalo-medullary screw.  We compressed the screw using the outrigger.  The screw was locked proximally to avoid over collapse.  We took final shots at the proximal femur and  then used perfect circle technique to place one distal interlock screw.  Final pictures were obtained.  The wounds were thoroughly irrigated closed in a multilayer fashion with absorable sutures.  A sterile dressing was placed.  The  patient was awoken from general anesthesia and taken to the PACU in stable condition without complication.    Janace Litten, OPA-C, present and scrubbed throughout the case, critical for completion in a timely fashion, and for retraction, instrumentation, closure.

## 2017-11-04 NOTE — Anesthesia Procedure Notes (Signed)
Spinal  Patient location during procedure: OR Start time: 11/04/2017 9:32 AM End time: 11/04/2017 9:36 AM Staffing Anesthesiologist: Gaynelle AduFitzgerald, Jazzmyn Filion, MD Performed: anesthesiologist  Preanesthetic Checklist Completed: patient identified, surgical consent, pre-op evaluation, timeout performed, IV checked, risks and benefits discussed and monitors and equipment checked Spinal Block Patient position: left lateral decubitus Prep: DuraPrep Patient monitoring: cardiac monitor, continuous pulse ox and blood pressure Approach: midline Location: L3-4 Injection technique: single-shot Needle Needle type: Pencan  Needle gauge: 24 G Needle length: 9 cm Assessment Sensory level: T8 Additional Notes Functioning IV was confirmed and monitors were applied. Sterile prep and drape, including hand hygiene and sterile gloves were used. The patient was positioned and the spine was prepped. The skin was anesthetized with lidocaine.  Free flow of clear CSF was obtained prior to injecting local anesthetic into the CSF.  The spinal needle aspirated freely following injection.  The needle was carefully withdrawn.  The patient tolerated the procedure well.

## 2017-11-04 NOTE — Anesthesia Postprocedure Evaluation (Signed)
Anesthesia Post Note  Patient: CHISOM AUST  Procedure(s) Performed: INTRAMEDULLARY (IM) NAIL INTERTROCHANTRIC (Right )     Patient location during evaluation: PACU Anesthesia Type: MAC Level of consciousness: oriented and awake and alert Pain management: pain level controlled Vital Signs Assessment: post-procedure vital signs reviewed and stable Respiratory status: spontaneous breathing, respiratory function stable and patient connected to nasal cannula oxygen Cardiovascular status: blood pressure returned to baseline and stable Postop Assessment: no headache, no backache and no apparent nausea or vomiting Anesthetic complications: no    Last Vitals:  Vitals:   11/04/17 1245 11/04/17 1250  BP:  (!) 159/92  Pulse: 90   Resp: 20   Temp: (!) 36.3 C   SpO2: 99%     Last Pain:  Vitals:   11/04/17 1245  TempSrc:   PainSc: Asleep                 Malai Lady,W. EDMOND

## 2017-11-04 NOTE — Progress Notes (Signed)
Pt has returned to the unit, son at the bedside. Updates given to son. Freq VS set up pt resting comfortably. Hospitaist also at bedside to see pt.

## 2017-11-04 NOTE — Progress Notes (Signed)
Progress Note   Subjective   Doing well today, the patient denies CP or SOB.  Planned for hip surgery today.  No new concerns  Inpatient Medications    Scheduled Meds: . [MAR Hold] amantadine  100 mg Oral BID  . [MAR Hold] finasteride  5 mg Oral Daily  . [MAR Hold] sertraline  100 mg Oral Daily  . [MAR Hold] sodium chloride flush  3 mL Intravenous Q12H  . [MAR Hold] tamsulosin  0.4 mg Oral QPC supper   Continuous Infusions: . [MAR Hold] sodium chloride    . sodium chloride 50 mL/hr at 11/03/17 2250  . sodium chloride 10 mL/hr at 11/04/17 0848  . [MAR Hold]  ceFAZolin (ANCEF) IV Stopped (11/04/17 0449)   PRN Meds: [MAR Hold] sodium chloride, [MAR Hold] acetaminophen, [MAR Hold] bisacodyl, [MAR Hold] HYDROcodone-acetaminophen, [MAR Hold]  morphine injection, [MAR Hold] ondansetron (ZOFRAN) IV, [MAR Hold] polyethylene glycol, [MAR Hold] senna-docusate, [MAR Hold] sodium chloride flush   Vital Signs    Vitals:   11/03/17 1955 11/04/17 0028 11/04/17 0415 11/04/17 0745  BP: (!) 148/102 (!) 146/93 131/78 127/72  Pulse: (!) 103 94 (!) 59 (!) 59  Resp: 20 18 18    Temp: 98.3 F (36.8 C) 98.3 F (36.8 C) (!) 97.4 F (36.3 C)   TempSrc: Oral Oral Oral   SpO2: 92% 97% 91% 92%  Weight:   198 lb (89.8 kg)   Height:        Intake/Output Summary (Last 24 hours) at 11/04/2017 0856 Last data filed at 11/04/2017 0419 Gross per 24 hour  Intake 340 ml  Output 375 ml  Net -35 ml   Filed Weights   11/03/17 1658 11/04/17 0415  Weight: 206 lb 2.1 oz (93.5 kg) 198 lb (89.8 kg)    Telemetry    Afib, V paced - Personally Reviewed  Physical Exam   GEN- The patient is eldelry appearing, alert   Head- normocephalic, atraumatic Eyes-  Sclera clear, conjunctiva pink Ears- hearing intact Oropharynx- clear Neck- supple, Lungs- Clear to ausculation bilaterally, normal work of breathing Heart- Regular rate and rhythm (paced) GI- soft, NT, ND, + BS Extremities- no clubbing, cyanosis,  or edema  Psych- euthymic mood, full affect Neuro- strength and sensation are intact   Labs    Chemistry Recent Labs  Lab 11/03/17 0124 11/03/17 0711 11/04/17 0746  NA 132* 131* 132*  K 3.9 4.3 4.2  CL 101 103 100*  CO2 25 24 22   GLUCOSE 109* 126* 120*  BUN 20 21* 22*  CREATININE 1.34* 1.44* 1.58*  CALCIUM 8.5* 8.0* 8.2*  GFRNONAA 44* 40* 36*  GFRAA 51* 47* 42*  ANIONGAP 6 4* 10     Hematology Recent Labs  Lab 11/03/17 0124 11/03/17 0711 11/04/17 0746  WBC 8.1 10.8* 10.5  RBC 4.65 4.09* 3.91*  HGB 14.5 12.9* 11.7*  HCT 42.0 37.5* 35.9*  MCV 90.2 91.5 91.8  MCH 31.1 31.5 29.9  MCHC 34.5 34.4 32.6  RDW 15.0* 15.3* 15.4  PLT 224 226 196    Cardiac Enzymes Recent Labs  Lab 11/03/17 0124  TROPONINI <0.03   No results for input(s): TROPIPOC in the last 168 hours.      Assessment & Plan    1.  Complete heart block Normal pacemaker function Device interrogation is reviewed CXR reveals stable leads, no ptx  2. afib New onset In current situation, not presently a candidate for anticoagulation.  Can reassess in the outpatient setting post hip surgery Rate  controlled No intervention at this time  3. Hip fx Plans for surgery noted  No further inpatient CV workup planned Will schedule outpatient wound check  Call with questions  Hillis Range MD, [facility] 11/04/2017 8:56 AM

## 2017-11-04 NOTE — Progress Notes (Signed)
X-ray in progress 

## 2017-11-04 NOTE — Transfer of Care (Signed)
Immediate Anesthesia Transfer of Care Note  Patient: Justin Hendrix  Procedure(s) Performed: INTRAMEDULLARY (IM) NAIL INTERTROCHANTRIC (Right )  Patient Location: PACU  Anesthesia Type:MAC and Spinal  Level of Consciousness: awake  Airway & Oxygen Therapy: Patient Spontanous Breathing and Patient connected to nasal cannula oxygen  Post-op Assessment: Report given to RN and Post -op Vital signs reviewed and stable  Post vital signs: Reviewed and stable  Last Vitals:  Vitals Value Taken Time  BP 117/96 11/04/2017 11:20 AM  Temp    Pulse 82 11/04/2017 11:19 AM  Resp 16 11/04/2017 11:20 AM  SpO2 92 % 11/04/2017 11:19 AM  Vitals shown include unvalidated device data.  Last Pain:  Vitals:   11/04/17 0415  TempSrc: Oral  PainSc:       Patients Stated Pain Goal: 0 (28/41/32 4401)  Complications: No apparent anesthesia complications

## 2017-11-04 NOTE — Progress Notes (Signed)
[facility] Progress Note  Patient: Justin MusaCameron W Hendrix UJW:[id number]   PCP: Jaclyn Shaggyate, Denny C, MD DOB: 08/08/1922   DOA: 11/03/2017   DOS: 11/04/2017   Date of Service: the patient was seen and examined on 11/04/2017  Subjective: Seen after the surgery, under the influence of anesthesia.  Denies any acute complaint.  Tolerated procedure very well.  Brief hospital course: Pt. with PMH of  BPH, Parkinson's disease, prior history of TIA, CKD, hypertension, chronic prostatitis; admitted on 11/03/2017, presented with complaint of fall, was found to have heart block as well as right hip fracture.  S/P permanent pacemaker implant as well as intramedullary nailing placement for right hip fracture. Currently further plan is monitor postop recovery.  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 S/P permanent pacemaker implant. Management per cardiology. Appreciate their input and assistance.  2) right hip fracture.   S/P intramedullary nail implant. Pain appears to be well controlled. Continue pain control. Orthopedic sports medicine Dr. Everardo PacificVarkey has been consulted. Patient S/P intramedullary nail implant.  Tolerated very well.  PT OT recommendation  Cardiology recommended patient would be moderate to high risk for significant cardiovascular outcome perioperatively although recommend no further workup at present given that it will not change the management options. Echocardiogram shows preserved EF without any significant valvular abnormality.  3)  Unwitnessed fall. Patient's son at bedside are not sure whether patient has has had are not. Patient poor historian due to dementia. Unremarkable CT head for any acute trauma.  4) acute hypoxic respiratory failure.  Using 4 L of oxygen at present Likely postop atelectasis. We will monitor for now. Incentive spirometry.  Parkinsons disease Continue Zoloft and Symmetrel   Benign prostatic hypertrophy, chronic prostatitis   no acute issues Continue Flomax and proscar   Chronic kidney disease stage 2-3    baseline creatinine 1.1    Current Cr is 1.3-1.4   Diet: per surgery  DVT Prophylaxis: subcutaneous Heparin  Advance goals of care discussion: full code  Family Communication: family was present at bedside, at the time of interview. The pt provided permission to discuss medical plan with the family. Opportunity was given to ask question and all questions were answered satisfactorily.   Disposition:  Discharge to snf.  Consultants: cardiology ortho Procedures: PPM implant, intramedullary nail implant  Antibiotics: Anti-infectives (From admission, onward)   Start     Dose/Rate Route Frequency Ordered Stop   11/04/17 1400  ceFAZolin (ANCEF) IVPB 2g/100 mL premix     2 g 200 mL/hr over 30 Minutes Intravenous Every 6 hours 11/04/17 1319 11/05/17 0159   11/03/17 2200  ceFAZolin (ANCEF) IVPB 1 g/50 mL premix     1 g 100 mL/hr over 30 Minutes Intravenous Every 6 hours 11/03/17 1710 11/04/17 1559   11/03/17 1545  gentamicin (GARAMYCIN) 80 mg in sodium chloride 0.9 % 500 mL irrigation     80 mg Irrigation On call 11/03/17 1532 11/03/17 1624   11/03/17 1545  ceFAZolin (ANCEF) IVPB 2g/100 mL premix     2 g 200 mL/hr over 30 Minutes Intravenous On call 11/03/17 1532 11/03/17 1610       Objective: Physical Exam: Vitals:   11/04/17 1220 11/04/17 1229 11/04/17 1245 11/04/17 1250  BP: (!) 163/81   (!) 159/92  Pulse: 87 89 90   Resp: 17 16 20    Temp:   (!) 97.3 F (36.3 C)   TempSrc:      SpO2: 95% 96% 99%   Weight:  Height:        Intake/Output Summary (Last 24 hours) at 11/04/2017 1542 Last data filed at 11/04/2017 1200 Gross per 24 hour  Intake 1340 ml  Output 675 ml  Net 665 ml   Filed Weights   11/03/17 1658 11/04/17 0415  Weight: 93.5 kg (206 lb 2.1 oz) 89.8 kg (198 lb)   General: Alert, Awake and Oriented to Person. Appear in mild distress, affect appropriate Eyes: PERRL,  Conjunctiva normal ENT: Oral Mucosa clear moist. Neck: no JVD, no Abnormal Mass Or lumps Cardiovascular: S1 and S2 Present, aortic systolic  Murmur, Peripheral Pulses Present Respiratory: normal respiratory effort, Bilateral Air entry equal and Decreased, no use of accessory muscle, Clear to Auscultation, no Crackles, no wheezes Abdomen: Bowel Sound present, Soft and no tenderness, no hernia Skin: no redness, no Rash, no induration Extremities: no Pedal edema, no calf tenderness Neurologic: Grossly no focal neuro deficit. Bilaterally Equal motor strength  Data Reviewed: CBC: Recent Labs  Lab 11/03/17 0124 11/03/17 0711 11/04/17 0746 11/04/17 1353  WBC 8.1 10.8* 10.5 9.2  HGB 14.5 12.9* 11.7* 11.9*  HCT 42.0 37.5* 35.9* 36.6*  MCV 90.2 91.5 91.8 92.7  PLT 224 226 196 189   Basic Metabolic Panel: Recent Labs  Lab 11/03/17 0124 11/03/17 0711 11/04/17 0746 11/04/17 1353  NA 132* 131* 132*  --   K 3.9 4.3 4.2  --   CL 101 103 100*  --   CO2 25 24 22   --   GLUCOSE 109* 126* 120*  --   BUN 20 21* 22*  --   CREATININE 1.34* 1.44* 1.58* 1.42*  CALCIUM 8.5* 8.0* 8.2*  --     Liver Function Tests: No results for input(s): AST, ALT, ALKPHOS, BILITOT, PROT, ALBUMIN in the last 168 hours. No results for input(s): LIPASE, AMYLASE in the last 168 hours. No results for input(s): AMMONIA in the last 168 hours. Coagulation Profile: Recent Labs  Lab 11/04/17 0746  INR 1.16   Cardiac Enzymes: Recent Labs  Lab 11/03/17 0124  TROPONINI <0.03   BNP (last 3 results) No results for input(s): PROBNP in the last 8760 hours. CBG: No results for input(s): GLUCAP in the last 168 hours. Studies: Ct Head Wo Contrast  Result Date: 11/03/2017 CLINICAL DATA:  84 y/o  M; ataxia and head trauma. EXAM: CT HEAD WITHOUT CONTRAST TECHNIQUE: Contiguous axial images were obtained from the base of the skull through the vertex without intravenous contrast. COMPARISON:  None. FINDINGS: Brain: No  evidence of acute infarction, hemorrhage, hydrocephalus, extra-axial collection or mass lesion/mass effect. Small chronic lacunar infarcts are present within the left thalamus and right caudate head. Moderate chronic microvascular ischemic changes and parenchymal volume loss of the brain. Vascular: Calcific atherosclerosis of the carotid siphons. No hyperdense vessel identified. Skull: Normal. Negative for fracture or focal lesion. Sinuses/Orbits: Partial opacification of right mastoid air cells. Bilateral intra-ocular lens replacement. Otherwise negative. Other: Multiple plaque-like dermal lesions are present scattered diffusely throughout the scalp in greatest concentration in the bilateral temporal fossa. IMPRESSION: 1. No acute intracranial abnormality or calvarial fracture. 2. Chronic lacunar infarct within left thalamus and right caudate head. 3. Moderate chronic microvascular ischemic changes and scratch parenchymal volume loss of the brain. 4. Multiple plaque-like dermal lesions are present diffusely over the scalp in greatest concentration in bilateral temporal fossa. Direct visualization recommended. Electronically Signed   By: Mitzi Hansen M.D.   On: 11/03/2017 21:39   Dg Chest Port 1 View  Result Date: 11/04/2017  CLINICAL DATA:  Pre-op for right femoral nail this AM; hx of pacemaker insertion and TIA; former smoker EXAM: PORTABLE CHEST 1 VIEW COMPARISON:  None. FINDINGS: Mild cardiomegaly. Aortic atherosclerosis. LEFT chest wall pacemaker/ICD apparatus in place. Vague opacity at the LEFT lung base, overlying the LEFT heart border, possibly atelectasis. Lungs otherwise clear. IMPRESSION: 1. Opacity at the LEFT lung base, overlying the LEFT heart border, possibly atelectasis. Pneumonia or aspiration could not be excluded if febrile. Lungs otherwise clear. 2. Mild cardiomegaly. 3. Aortic atherosclerosis. Electronically Signed   By: Bary Richard M.D.   On: 11/04/2017 07:58   Dg C-arm 1-60  Min  Result Date: 11/04/2017 CLINICAL DATA:  Right femoral intramedullary nail placement. EXAM: RIGHT FEMUR 2 VIEWS; DG C-ARM 61-120 MIN COMPARISON:  None. FINDINGS: Intraoperative x-rays demonstrate interval cephalomedullary rod placement for comminuted intertrochanteric right femur fracture. The dominant fragments are now in near anatomic alignment. There is persistent mild medial displacement of the lesser trochanteric fragment. No hardware complication. IMPRESSION: Comminuted intertrochanteric right femur fracture status post ORIF with near anatomic alignment. Persistent mild medial displacement of the lesser trochanteric fragment. FLUOROSCOPY TIME:  2 minutes, 14 seconds. C-arm fluoroscopic images were obtained intraoperatively and submitted for post operative interpretation. Electronically Signed   By: Obie Dredge M.D.   On: 11/04/2017 11:07   Dg Femur, Min 2 Views Right  Result Date: 11/04/2017 CLINICAL DATA:  Right femoral intramedullary nail placement. EXAM: RIGHT FEMUR 2 VIEWS; DG C-ARM 61-120 MIN COMPARISON:  None. FINDINGS: Intraoperative x-rays demonstrate interval cephalomedullary rod placement for comminuted intertrochanteric right femur fracture. The dominant fragments are now in near anatomic alignment. There is persistent mild medial displacement of the lesser trochanteric fragment. No hardware complication. IMPRESSION: Comminuted intertrochanteric right femur fracture status post ORIF with near anatomic alignment. Persistent mild medial displacement of the lesser trochanteric fragment. FLUOROSCOPY TIME:  2 minutes, 14 seconds. C-arm fluoroscopic images were obtained intraoperatively and submitted for post operative interpretation. Electronically Signed   By: Obie Dredge M.D.   On: 11/04/2017 11:07   Dg Femur Port, Min 2 Views Right  Result Date: 11/04/2017 CLINICAL DATA:  Postop repair. EXAM: RIGHT FEMUR PORTABLE 2 VIEW COMPARISON:  None. FINDINGS: An intramedullary rod and gamma  nail has been placed across the right proximal femur/intertrochanteric fracture. The hardware is in good position with a distal interlocking nail. No other acute abnormalities identified. IMPRESSION: Fracture repair as above.  Hardware is in good position. Electronically Signed   By: Gerome Sam III M.D   On: 11/04/2017 12:12    Scheduled Meds: . acetaminophen  1,000 mg Oral Q6H  . amantadine  100 mg Oral BID  . docusate sodium  100 mg Oral BID  . [START ON 11/05/2017] enoxaparin (LOVENOX) injection  40 mg Subcutaneous Q24H  . fentaNYL      . finasteride  5 mg Oral Daily  . sertraline  100 mg Oral Daily  . sodium chloride flush  3 mL Intravenous Q12H  . tamsulosin  0.4 mg Oral QPC supper   Continuous Infusions: . sodium chloride    . sodium chloride 50 mL/hr at 11/03/17 2250  . sodium chloride 10 mL/hr at 11/04/17 0848  .  ceFAZolin (ANCEF) IV Stopped (11/04/17 0449)  .  ceFAZolin (ANCEF) IV 2 g (11/04/17 1534)   PRN Meds: sodium chloride, acetaminophen, bisacodyl, HYDROcodone-acetaminophen, menthol-cetylpyridinium **OR** phenol, morphine injection, ondansetron **OR** ondansetron (ZOFRAN) IV, oxyCODONE, polyethylene glycol, senna-docusate, sodium chloride flush  Time spent: 35 minutes  Author: Lynden Oxford,  MD [facility] Pager: [phone] 11/04/2017 3:42 PM  If 7PM-7AM, please contact night-coverage at [email or web address], password [facility]

## 2017-11-04 NOTE — Anesthesia Preprocedure Evaluation (Signed)
Anesthesia Evaluation  Patient identified by MRN, date of birth, ID band Patient awake    Reviewed: Allergy & Precautions, H&P , NPO status , Patient's Chart, lab work & pertinent test results  Airway Mallampati: III  TM Distance: >3 FB Neck ROM: Full    Dental no notable dental hx. (+) Poor Dentition, Dental Advisory Given   Pulmonary neg pulmonary ROS, former smoker,    Pulmonary exam normal breath sounds clear to auscultation       Cardiovascular + dysrhythmias + pacemaker  Rhythm:Regular Rate:Normal     Neuro/Psych negative neurological ROS  negative psych ROS   GI/Hepatic negative GI ROS, Neg liver ROS,   Endo/Other  negative endocrine ROS  Renal/GU negative Renal ROS  negative genitourinary   Musculoskeletal   Abdominal   Peds  Hematology negative hematology ROS (+)   Anesthesia Other Findings   Reproductive/Obstetrics negative OB ROS                             Anesthesia Physical Anesthesia Plan  ASA: II  Anesthesia Plan: Spinal and MAC   Post-op Pain Management:    Induction: Intravenous  PONV Risk Score and Plan: 2 and Propofol infusion and Ondansetron  Airway Management Planned: Simple Face Mask  Additional Equipment:   Intra-op Plan:   Post-operative Plan:   Informed Consent: I have reviewed the patients History and Physical, chart, labs and discussed the procedure including the risks, benefits and alternatives for the proposed anesthesia with the patient or authorized representative who has indicated his/her understanding and acceptance.   Dental advisory given  Plan Discussed with: CRNA  Anesthesia Plan Comments:         Anesthesia Quick Evaluation

## 2017-11-04 NOTE — Plan of Care (Signed)
  Problem: Coping: Goal: Level of anxiety will decrease Outcome: Completed/Met

## 2017-11-05 LAB — BASIC METABOLIC PANEL
Anion gap: 8 (ref 5–15)
BUN: 33 mg/dL — AB (ref 6–20)
CALCIUM: 7.7 mg/dL — AB (ref 8.9–10.3)
CHLORIDE: 105 mmol/L (ref 101–111)
CO2: 19 mmol/L — AB (ref 22–32)
CREATININE: 1.69 mg/dL — AB (ref 0.61–1.24)
GFR calc Af Amer: 38 mL/min — ABNORMAL LOW (ref >60)
GFR calc non Af Amer: 33 mL/min — ABNORMAL LOW (ref >60)
Glucose, Bld: 128 mg/dL — ABNORMAL HIGH (ref 65–99)
Potassium: 4.3 mmol/L (ref 3.5–5.1)
Sodium: 132 mmol/L — ABNORMAL LOW (ref 135–145)

## 2017-11-05 LAB — CBC
HCT: 29.8 % — ABNORMAL LOW (ref 39.0–52.0)
Hemoglobin: 10 g/dL — ABNORMAL LOW (ref 13.0–17.0)
MCH: 30.7 pg (ref 26.0–34.0)
MCHC: 33.6 g/dL (ref 30.0–36.0)
MCV: 91.4 fL (ref 78.0–100.0)
PLATELETS: 173 10*3/uL (ref 150–400)
RBC: 3.26 MIL/uL — AB (ref 4.22–5.81)
RDW: 15.3 % (ref 11.5–15.5)
WBC: 13 10*3/uL — ABNORMAL HIGH (ref 4.0–10.5)

## 2017-11-05 LAB — MAGNESIUM: MAGNESIUM: 1.9 mg/dL (ref 1.7–2.4)

## 2017-11-05 MED ORDER — ACETAMINOPHEN 500 MG PO TABS: 1000.0000 mg | ORAL_TABLET | Freq: Three times a day (TID) | ORAL | 0 refills | Status: AC

## 2017-11-05 MED ORDER — ENOXAPARIN SODIUM 40 MG/0.4ML ~~LOC~~ SOLN: 40.0000 mg | SUBCUTANEOUS | 1 refills | Status: DC

## 2017-11-05 MED ORDER — SERTRALINE HCL 50 MG PO TABS
50.0000 mg | ORAL_TABLET | Freq: Every day | ORAL | Status: DC
  Administered 2017-11-06 – 2017-11-07 (×2): 50 mg via ORAL
  Filled 2017-11-05 (×2): qty 1

## 2017-11-05 MED ORDER — OXYCODONE HCL 5 MG PO TABS: ORAL_TABLET | ORAL | 0 refills | Status: AC

## 2017-11-05 NOTE — Evaluation (Signed)
Physical Therapy Evaluation Patient Details Name: Justin MusaCameron W Wirkkala MRN: [id number] DOB: 08/29/1922 Today's Date: 11/05/2017   History of Present Illness    Pt. with PMH of BPH, Parkinson's disease, prior history of TIA, CKD, hypertension, chronic prostatitis; admitted on 11/03/2017, presented with complaint of fall, was found to have heart block as well as right hip fracture.  S/P permanent pacemaker implant as well as intramedullary nailing placement for right hip fracture.    Clinical Impression  Pt presents with lethargy limiting eval.  Currently presents with significant limitations to mobility due to lethargy, weakness, limited ROM, postoperative activity precautions and likely pain once able to progress mobility.  Currently unable to substantially participate in therapy efforts due to poor arousal, and will continue efforts to progress as pt becomes more alert.  Based on finding and medical history, suspect pt will require SNF for postacute rehab before transitioning back to home.  Need to confirm with family.  PT will initiate care in acute setting and update d/c recommendations as indicated.      Follow Up Recommendations SNF    Equipment Recommendations  Rolling walker with 5" wheels    Recommendations for Other Services       Precautions / Restrictions Precautions Precautions: Posterior Hip;ICD/Pacemaker Precaution Booklet Issued: Yes (comment)(posterior hip precautions) Precaution Comments: provided handouts with posterior hip precautions and mobility instructions.  to review when pt is more awake. Restrictions Weight Bearing Restrictions: Yes RLE Weight Bearing: Weight bearing as tolerated Other Position/Activity Restrictions: Per ortho MD note, pt is WBAT on R LE following IM nail      Mobility  Bed Mobility Overal bed mobility: Needs Assistance Bed Mobility: Supine to Sit     Supine to sit: Total assist;HOB elevated     General bed mobility comments: attempted  to transition to right EOB, but pt unable to remain alert enough to safely complete. Able to respond to single-step commands but cannot sustain in order to move.  Eval terminated at this point.  Transfers                    Ambulation/Gait                Stairs            Wheelchair Mobility    Modified Rankin (Stroke Patients Only)       Balance                                             Pertinent Vitals/Pain Pain Assessment: Faces Faces Pain Scale: No hurt Pain Location: pt did not demostrate pain with gentle LE ROM    Home Living Family/patient expects to be discharged to:: Private residence Living Arrangements: Children Available Help at Discharge: Family(TBD, no one in room) Type of Home: (TBD  when family present)           Additional Comments: complete home assessemnt when family is present as pt unable to provide answers, however most likely d/c to SNF give extent of mobility limitations    Prior Function                 Hand Dominance        Extremity/Trunk Assessment   Upper Extremity Assessment Upper Extremity Assessment: (LUE limited s/p ppm placement; education needed)    Lower Extremity Assessment Lower Extremity Assessment: RLE  deficits/detail;Generalized weakness RLE Deficits / Details: s/p right IM nail s/p fx; unable to clear heel from bed or flex knee; no indication of pain but pt lethargic       Communication   Communication: Other (comment);HOH(pt lethargic; appears mildly HOH)  Cognition Arousal/Alertness: Lethargic Behavior During Therapy: Flat affect Overall Cognitive Status: Difficult to assess                                        General Comments      Exercises     Assessment/Plan    PT Assessment Patient needs continued PT services  PT Problem List Decreased strength;Decreased range of motion;Decreased activity tolerance;Decreased balance;Decreased  mobility;Decreased cognition;Decreased knowledge of use of DME;Decreased knowledge of precautions;Decreased safety awareness       PT Treatment Interventions DME instruction;Gait training;Functional mobility training;Therapeutic activities;Therapeutic exercise;Patient/family education;Modalities    PT Goals (Current goals can be found in the Care Plan section)  Acute Rehab PT Goals Patient Stated Goal: unable to elicit PT Goal Formulation: Patient unable to participate in goal setting Time For Goal Achievement: 11/19/17 Potential to Achieve Goals: Fair    Frequency Min 3X/week   Barriers to discharge Other (comment)(TBD if family in position to prefer care at home??)      Co-evaluation               AM-PAC PT "6 Clicks" Daily Activity  Outcome Measure Difficulty turning over in bed (including adjusting bedclothes, sheets and blankets)?: Unable Difficulty moving from lying on back to sitting on the side of the bed? : Unable Difficulty sitting down on and standing up from a chair with arms (e.g., wheelchair, bedside commode, etc,.)?: Unable Help needed moving to and from a bed to chair (including a wheelchair)?: Total Help needed walking in hospital room?: Total Help needed climbing 3-5 steps with a railing? : Total 6 Click Score: 6    End of Session   Activity Tolerance: No increased pain;Patient limited by lethargy Patient left: in bed;with call bell/phone within reach;with bed alarm set;with SCD's reapplied Nurse Communication: Precautions;Weight bearing status;Mobility status PT Visit Diagnosis: History of falling (Z91.81);Difficulty in walking, not elsewhere classified (R26.2)    Time: 9604-5409 PT Time Calculation (min) (ACUTE ONLY): 13 min   Charges:   PT Evaluation $PT Eval Low Complexity: 1 Low     PT G Codes:        Narda Amber, PT, DPT, MS Board Certified Geriatric Clinical Specialist   Dennis Bast 11/05/2017, 10:08 AM

## 2017-11-05 NOTE — Progress Notes (Addendum)
ORTHOPAEDIC PROGRESS NOTE  s/p Procedure(s): INTRAMEDULLARY (IM) NAIL INTERTROCHANTRIC  SUBJECTIVE: Reports mild pain about operative site. No chest pain. No SOB. No nausea/vomiting. No other complaints.  OBJECTIVE: PE:  RLE: incision CDI, leg lengths equal, warm well perfused foot, intact EHL/TA/GSC   Vitals:   11/05/17 0420 11/05/17 1148  BP: 105/66 116/74  Pulse: 96 96  Resp: 20 18  Temp: 98.2 F (36.8 C) 97.8 F (36.6 C)  SpO2: 94% 93%     ASSESSMENT: Justin Hendrix is a 84 y.o. male doing well postoperatively.  PLAN: Weightbearing: WBAT RLE Insicional and dressing care: OK to remove dressings 3 and leave open to air with dry gauze PRN Orthopedic device(s): None Showering: prn, dressings water resistant VTE prophylaxis: Lovenox 40mg  qd x6 weeks Pain control: prn meds, minimize narcs Follow - up plan: 2 weeks, ok to dc per primary to snf Contact information:  Weekdays 8-5 [address] Raffaela Ladley MD [phone], After hours and holidays please check [email or web address] for group call information for [facility]

## 2017-11-05 NOTE — Plan of Care (Signed)
  Problem: Nutrition: Goal: Adequate nutrition will be maintained Outcome: Completed/Met   Problem: Elimination: Goal: Will not experience complications related to bowel motility Outcome: Completed/Met Goal: Will not experience complications related to urinary retention Outcome: Completed/Met   Problem: Pain Managment: Goal: General experience of comfort will improve Outcome: Completed/Met

## 2017-11-05 NOTE — Social Work (Addendum)
CSW acknowledging SNF placement recommendation, [facility] [place] before returning [place]. FL2/Assesment complete, sent to preferred facilities.   [facility] before [facility] to [facility].   Doy HutchingIsabel H Dilynn Munroe, LCSWA [facility] Clinical Social Work [phone]

## 2017-11-05 NOTE — Plan of Care (Signed)
  Problem: Activity: Goal: Risk for activity intolerance will decrease Outcome: Progressing   Problem: Safety: Goal: Ability to remain free from injury will improve Outcome: Progressing   

## 2017-11-05 NOTE — Progress Notes (Signed)
Doing well from EP standpoint.  Please DC L arm IV given new PPM on that side.  No new CV recs  Outpatient follow-up scheduled  Electrophysiology team to see as needed while here. Please call with questions.  Hillis RangeJames Adreena Willits MD, [facility] 11/05/2017 11:50 AM

## 2017-11-05 NOTE — Progress Notes (Signed)
[facility] Progress Note  Patient: Justin Hendrix ZOX:[id number]   PCP: Jaclyn Shaggy, MD DOB: 07-05-1923   DOA: 11/03/2017   DOS: 11/05/2017   Date of Service: the patient was seen and examined on 11/05/2017  Subjective: Patient is more fatigue.  Denies any acute complaint.  No acute events overnight.  Brief hospital course: Pt. with PMH of  BPH, Parkinson's disease, prior history of TIA, CKD, hypertension, chronic prostatitis; admitted on 11/03/2017, presented with complaint of fall, was found to have heart block as well as right hip fracture.  S/P permanent pacemaker implant as well as intramedullary nailing placement for right hip fracture. Currently further plan is monitor postop recovery.  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 S/P permanent pacemaker implant. Management per cardiology. Appreciate their input and assistance.  2) right hip fracture.   S/P intramedullary nail implant. Pain appears to be well controlled. Continue pain control. Orthopedic sports medicine Dr. Everardo Pacific has been consulted. Patient S/P intramedullary nail implant.  Tolerated very well.  PT OT recommendation  Cardiology recommended patient would be moderate to high risk for significant cardiovascular outcome perioperatively although recommend no further workup at present given that it will not change the management options. Echocardiogram shows preserved EF without any significant valvular abnormality.  3)  Unwitnessed fall. Patient's son at bedside are not sure whether patient has has had are not. Patient poor historian due to dementia. Unremarkable CT head for any acute trauma.  4) acute hypoxic respiratory failure.  Using 4 L of oxygen at present Likely postop atelectasis. We will monitor for now. Incentive spirometry.  Parkinsons disease Continue Zoloft and Symmetrel   Benign prostatic hypertrophy, chronic prostatitis  no acute issues Continue Flomax and  proscar   Chronic kidney disease stage 2-3    baseline creatinine 1.1    Current Cr is 1.3-1.4   Diet: per surgery  DVT Prophylaxis: subcutaneous Heparin  Advance goals of care discussion: full code  Family Communication: no family was present at bedside, at the time of interview.   Disposition:  Discharge to snf.  Consultants: cardiology ortho Procedures: PPM implant, intramedullary nail implant  Antibiotics: Anti-infectives (From admission, onward)   Start     Dose/Rate Route Frequency Ordered Stop   11/04/17 1400  ceFAZolin (ANCEF) IVPB 2g/100 mL premix     2 g 200 mL/hr over 30 Minutes Intravenous Every 6 hours 11/04/17 1319 11/04/17 2030   11/03/17 2200  ceFAZolin (ANCEF) IVPB 1 g/50 mL premix     1 g 100 mL/hr over 30 Minutes Intravenous Every 6 hours 11/03/17 1710 11/04/17 1559   11/03/17 1545  gentamicin (GARAMYCIN) 80 mg in sodium chloride 0.9 % 500 mL irrigation     80 mg Irrigation On call 11/03/17 1532 11/03/17 1624   11/03/17 1545  ceFAZolin (ANCEF) IVPB 2g/100 mL premix     2 g 200 mL/hr over 30 Minutes Intravenous On call 11/03/17 1532 11/03/17 1610       Objective: Physical Exam: Vitals:   11/04/17 2018 11/05/17 0022 11/05/17 0420 11/05/17 1148  BP: (!) 112/59 102/71 105/66 116/74  Pulse: (!) 108 (!) 109 96 96  Resp: 20 20 20 18   Temp: 98.6 F (37 C) 98.5 F (36.9 C) 98.2 F (36.8 C) 97.8 F (36.6 C)  TempSrc: Oral Oral Oral Oral  SpO2: 94% 95% 94% 93%  Weight:      Height:        Intake/Output Summary (Last 24 hours) at  11/05/2017 1735 Last data filed at 11/05/2017 1300 Gross per 24 hour  Intake 2148.33 ml  Output 0 ml  Net 2148.33 ml   Filed Weights   11/03/17 1658 11/04/17 0415  Weight: 93.5 kg (206 lb 2.1 oz) 89.8 kg (198 lb)   General: Alert, Awake and Oriented to Person. Appear in mild distress, affect appropriate Eyes: PERRL, Conjunctiva normal ENT: Oral Mucosa clear moist. Neck: no JVD, no Abnormal Mass Or lumps Cardiovascular:  S1 and S2 Present, aortic systolic  Murmur, Peripheral Pulses Present Respiratory: normal respiratory effort, Bilateral Air entry equal and Decreased, no use of accessory muscle, Clear to Auscultation, no Crackles, no wheezes Abdomen: Bowel Sound present, Soft and no tenderness, no hernia Skin: no redness, no Rash, no induration Extremities: no Pedal edema, no calf tenderness Neurologic: Grossly no focal neuro deficit. Bilaterally Equal motor strength  Data Reviewed: CBC: Recent Labs  Lab 11/03/17 0124 11/03/17 0711 11/04/17 0746 11/04/17 1353 11/05/17 0400  WBC 8.1 10.8* 10.5 9.2 13.0*  HGB 14.5 12.9* 11.7* 11.9* 10.0*  HCT 42.0 37.5* 35.9* 36.6* 29.8*  MCV 90.2 91.5 91.8 92.7 91.4  PLT 224 226 196 189 173   Basic Metabolic Panel: Recent Labs  Lab 11/03/17 0124 11/03/17 0711 11/04/17 0746 11/04/17 1353 11/05/17 0400  NA 132* 131* 132*  --  132*  K 3.9 4.3 4.2  --  4.3  CL 101 103 100*  --  105  CO2 25 24 22   --  19*  GLUCOSE 109* 126* 120*  --  128*  BUN 20 21* 22*  --  33*  CREATININE 1.34* 1.44* 1.58* 1.42* 1.69*  CALCIUM 8.5* 8.0* 8.2*  --  7.7*  MG  --   --   --   --  1.9    Liver Function Tests: No results for input(s): AST, ALT, ALKPHOS, BILITOT, PROT, ALBUMIN in the last 168 hours. No results for input(s): LIPASE, AMYLASE in the last 168 hours. No results for input(s): AMMONIA in the last 168 hours. Coagulation Profile: Recent Labs  Lab 11/04/17 0746  INR 1.16   Cardiac Enzymes: Recent Labs  Lab 11/03/17 0124  TROPONINI <0.03   BNP (last 3 results) No results for input(s): PROBNP in the last 8760 hours. CBG: No results for input(s): GLUCAP in the last 168 hours. Studies: No results found.  Scheduled Meds: . amantadine  100 mg Oral BID  . docusate sodium  100 mg Oral BID  . enoxaparin (LOVENOX) injection  40 mg Subcutaneous Q24H  . finasteride  5 mg Oral Daily  . [START ON 11/06/2017] sertraline  50 mg Oral Daily  . sodium chloride flush  3  mL Intravenous Q12H  . tamsulosin  0.4 mg Oral QPC supper   Continuous Infusions: . sodium chloride    . sodium chloride 75 mL/hr at 11/05/17 0750   PRN Meds: sodium chloride, acetaminophen, bisacodyl, HYDROcodone-acetaminophen, menthol-cetylpyridinium **OR** phenol, morphine injection, ondansetron **OR** ondansetron (ZOFRAN) IV, oxyCODONE, polyethylene glycol, senna-docusate, sodium chloride flush  Time spent: 35 minutes  Author: Lynden OxfordPranav Miciah Covelli, MD [facility] Pager: [phone] 11/05/2017 5:35 PM  If 7PM-7AM, please contact night-coverage at [email or web address], password [email or web address]

## 2017-11-06 ENCOUNTER — Encounter (HOSPITAL_COMMUNITY): Payer: Self-pay | Admitting: Internal Medicine

## 2017-11-06 LAB — CBC
HCT: 25.4 % — ABNORMAL LOW (ref 39.0–52.0)
Hemoglobin: 8.4 g/dL — ABNORMAL LOW (ref 13.0–17.0)
MCH: 30.5 pg (ref 26.0–34.0)
MCHC: 33.1 g/dL (ref 30.0–36.0)
MCV: 92.4 fL (ref 78.0–100.0)
PLATELETS: 182 10*3/uL (ref 150–400)
RBC: 2.75 MIL/uL — AB (ref 4.22–5.81)
RDW: 15.8 % — ABNORMAL HIGH (ref 11.5–15.5)
WBC: 10.2 10*3/uL (ref 4.0–10.5)

## 2017-11-06 LAB — BASIC METABOLIC PANEL
Anion gap: 7 (ref 5–15)
BUN: 35 mg/dL — ABNORMAL HIGH (ref 6–20)
CALCIUM: 7.8 mg/dL — AB (ref 8.9–10.3)
CHLORIDE: 105 mmol/L (ref 101–111)
CO2: 23 mmol/L (ref 22–32)
CREATININE: 1.51 mg/dL — AB (ref 0.61–1.24)
GFR, EST AFRICAN AMERICAN: 44 mL/min — AB (ref >60)
GFR, EST NON AFRICAN AMERICAN: 38 mL/min — AB (ref >60)
Glucose, Bld: 102 mg/dL — ABNORMAL HIGH (ref 65–99)
Potassium: 4 mmol/L (ref 3.5–5.1)
SODIUM: 135 mmol/L (ref 135–145)

## 2017-11-06 MED ORDER — POLYETHYLENE GLYCOL 3350 17 G PO PACK
17.0000 g | PACK | Freq: Every day | ORAL | Status: DC
  Administered 2017-11-06 – 2017-11-07 (×2): 17 g via ORAL
  Filled 2017-11-06 (×2): qty 1

## 2017-11-06 MED ORDER — SODIUM CHLORIDE 0.9 % IV SOLN
1.0000 g | Freq: Once | INTRAVENOUS | Status: AC
  Administered 2017-11-06: 1 g via INTRAVENOUS
  Filled 2017-11-06 (×2): qty 10

## 2017-11-06 MED ORDER — SENNOSIDES-DOCUSATE SODIUM 8.6-50 MG PO TABS
1.0000 | ORAL_TABLET | Freq: Two times a day (BID) | ORAL | Status: DC
  Administered 2017-11-06 – 2017-11-07 (×2): 1 via ORAL
  Filled 2017-11-06 (×2): qty 1

## 2017-11-06 NOTE — Progress Notes (Addendum)
[facility] Progress Note  Patient: Justin Hendrix JYN:[id number]   PCP: Jaclyn Shaggy, MD DOB: 1922-12-10   DOA: 11/03/2017   DOS: 11/06/2017   Date of Service: the patient was seen and examined on 11/06/2017  Subjective: more awake, no acute complains, no fever chills.  Brief hospital course: Pt. with PMH of  BPH, Parkinson's disease, prior history of TIA, CKD, hypertension, chronic prostatitis; admitted on 11/03/2017, presented with complaint of fall, was found to have heart block as well as right hip fracture.  S/P permanent pacemaker implant as well as intramedullary nailing placement for right hip fracture. Currently further plan is monitor postop recovery.  Assessment and Plan: Syncope secondary to complete heart block. S/P permanent pacemaker implant. Management per cardiology. outpatient follow up.  Appreciate their input and assistance.  Closed Comminuted displaced intertrochanteric right proximal femur fracture.   S/P intramedullary nail implant. Pain appears to be well controlled. Continue pain control. Orthopedic sports medicine Dr. Everardo Pacific has been consulted. Patient S/P intramedullary nail implant. Tolerated very well. PT OT recommends SNF, family want to take pt home with home health.   Cardiology recommended patient would be moderate to high risk for significant cardiovascular outcome perioperatively although recommend no further workup at present given that it will not change the management options. Echocardiogram shows preserved EF without any significant valvular abnormality.   Unwitnessed fall. Patient's son at bedside are not sure whether patient has has had are not. Patient poor historian due to dementia. Unremarkable CT head for any acute trauma.  Acute hypoxic respiratory failure. Likely postop atelectasis. We will monitor for now. Incentive spirometry.  Post-op blood loss anemia  Hb has trended down  Will monitor, no acute bleeding seen  elsewhere, likely a component of dilution 14.5 12.9 (L) 11.7 (L) 11.9 (L) 10.0 (L) 8.4 (L)   Parkinsons disease Continue Zoloft and Symmetrel   Benign prostatic hypertrophy, chronic prostatitis  no acute issues Continue Flomax and proscar   AKI on Chronic kidney disease stage 3 baseline creatinine 1.1 Creatinine increased to 1.69, BUN was also elevated Was given IVF, will stop the fluids for now.   Diet: cardiac diet DVT Prophylaxis: subcutaneous Heparin  Advance goals of care discussion: full code  Family Communication: no family was present at bedside, at the time of interview.   Disposition:  Discharge to home with home health likely tomorrow if creatinine stable.  Consultants: cardiology ortho Procedures: PPM implant, intramedullary nail implant  Antibiotics: Anti-infectives (From admission, onward)   Start     Dose/Rate Route Frequency Ordered Stop   11/04/17 1400  ceFAZolin (ANCEF) IVPB 2g/100 mL premix     2 g 200 mL/hr over 30 Minutes Intravenous Every 6 hours 11/04/17 1319 11/04/17 2030   11/03/17 2200  ceFAZolin (ANCEF) IVPB 1 g/50 mL premix     1 g 100 mL/hr over 30 Minutes Intravenous Every 6 hours 11/03/17 1710 11/04/17 1559   11/03/17 1545  gentamicin (GARAMYCIN) 80 mg in sodium chloride 0.9 % 500 mL irrigation     80 mg Irrigation On call 11/03/17 1532 11/03/17 1624   11/03/17 1545  ceFAZolin (ANCEF) IVPB 2g/100 mL premix     2 g 200 mL/hr over 30 Minutes Intravenous On call 11/03/17 1532 11/03/17 1610       Objective: Physical Exam: Vitals:   11/05/17 1148 11/05/17 2110 11/06/17 0443 11/06/17 1140  BP: 116/74 (!) 130/95 132/86 (!) 143/86  Pulse: 96 94 84 (!) 101  Resp: 18 18 18  18  Temp: 97.8 F (36.6 C) 97.8 F (36.6 C) 98.2 F (36.8 C) 98.7 F (37.1 C)  TempSrc: Oral Oral Oral Oral  SpO2: 93% 96% 95% 96%  Weight:      Height:        Intake/Output Summary (Last 24 hours) at 11/06/2017 1421 Last data filed at 11/06/2017 0943 Gross per 24  hour  Intake 2239.17 ml  Output -  Net 2239.17 ml   Filed Weights   11/03/17 1658 11/04/17 0415  Weight: 93.5 kg (206 lb 2.1 oz) 89.8 kg (198 lb)   General: Alert, Awake and Oriented to Person. Appear in mild distress, affect appropriate Eyes: PERRL, Conjunctiva normal ENT: Oral Mucosa clear moist. Neck: no JVD, no Abnormal Mass Or lumps Cardiovascular: S1 and S2 Present, aortic systolic  Murmur, Peripheral Pulses Present Respiratory: normal respiratory effort, Bilateral Air entry equal and Decreased, no use of accessory muscle, Clear to Auscultation, no Crackles, no wheezes Abdomen: Bowel Sound present, Soft and no tenderness, no hernia Skin: no redness, no Rash, no induration Extremities: no Pedal edema, no calf tenderness Neurologic: Grossly no focal neuro deficit. Bilaterally Equal motor strength  Data Reviewed: CBC: Recent Labs  Lab 11/03/17 0711 11/04/17 0746 11/04/17 1353 11/05/17 0400 11/06/17 0632  WBC 10.8* 10.5 9.2 13.0* 10.2  HGB 12.9* 11.7* 11.9* 10.0* 8.4*  HCT 37.5* 35.9* 36.6* 29.8* 25.4*  MCV 91.5 91.8 92.7 91.4 92.4  PLT 226 196 189 173 182   Basic Metabolic Panel: Recent Labs  Lab 11/03/17 0124 11/03/17 0711 11/04/17 0746 11/04/17 1353 11/05/17 0400 11/06/17 0632  NA 132* 131* 132*  --  132* 135  K 3.9 4.3 4.2  --  4.3 4.0  CL 101 103 100*  --  105 105  CO2 25 24 22   --  19* 23  GLUCOSE 109* 126* 120*  --  128* 102*  BUN 20 21* 22*  --  33* 35*  CREATININE 1.34* 1.44* 1.58* 1.42* 1.69* 1.51*  CALCIUM 8.5* 8.0* 8.2*  --  7.7* 7.8*  MG  --   --   --   --  1.9  --     Liver Function Tests: No results for input(s): AST, ALT, ALKPHOS, BILITOT, PROT, ALBUMIN in the last 168 hours. No results for input(s): LIPASE, AMYLASE in the last 168 hours. No results for input(s): AMMONIA in the last 168 hours. Coagulation Profile: Recent Labs  Lab 11/04/17 0746  INR 1.16   Cardiac Enzymes: Recent Labs  Lab 11/03/17 0124  TROPONINI <0.03   BNP  (last 3 results) No results for input(s): PROBNP in the last 8760 hours. CBG: No results for input(s): GLUCAP in the last 168 hours. Studies: No results found.  Scheduled Meds: . amantadine  100 mg Oral BID  . docusate sodium  100 mg Oral BID  . enoxaparin (LOVENOX) injection  40 mg Subcutaneous Q24H  . finasteride  5 mg Oral Daily  . polyethylene glycol  17 g Oral Daily  . senna-docusate  1 tablet Oral BID  . sertraline  50 mg Oral Daily  . sodium chloride flush  3 mL Intravenous Q12H  . tamsulosin  0.4 mg Oral QPC supper   Continuous Infusions: . sodium chloride    . calcium gluconate     PRN Meds: sodium chloride, acetaminophen, bisacodyl, HYDROcodone-acetaminophen, menthol-cetylpyridinium **OR** phenol, morphine injection, ondansetron **OR** ondansetron (ZOFRAN) IV, oxyCODONE, sodium chloride flush  Time spent: 35 minutes  Author: Lynden OxfordPranav Patel, MD [facility] Pager: [phone] 11/06/2017 2:21 PM  If 7PM-7AM, please contact night-coverage at [email or web address], password [facility]

## 2017-11-06 NOTE — Plan of Care (Signed)
  Problem: Elimination: Goal: Will not experience complications related to bowel motility 11/06/2017 2030 by Carolanne GrumblingWall, Anatalia Kronk C, RN Outcome: Not Progressing Pt. Unable to have BM. Meds scheduled. RN will administer and reassess for PRN meds as needed.  11/06/2017 2030 by Carolanne GrumblingWall, Blanchie Zeleznik C, RN Reactivated   Problem: Elimination: Goal: Will not experience complications related to urinary retention Reactivated

## 2017-11-06 NOTE — Care Management Note (Signed)
Case Management Note  Patient Details  Name: Candis MusaCameron W Michaelson MRN: [id number] Date of Birth: 01/25/1923  Subjective/Objective:     CHB              Action/Plan: Received call from Gustavo LahSarah SW stating that patient plans to return home at discharge. TCT Molly MaduroRobert POA [phone]) for HHC choices, Molly MaduroRobert chose [facility]; [facility]. DME - walker and cane at home.  Expected Discharge Date:  11/06/17               [facility] Plan[facility] Referral:  Clinical Social Work  Discharge planning Services  CM Consult [facility]  [facility]  HH Arranged:  PT, OT, Nurse's Aide HH Agency:  [facility]  Status of Service:  In process, will continue to follow  Reola MosherChandler, Galo Sayed L, RN,MHA,BSN [phone] 11/06/2017, 1:58 PM

## 2017-11-06 NOTE — Progress Notes (Signed)
ORTHOPAEDIC PROGRESS NOTE  s/p Procedure(s): INTRAMEDULLARY (IM) NAIL INTERTROCHANTRIC  SUBJECTIVE: No complaints.  OBJECTIVE: PE:  RLE: incision CDI, leg lengths equal, warm well perfused foot, intact EHL/TA/GSC   Vitals:   11/06/17 0443 11/06/17 1140  BP: 132/86 (!) 143/86  Pulse: 84 (!) 101  Resp: 18 18  Temp: 98.2 F (36.8 C) 98.7 F (37.1 C)  SpO2: 95% 96%     ASSESSMENT: Justin Hendrix is a 84 y.o. male doing well postoperatively.  PLAN: Weightbearing: WBAT RLE Insicional and dressing care: OK to remove dressings 3 and leave open to air with dry gauze PRN Orthopedic device(s): None Showering: prn, dressings water resistant VTE prophylaxis: Lovenox 40mg  qd x6 weeks Pain control: prn meds, minimize narcs Follow - up plan: 2 weeks, ok to dc per primary to snf Contact information:  Weekdays 8-5 Ramond Marrowax Whitleigh Garramone MD [phone], After hours and holidays please check [email or web address] for group call information for [facility]

## 2017-11-06 NOTE — Clinical Social Work Note (Signed)
CSW spoke with patient's two sons. Justin MaduroRobert (according to previous notes) is HCPOA. Discussed SNF placement. He prefers home health and states he is always with him at home. He has worked with home health in the past. RNCM notified. Patient's son's phone number is [phone].  CSW will continue to follow in case plans change.  [name], CSW [phone]

## 2017-11-07 LAB — CBC
HCT: 26.5 % — ABNORMAL LOW (ref 39.0–52.0)
HEMOGLOBIN: 8.7 g/dL — AB (ref 13.0–17.0)
MCH: 30.1 pg (ref 26.0–34.0)
MCHC: 32.8 g/dL (ref 30.0–36.0)
MCV: 91.7 fL (ref 78.0–100.0)
Platelets: 241 10*3/uL (ref 150–400)
RBC: 2.89 MIL/uL — AB (ref 4.22–5.81)
RDW: 15.2 % (ref 11.5–15.5)
WBC: 10.3 10*3/uL (ref 4.0–10.5)

## 2017-11-07 LAB — BASIC METABOLIC PANEL
ANION GAP: 7 (ref 5–15)
BUN: 30 mg/dL — ABNORMAL HIGH (ref 6–20)
CALCIUM: 8.2 mg/dL — AB (ref 8.9–10.3)
CO2: 23 mmol/L (ref 22–32)
CREATININE: 1.29 mg/dL — AB (ref 0.61–1.24)
Chloride: 106 mmol/L (ref 101–111)
GFR, EST AFRICAN AMERICAN: 53 mL/min — AB (ref >60)
GFR, EST NON AFRICAN AMERICAN: 46 mL/min — AB (ref >60)
Glucose, Bld: 92 mg/dL (ref 65–99)
Potassium: 3.8 mmol/L (ref 3.5–5.1)
SODIUM: 136 mmol/L (ref 135–145)

## 2017-11-07 LAB — GLUCOSE, CAPILLARY: GLUCOSE-CAPILLARY: 122 mg/dL — AB (ref 65–99)

## 2017-11-07 MED ORDER — SENNOSIDES-DOCUSATE SODIUM 8.6-50 MG PO TABS: 1.0000 | ORAL_TABLET | Freq: Every day | ORAL | 0 refills | Status: DC

## 2017-11-07 MED ORDER — POLYETHYLENE GLYCOL 3350 17 G PO PACK: 17.0000 g | PACK | Freq: Every day | ORAL | 0 refills | Status: DC

## 2017-11-07 NOTE — Clinical Social Work Note (Signed)
CSW facilitated patient discharge including contacting patient family and facility to confirm patient discharge plans. Clinical information faxed to facility and family agreeable with plan. CSW arranged ambulance transport via [facility] to [facility]. RN to call report prior to discharge [phone]).  CSW will sign off for now as social work intervention is no longer needed. Please consult us again if new needs arise.  Charlynn CourtSarah Kaniya Trueheart, CSW [phone]

## 2017-11-07 NOTE — Progress Notes (Signed)
Physical Therapy Treatment Patient Details Name: FREEMON BINFORD MRN: [id number] DOB: [birth year] Today's Date: 11/07/2017    History of Present Illness 84 yo admitted s/p fall with right femur fx s/p IM nail. Pt with pacemaker placed this admission. PMHx: parkinson's, dementia, BPH    PT Comments    Pt pleasant, alert and unaware of place, time or situation. Pt able to follow single step commands with increased time with increased time limiting pivot and further mobility. Pt able to tolerate standing and pivot to chair with 2 person assist with cues and assist to not push with LUE. Per MD family wanting pt to return home but given need for 2 person assist, inability to maintain standing for significant period of time and LUE precautions continue to recommend SNF at this time.     Follow Up Recommendations  SNF;Supervision/Assistance - 24 hour     Equipment Recommendations  Rolling walker with 5" wheels;Wheelchair (measurements PT);Wheelchair cushion (measurements PT);Other (comment)    Recommendations for Other Services       Precautions / Restrictions Precautions Precautions: Fall;ICD/Pacemaker Precaution Comments: pt does not have posterior precautions Restrictions RLE Weight Bearing: Weight bearing as tolerated    Mobility  Bed Mobility Overal bed mobility: Needs Assistance Bed Mobility: Supine to Sit     Supine to sit: Mod assist;HOB elevated     General bed mobility comments: mod assist to move bil LE to EOB with cues for use of RUE on rail and assist with pad to pivot fully to EOB, increased time  Transfers Overall transfer level: Needs assistance   Transfers: Sit to/from Stand;Stand Pivot Transfers Sit to Stand: Mod assist;+2 physical assistance Stand pivot transfers: Mod assist;+2 safety/equipment;+2 physical assistance       General transfer comment: mod assist with bil knees blocked, RUE assist and cues to not push with LUE, assist from belt. Mod assist to  rise and control pelvis to pivot, cues and assist to step feet to chair  Ambulation/Gait             General Gait Details: unable   Stairs             Wheelchair Mobility    Modified Rankin (Stroke Patients Only)       Balance Overall balance assessment: Needs assistance   Sitting balance-Leahy Scale: Fair       Standing balance-Leahy Scale: Poor                              Cognition Arousal/Alertness: Awake/alert Behavior During Therapy: Flat affect Overall Cognitive Status: Impaired/Different from baseline Area of Impairment: Orientation;Attention;Memory;Following commands;Safety/judgement                 Orientation Level: Disoriented to;Time;Situation;Place     Following Commands: Follows one step commands inconsistently;Follows one step commands with increased time Safety/Judgement: Decreased awareness of safety;Decreased awareness of deficits            Exercises General Exercises - Lower Extremity Long Arc Quad: AROM;AAROM;10 reps;Seated;Left;Right(AAROM RLE) Hip ABduction/ADduction: AAROM;10 reps;Seated;Both Hip Flexion/Marching: AAROM;AROM;10 reps;Seated;Right;Left(AAROM RLE)    General Comments        Pertinent Vitals/Pain Pain Assessment: No/denies pain    Home Living                      Prior Function            PT Goals (current goals can now be found  in the care plan section) Progress towards PT goals: Progressing toward goals    Frequency    Min 3X/week      PT Plan Current plan remains appropriate    Co-evaluation              AM-PAC PT "6 Clicks" Daily Activity  Outcome Measure  Difficulty turning over in bed (including adjusting bedclothes, sheets and blankets)?: Unable Difficulty moving from lying on back to sitting on the side of the bed? : Unable Difficulty sitting down on and standing up from a chair with arms (e.g., wheelchair, bedside commode, etc,.)?: Unable Help  needed moving to and from a bed to chair (including a wheelchair)?: A Lot Help needed walking in hospital room?: Total Help needed climbing 3-5 steps with a railing? : Total 6 Click Score: 7    End of Session Equipment Utilized During Treatment: Gait belt Activity Tolerance: Patient tolerated treatment well Patient left: in chair;with call bell/phone within reach;with chair alarm set Nurse Communication: Mobility status;Precautions;Weight bearing status PT Visit Diagnosis: History of falling (Z91.81);Difficulty in walking, not elsewhere classified (R26.2);Other abnormalities of gait and mobility (R26.89)     Time: [id number] PT Time Calculation (min) (ACUTE ONLY): 24 min  Charges:  $Therapeutic Exercise: 8-22 mins $Therapeutic Activity: 8-22 mins                    G Codes:       Delaney MeigsMaija Tabor Corderro Koloski, PT [phone]    Taksh Hjort B Landrey Mahurin 11/07/2017, 11:01 AM

## 2017-11-07 NOTE — Discharge Instructions (Signed)
° ° °  Supplemental Discharge Instructions for  Pacemaker/Defibrillator Patients  Activity No heavy lifting or vigorous activity with your left/right arm for 6 to 8 weeks.  Do not raise your left/right arm above your head for one week.  Gradually raise your affected arm as drawn below.             11/07/17                     11/08/17                    11/09/17                   11/10/17 __  NO DRIVING patient does not drive.  WOUND CARE - Keep the wound area clean and dry.  Do not get this area wet, no showers until cleared to at your wound care visit. - The tape/steri-strips on your wound will fall off; do not pull them off.  No bandage is needed on the site.  DO  NOT apply any creams, oils, or ointments to the wound area. - If you notice any drainage or discharge from the wound, any swelling or bruising at the site, or you develop a fever > 101? F after you are discharged home, call the office at once.  Special Instructions - You are still able to use cellular telephones; use the ear opposite the side where you have your pacemaker/defibrillator.  Avoid carrying your cellular phone near your device. - When traveling through airports, show security personnel your identification card to avoid being screened in the metal detectors.  Ask the security personnel to use the hand wand. - Avoid arc welding equipment, MRI testing (magnetic resonance imaging), TENS units (transcutaneous nerve stimulators).  Call the office for questions about other devices. - Avoid electrical appliances that are in poor condition or are not properly grounded. - Microwave ovens are safe to be near or to operate.  Additional information for defibrillator patients should your device go off: - If your device goes off ONCE and you feel fine afterward, notify the device clinic nurses. - If your device goes off ONCE and you do not feel well afterward, call 911. - If your device goes off TWICE, call 911. - If your device goes  off THREE times in one day, call 911.  DO NOT DRIVE YOURSELF OR A FAMILY MEMBER WITH A DEFIBRILLATOR TO THE HOSPITAL--CALL 911.

## 2017-11-07 NOTE — Progress Notes (Deleted)
NA

## 2017-11-07 NOTE — Care Management Important Message (Signed)
Important Message  Patient Details  Name: Justin Hendrix MRN: [id number] Date of Birth: 02/25/1923   Medicare Important Message Given:  Yes    Breyah Akhter P Arion Morgan 11/07/2017, 2:10 PM

## 2017-11-07 NOTE — Discharge Summary (Signed)
[facility] Discharge Summary   Patient: Justin Hendrix:[id number]   PCP: Jaclyn Shaggy, MD DOB: 10/20/1922   Date of admission: 11/03/2017   Date of discharge:  11/07/2017    Discharge Diagnoses:  Principal Problem:   Complete heart block (HCC) Active Problems:   Chronic fatigue   Parkinson's disease (HCC)   Chronic prostatitis   Incomplete emptying of bladder   History of TIA (transient ischemic attack)   S/P right hip fracture   Admitted From: home Disposition:  SNF  Recommendations for Outpatient Follow-up:  1. Please follow-up with PCP in 1 week, orthopedic as recommended, cardiology as recommended.   Contact information for follow-up providers    [facility] [address] [facility] Follow up on 11/15/2017.   Specialty:  Cardiology Why:  2:00PM, wound check visit Contact information: [address] [place] [address] [phone]       Duke Salvia, MD Follow up on 02/06/2018.   Specialty:  Cardiology Why:  11:00AM Contact information: [address] [place] [address] [phone]        [facility] - Dme Follow up.   Why:  They will do your home health care at your home Contact information: [address] [place] [address] [phone]        Jaclyn Shaggy, MD. Schedule an appointment as soon as possible for a visit in 1 week(s).   Specialty:  Internal Medicine Contact information: [address]   [place] [address] [phone]            Contact information for after-discharge care    Destination    [facility] .   Service:  Skilled Nursing Contact information: [address] [place] [address] [phone]                 Diet recommendation: Cardiac diet  Activity: The patient is advised to gradually reintroduce usual activities.  Discharge Condition: good  Code Status: DNR DNI  History of  present illness: As per the H and P dictated on admission, "Justin Hendrix is a 84 y.o. male with medical history significant for BPH, Parkinson's disease, prior history of TIA, CKD, hypertension, chronic prostatitis, initially brought to the emergency room at [facility], with severe right hip pain, status post a mechanical fall, with subsequent comminuted displaced intratrochanteric right proximal femoral fracture.  History is obtained by chart, as the patient at this moment is unable to provide further history, level 5 caveat, due to recent sedation at the Cath Lab.  Also team was consulted, the patient was to undergo surgery, however he was found to be bradycardic, with heart rate in the 30s.  He was found to have complete heart block, cardiology evaluation was obtained, and the patient was transferred to [facility], for pacemaker placement, Dr. Johney Frame.  The patient tolerated the procedure well, without complications, and his heart rate is now 94.  Per chart, it appears that the patient did have increased weakness, and dizziness.  He does not recall losing consciousness.  He was otherwise active until recently, ambulated with a walker or cane.  No apparent confusion"  Hospital Course:  Summary of his active problems in the hospital is as following. Syncope secondary to complete heart block. S/P permanent pacemaker implant. Management per cardiology. outpatient follow up.  Appreciate their input and assistance.  Closed Comminuted displaced intertrochanteric right proximal femur fracture.  S/P intramedullary nail implant. Pain appears to be well controlled. Continue pain control. Orthopedic sports medicine Dr. Everardo Pacific has been consulted. Patient S/P intramedullary nail implant. Tolerated very well. PT OT recommends SNF, initially family wanted to take the patient at home, discussed with son as well as patient both now agreeable to SNF since the patient requires to assist along with has  limitation with left arm due to pacemaker implant.  Preop clearance. Cardiology recommended patient would be moderate to high risk for significant cardiovascular outcome perioperatively although recommend no further workup at present given that it will not change the management options. Echocardiogram shows preserved EF without any significant valvular abnormality.  Unwitnessed fall. Patient's son at bedside are not sure whether patient has has had are not. Patient poor historian due to dementia. Unremarkable CT head for any acute trauma.  Acute hypoxic respiratory failure.  Resolved. Likely postop atelectasis. We will monitor for now. Incentive spirometry.  Post-op blood loss anemia  Hb has trended down  Will monitor, no acute bleeding seen elsewhere, likely a component of dilution, now hemoglobin stable for last 48 hours again no active bleeding noted. 14.5 12.9 (L) 11.7 (L) 11.9 (L) 10.0 (L) 8.4 (L) 8.7 (L)   Parkinsons disease Continue Zoloft and Symmetrel  Benign prostatic hypertrophy,chronic prostatitisno acute issues ContinueFlomax and proscar  AKI on Chronic kidney disease stage3 baseline creatinine 1.1 Creatinine increased to 1.69, BUN was also elevated, now renal function back to baseline with IV fluid.  Encourage p.o. fluid.   All other chronic medical condition were stable during the hospitalization.  Patient was seen by physical therapy, who recommended SNF, which was arranged by Child psychotherapist and case Production designer, theatre/television/film. On the day of the discharge the patient's vitals were stable , and no other acute medical condition were reported by patient. the patient was felt safe to be discharge at SNF with therapy.  Procedures and Results:  Echocardiogram   Permanent pacemaker implant.  Intramedullary nail implant right  Consultations:  General cardiology  Electrophysiology  Orthopedics, sports medicine Dr. Everardo Pacific  DISCHARGE MEDICATION: Allergies as of  11/07/2017   No Known Allergies     Medication List    STOP taking these medications   ceFAZolin 2-4 GM/100ML-% IVPB Commonly known as:  ANCEF     TAKE these medications   acetaminophen 500 MG tablet Commonly known as:  TYLENOL Take 2 tablets (1,000 mg total) by mouth every 8 (eight) hours for 14 days. What changed:    medication strength  how much to take  when to take this  reasons to take this   amantadine 100 MG capsule Commonly known as:  SYMMETREL Take 100 mg by mouth 2 (two) times daily.   aspirin 81 MG tablet Take 81 mg by mouth daily.   budesonide-formoterol 160-4.5 MCG/ACT inhaler Commonly known as:  SYMBICORT Inhale 2 puffs into the lungs 2 (two) times daily.   docusate sodium 100 MG capsule Commonly known as:  COLACE Take 1 capsule (100 mg total) by mouth 2 (two) times daily.   enoxaparin 40 MG/0.4ML injection Commonly known as:  LOVENOX Inject 0.4 mLs (40 mg total) into the skin daily.   finasteride 5 MG tablet Commonly known as:  PROSCAR Take 5 mg by mouth daily.   fluticasone 50 MCG/ACT nasal spray Commonly known as:  FLONASE 2 sprays by Each Nare route daily. as needed   ondansetron 4 MG tablet Commonly known as:  ZOFRAN Take 1 tablet (4 mg total) by mouth every  6 (six) hours as needed for nausea.   oxyCODONE 5 MG immediate release tablet Commonly known as:  Oxy IR/ROXICODONE Take 1-2 pills every 6 hrs as needed for pain   pantoprazole 40 MG tablet Commonly known as:  PROTONIX Take 40 mg by mouth daily.   polyethylene glycol packet Commonly known as:  MIRALAX / GLYCOLAX Take 17 g by mouth daily. Start taking on:  11/08/2017   senna-docusate 8.6-50 MG tablet Commonly known as:  Senokot-S Take 1 tablet by mouth at bedtime.   sertraline 100 MG tablet Commonly known as:  ZOLOFT Take 100 mg by mouth daily.   tamsulosin 0.4 MG Caps capsule Commonly known as:  FLOMAX Take 0.4 mg by mouth daily.      No Known  Allergies Discharge Instructions    Diet - low sodium heart healthy   Complete by:  As directed    Discharge instructions   Complete by:  As directed    Ramond Marrow MD, MPH [facility] [address] [phone] (tel)   [phone] (fax)   POST-OPERATIVE INSTRUCTIONS   WOUND CARE  You may shower on Post-Op Day #3. Dressing can be removed and ok to shower and dry dressing can be placed as you see fit if there is any drainage.  EXERCISES Follow the instructions of your therapist.  No specific exercises necessary outside of this.  POST-OP MEDICINES A multi-modal approach will be used to treat your pain. Oxycodone - This is a strong narcotic, to be used only on an "as needed" basis for pain. Acetaminophen 500mg - A non-narcotic pain medicine.  Use 1000mg  three times a day for the first 14 days after surgery   Zofran 4mg  - This is an anti-nausea medicine to be used only if you are having nausea or vomitting.  FOLLOW-UP If you develop a Fever (>101.5), Redness or Drainage from the surgical incision site, please call our office to arrange for an evaluation. Please call the office to schedule a follow-up appointment for your suture removal, 10-14 days post-operatively.  IF YOU HAVE ANY QUESTIONS, PLEASE FEEL FREE TO CALL OUR OFFICE.  HELPFUL INFORMATION You should wean off your narcotic medicines as soon as you are able.  Most patients will be off or using minimal narcotics before their first postop appointment.   We suggest you use the pain medication the first night prior to going to bed, in order to ease any pain when the anesthesia wears off. You should avoid taking pain medications on an empty stomach as it will make you nauseous.  Do not drink alcoholic beverages or take illicit drugs when taking pain medications.  Pain medication may make you constipated.  Below are a few solutions to try in this order: Decrease the amount of pain medication  if you aren't having pain. Drink lots of decaffeinated fluids. Drink prune juice and/or each dried prunes  If the first 3 don't work start with additional solutions Take Colace - an over-the-counter stool softener Take Senokot - an over-the-counter laxative Take Miralax - a stronger over-the-counter laxative   Discharge instructions   Complete by:  As directed    It is important that you read following instructions as well as go over your medication list with RN to help you understand your care after this hospitalization.  Discharge Instructions: Please follow-up with PCP in one week  Please request your primary care physician to go over all Hospital Tests and Procedure/Radiological results at the follow up,  Please get all Hospital records sent to your PCP by signing hospital release before you go home.   Do not take more than prescribed Pain, Sleep and Anxiety Medications. You were cared for by a hospitalist during your hospital stay. If you have any questions about your discharge medications or the care you received while you were in the hospital after you are discharged, you can call the unit and ask to speak with the hospitalist on call if the hospitalist that took care of you is not available.  Once you are discharged, your primary care physician will handle any further medical issues. Please note that NO REFILLS for any discharge medications will be authorized once you are discharged, as it is imperative that you return to your primary care physician (or establish a relationship with a primary care physician if you do not have one) for your aftercare needs so that they can reassess your need for medications and monitor your lab values. You Must read complete instructions/literature along with all the possible adverse reactions/side effects for all the Medicines you take and that have been prescribed to you. Take any new Medicines after you have completely understood and accept all the  possible adverse reactions/side effects. Wear Seat belts while driving. If you have smoked or chewed Tobacco in the last 2 yrs please stop smoking and/or stop any Recreational drug use.   Increase activity slowly   Complete by:  As directed      Discharge Exam: Filed Weights   11/04/17 0415  Weight: 89.8 kg (198 lb)   Vitals:   11/07/17 0338 11/07/17 1055  BP: (!) 142/71   Pulse: 66   Resp: 18   Temp: 98 F (36.7 C)   SpO2: 98% 98%   General: Appear in no distress, no Rash; Oral Mucosa moist Cardiovascular: S1 and S2 Present, no Murmur, no JVD Respiratory: Bilateral Air entry present and Clear to Auscultation, no Crackles, no wheezes Abdomen: Bowel Sound present, Soft and no tenderness Extremities: no Pedal edema, no calf tenderness Neurology: Grossly no focal neuro deficit.  The results of significant diagnostics from this hospitalization (including imaging, microbiology, ancillary and laboratory) are listed below for reference.    Significant Diagnostic Studies: Ct Head Wo Contrast  Result Date: 11/03/2017 CLINICAL DATA:  84 y/o  M; ataxia and head trauma. EXAM: CT HEAD WITHOUT CONTRAST TECHNIQUE: Contiguous axial images were obtained from the base of the skull through the vertex without intravenous contrast. COMPARISON:  None. FINDINGS: Brain: No evidence of acute infarction, hemorrhage, hydrocephalus, extra-axial collection or mass lesion/mass effect. Small chronic lacunar infarcts are present within the left thalamus and right caudate head. Moderate chronic microvascular ischemic changes and parenchymal volume loss of the brain. Vascular: Calcific atherosclerosis of the carotid siphons. No hyperdense vessel identified. Skull: Normal. Negative for fracture or focal lesion. Sinuses/Orbits: Partial opacification of right mastoid air cells. Bilateral intra-ocular lens replacement. Otherwise negative. Other: Multiple plaque-like dermal lesions are present scattered diffusely  throughout the scalp in greatest concentration in the bilateral temporal fossa. IMPRESSION: 1. No acute intracranial abnormality or calvarial fracture. 2. Chronic lacunar infarct within left thalamus and right caudate head. 3. Moderate chronic microvascular ischemic changes and scratch parenchymal volume loss of the brain. 4. Multiple plaque-like dermal lesions are present diffusely over the scalp in greatest concentration in bilateral temporal fossa. Direct visualization recommended. Electronically Signed   By: Mitzi Hansen M.D.   On: 11/03/2017 21:39   Dg Chest Port 1 View  Result Date:  11/04/2017 CLINICAL DATA:  Pre-op for right femoral nail this AM; hx of pacemaker insertion and TIA; former smoker EXAM: PORTABLE CHEST 1 VIEW COMPARISON:  None. FINDINGS: Mild cardiomegaly. Aortic atherosclerosis. LEFT chest wall pacemaker/ICD apparatus in place. Vague opacity at the LEFT lung base, overlying the LEFT heart border, possibly atelectasis. Lungs otherwise clear. IMPRESSION: 1. Opacity at the LEFT lung base, overlying the LEFT heart border, possibly atelectasis. Pneumonia or aspiration could not be excluded if febrile. Lungs otherwise clear. 2. Mild cardiomegaly. 3. Aortic atherosclerosis. Electronically Signed   By: Bary RichardStan  Maynard M.D.   On: 11/04/2017 07:58   Dg Chest Port 1 View  Result Date: 11/03/2017 CLINICAL DATA:  Fall.  Preop. EXAM: PORTABLE CHEST 1 VIEW COMPARISON:  Radiograph 09/15/2014 FINDINGS: Stable cardiomegaly and mediastinal contours. Aortic atherosclerosis. Probable retrocardiac hiatal hernia. No consolidation, pleural effusion or pneumothorax. No acute osseous abnormalities are seen. IMPRESSION: 1. No acute abnormality. 2. Stable cardiomegaly with aortic atherosclerosis. 3. Probable retrocardiac hiatal hernia. Electronically Signed   By: Rubye OaksMelanie  Ehinger M.D.   On: 11/03/2017 02:40   Dg C-arm 1-60 Min  Result Date: 11/04/2017 CLINICAL DATA:  Right femoral intramedullary nail  placement. EXAM: RIGHT FEMUR 2 VIEWS; DG C-ARM 61-120 MIN COMPARISON:  None. FINDINGS: Intraoperative x-rays demonstrate interval cephalomedullary rod placement for comminuted intertrochanteric right femur fracture. The dominant fragments are now in near anatomic alignment. There is persistent mild medial displacement of the lesser trochanteric fragment. No hardware complication. IMPRESSION: Comminuted intertrochanteric right femur fracture status post ORIF with near anatomic alignment. Persistent mild medial displacement of the lesser trochanteric fragment. FLUOROSCOPY TIME:  2 minutes, 14 seconds. C-arm fluoroscopic images were obtained intraoperatively and submitted for post operative interpretation. Electronically Signed   By: Obie DredgeWilliam T Derry M.D.   On: 11/04/2017 11:07   Dg Hip Unilat W Or Wo Pelvis 2-3 Views Right  Result Date: 11/03/2017 CLINICAL DATA:  Fall while walking dog.  Right hip pain. EXAM: DG HIP (WITH OR WITHOUT PELVIS) 2-3V RIGHT COMPARISON:  None. FINDINGS: Comminuted displaced intertrochanteric right hip fracture. There is mild apex lateral angulation of dominant fracture fragments. Displacement involves the lesser trochanter. Femoral head remains located. Pubic rami are intact. Pubic symphysis and sacroiliac joints are congruent. IMPRESSION: Comminuted displaced intertrochanteric right proximal femur fracture. Electronically Signed   By: Rubye OaksMelanie  Ehinger M.D.   On: 11/03/2017 02:41   Dg Femur, Min 2 Views Right  Result Date: 11/04/2017 CLINICAL DATA:  Right femoral intramedullary nail placement. EXAM: RIGHT FEMUR 2 VIEWS; DG C-ARM 61-120 MIN COMPARISON:  None. FINDINGS: Intraoperative x-rays demonstrate interval cephalomedullary rod placement for comminuted intertrochanteric right femur fracture. The dominant fragments are now in near anatomic alignment. There is persistent mild medial displacement of the lesser trochanteric fragment. No hardware complication. IMPRESSION: Comminuted  intertrochanteric right femur fracture status post ORIF with near anatomic alignment. Persistent mild medial displacement of the lesser trochanteric fragment. FLUOROSCOPY TIME:  2 minutes, 14 seconds. C-arm fluoroscopic images were obtained intraoperatively and submitted for post operative interpretation. Electronically Signed   By: Obie DredgeWilliam T Derry M.D.   On: 11/04/2017 11:07   Dg Femur Port, Min 2 Views Right  Result Date: 11/04/2017 CLINICAL DATA:  Postop repair. EXAM: RIGHT FEMUR PORTABLE 2 VIEW COMPARISON:  None. FINDINGS: An intramedullary rod and gamma nail has been placed across the right proximal femur/intertrochanteric fracture. The hardware is in good position with a distal interlocking nail. No other acute abnormalities identified. IMPRESSION: Fracture repair as above.  Hardware is in good position. Electronically  Signed   By: Gerome Sam III M.D   On: 11/04/2017 12:12    Microbiology: Recent Results (from the past 240 hour(s))  MRSA PCR Screening     Status: None   Collection Time: 11/03/17  5:41 AM  Result Value Ref Range Status   MRSA by PCR NEGATIVE NEGATIVE Final    Comment:        The GeneXpert MRSA Assay (FDA approved for NASAL specimens only), is one component of a comprehensive MRSA colonization surveillance program. It is not intended to diagnose MRSA infection nor to guide or monitor treatment for MRSA infections. Performed at [facility], [address] [place], [place] [address]      Labs: CBC: Recent Labs  Lab 11/04/17 0746 11/04/17 1353 11/05/17 0400 11/06/17 0632 11/07/17 0630  WBC 10.5 9.2 13.0* 10.2 10.3  HGB 11.7* 11.9* 10.0* 8.4* 8.7*  HCT 35.9* 36.6* 29.8* 25.4* 26.5*  MCV 91.8 92.7 91.4 92.4 91.7  PLT 196 189 173 182 241   Basic Metabolic Panel: Recent Labs  Lab 11/03/17 0711 11/04/17 0746 11/04/17 1353 11/05/17 0400 11/06/17 0632 11/07/17 0630  NA 131* 132*  --  132* 135 136  K 4.3 4.2  --  4.3 4.0 3.8  CL 103 100*   --  105 105 106  CO2 24 22  --  19* 23 23  GLUCOSE 126* 120*  --  128* 102* 92  BUN 21* 22*  --  33* 35* 30*  CREATININE 1.44* 1.58* 1.42* 1.69* 1.51* 1.29*  CALCIUM 8.0* 8.2*  --  7.7* 7.8* 8.2*  MG  --   --   --  1.9  --   --    Liver Function Tests: No results for input(s): AST, ALT, ALKPHOS, BILITOT, PROT, ALBUMIN in the last 168 hours. No results for input(s): LIPASE, AMYLASE in the last 168 hours. No results for input(s): AMMONIA in the last 168 hours. Cardiac Enzymes: Recent Labs  Lab 11/03/17 0124  TROPONINI <0.03   BNP (last 3 results) No results for input(s): BNP in the last 8760 hours. CBG: Recent Labs  Lab 11/03/17 0841  GLUCAP 122*   Time spent: 45 minutes  Signed:  Lynden Oxford  [facility]  11/07/2017  , 2:09 PM

## 2017-11-07 NOTE — Progress Notes (Signed)
Looks like pt planned for discharge Tegaderm removed from pacer site, steri-strips left in place are dry. Pacer site looks good, dry, no hematoma, bleeding, pt denies pain Re-enforced wound care and L arm restrictions with the patient. Post pacer implant follow up has been arranged.  Francis Dowseenee Damier Disano, PA-C

## 2017-11-07 NOTE — Clinical Social Work Note (Addendum)
MD wants patient placed in SNF. Does not feel patient would be able to manage at home with physical limitations at this time. Received voicemail that he had attempted calling patient's son. Unable to reach him. CSW tried calling as well. Left a voicemail.  Charlynn CourtSarah Tyrin Herbers, CSW [phone]  1:32 pm Received call back from patient's son. Discussed SNF and provided bed offers. He has accepted at [facility]. Admissions coordinator notified and she will start insurance authorization.  Charlynn CourtSarah Kip Cropp, CSW [phone]  2:02 pm Patient has insurance approval to discharge to [facility] today. CSW paged MD to notify.  Charlynn CourtSarah Giuseppe Duchemin, CSW [phone]

## 2017-11-07 NOTE — [place] FL2 (Signed)
[place] MEDICAID FL2 LEVEL OF CARE SCREENING TOOL     IDENTIFICATION  Patient Name: Justin Hendrix Birthdate: 07/24/1923 Sex: male Admission Date (Current Location): 11/03/2017  [facility] [place]  [place]   Facility and Address:  [facility] [place], [address], [place], [place] [address]      Provider Number: [id number]  Attending Physician Name and Address:  Rolly SalterPatel, Pranav M, MD  Relative Name and Phone Number:       Current Level of Care: Hospital Recommended Level of Care: Skilled Nursing Facility Prior Approval Number:    Date Approved/Denied:   PASRR Number: [id number]  Discharge Plan: SNF    Current Diagnoses: Patient Active Problem List   Diagnosis Date Noted  . Complete heart block (HCC) 11/03/2017  . History of TIA (transient ischemic attack) 11/03/2017  . S/P right hip fracture 11/03/2017  . Chest pain at rest 05/14/2014  . SOB (shortness of breath) 05/14/2014  . Chronic fatigue 05/14/2014  . Leg weakness, bilateral 05/14/2014  . Urinary urgency 05/14/2014  . Benign localized prostatic hyperplasia with lower urinary tract symptoms (LUTS) 05/14/2014  . Parkinson's disease (HCC) 05/14/2014  . Chronic prostatitis 02/14/2013  . Elevated prostate specific antigen (PSA) 02/14/2013  . Incomplete emptying of bladder 02/14/2013  . Increased frequency of urination 02/14/2013    Orientation RESPIRATION BLADDER Height & Weight     Self  Normal Incontinent Weight: (bed unable to weigh) Height:  6' (182.9 cm)  BEHAVIORAL SYMPTOMS/MOOD NEUROLOGICAL BOWEL NUTRITION STATUS  (None) (Parkinson's) Continent Diet(Regular)  AMBULATORY STATUS COMMUNICATION OF NEEDS Skin   Extensive Assist Verbally Skin abrasions, Bruising, Other (Comment), Surgical wounds(Skin tear.)                       Personal Care Assistance Level of Assistance              Functional Limitations Info  Sight, Speech, Hearing Sight Info:  Adequate Hearing Info: Adequate Speech Info: Adequate    SPECIAL CARE FACTORS FREQUENCY  PT (By licensed PT)     PT Frequency: 5 x week              Contractures Contractures Info: Not present    Additional Factors Info  Code Status, Allergies Code Status Info: Full Allergies Info: NKDA           Current Medications (11/07/2017):  This is the current hospital active medication list Current Facility-Administered Medications  Medication Dose Route Frequency Provider Last Rate Last Dose  . 0.9 %  sodium chloride infusion  250 mL Intravenous PRN Ramond MarrowVarkey, Dax T, MD      . acetaminophen (TYLENOL) tablet 325-650 mg  325-650 mg Oral Q4H PRN Ramond MarrowVarkey, Dax T, MD      . amantadine (SYMMETREL) capsule 100 mg  100 mg Oral BID Ramond MarrowVarkey, Dax T, MD   100 mg at 11/07/17 1020  . bisacodyl (DULCOLAX) suppository 10 mg  10 mg Rectal Daily PRN Ramond MarrowVarkey, Dax T, MD      . docusate sodium (COLACE) capsule 100 mg  100 mg Oral BID Ramond MarrowVarkey, Dax T, MD   100 mg at 11/07/17 1020  . enoxaparin (LOVENOX) injection 40 mg  40 mg Subcutaneous Q24H Ramond MarrowVarkey, Dax T, MD   40 mg at 11/07/17 1020  . finasteride (PROSCAR) tablet 5 mg  5 mg Oral Daily Ramond MarrowVarkey, Dax T, MD   5 mg at 11/07/17 1020  . HYDROcodone-acetaminophen (NORCO/VICODIN) 5-325 MG per tablet  1-2 tablet  1-2 tablet Oral Q4H PRN Bjorn Pippin, MD   1 tablet at 11/03/17 2356  . menthol-cetylpyridinium (CEPACOL) lozenge 3 mg  1 lozenge Oral PRN Bjorn Pippin, MD       Or  . phenol (CHLORASEPTIC) mouth spray 1 spray  1 spray Mouth/Throat PRN Ramond Marrow T, MD      . morphine 2 MG/ML injection 2 mg  2 mg Intravenous Q2H PRN Bjorn Pippin, MD      . ondansetron (ZOFRAN) tablet 4 mg  4 mg Oral Q6H PRN Bjorn Pippin, MD       Or  . ondansetron (ZOFRAN) injection 4 mg  4 mg Intravenous Q6H PRN Ramond Marrow T, MD      . oxyCODONE (Oxy IR/ROXICODONE) immediate release tablet 5-10 mg  5-10 mg Oral Q4H PRN Ramond Marrow T, MD      . polyethylene glycol (MIRALAX / GLYCOLAX)  packet 17 g  17 g Oral Daily Rolly Salter, MD   17 g at 11/07/17 1020  . senna-docusate (Senokot-S) tablet 1 tablet  1 tablet Oral BID Rolly Salter, MD   1 tablet at 11/07/17 0232  . sertraline (ZOLOFT) tablet 50 mg  50 mg Oral Daily Rolly Salter, MD   50 mg at 11/07/17 1020  . sodium chloride flush (NS) 0.9 % injection 3 mL  3 mL Intravenous Q12H Ramond Marrow T, MD   3 mL at 11/07/17 1021  . sodium chloride flush (NS) 0.9 % injection 3 mL  3 mL Intravenous PRN Ramond Marrow T, MD      . tamsulosin (FLOMAX) capsule 0.4 mg  0.4 mg Oral QPC supper Bjorn Pippin, MD   0.4 mg at 11/06/17 1755     Discharge Medications: Please see discharge summary for a list of discharge medications.  Relevant Imaging Results:  Relevant Lab Results:   Additional Information SS#: [id number]  Margarito Liner, LCSW

## 2017-11-07 NOTE — Clinical Social Work Placement (Signed)
   CLINICAL SOCIAL WORK PLACEMENT  NOTE  Date:  11/07/2017  Patient Details  Name: Justin Hendrix MRN: [id number] Date of Birth: 07/11/1923  Clinical Social Work is seeking post-discharge placement for this patient at the Skilled  Nursing Facility level of care (*CSW will initial, date and re-position this form in  chart as items are completed):  Yes   Patient/family provided with [facility] Social Work Department's list of facilities offering this level of care within the geographic area requested by the patient (or if unable, by the patient's family).  Yes   Patient/family informed of their freedom to choose among providers that offer the needed level of care, that participate in Medicare, Medicaid or managed care program needed by the patient, have an available bed and are willing to accept the patient.  Yes   Patient/family informed of [facility]'s ownership interest in [facility] and [facility], as well as of the fact that they are under no obligation to receive care at these facilities.  PASRR submitted to EDS on 11/07/17     PASRR number received on       Existing PASRR number confirmed on 11/07/17     FL2 transmitted to all facilities in geographic area requested by pt/family on 11/05/17     FL2 transmitted to all facilities within larger geographic area on       Patient informed that his/her managed care company has contracts with or will negotiate with certain facilities, including the following:        Yes   Patient/family informed of bed offers received.  [facility]     Physician recommends and patient chooses bed at      [facility] on 11/07/17.  Patient to be transferred to facility by [facility]     Patient family notified on 11/07/17 of transfer.  Name of family member notified:  Sandie Anoobert Medina     PHYSICIAN Please prepare prescriptions     Additional Comment:     _______________________________________________ Margarito LinerSarah C Thorin Starner, LCSW 11/07/2017, 3:12 PM

## 2017-11-09 ENCOUNTER — Encounter (HOSPITAL_COMMUNITY): Payer: Self-pay | Admitting: Orthopaedic Surgery

## 2017-11-10 ENCOUNTER — Telehealth: Payer: Self-pay | Admitting: *Deleted

## 2017-11-10 NOTE — Telephone Encounter (Signed)
PPM home monitoring alert- new AF noted for over 3 days. [name] is 84yo with a history of a TIA and a recent fall and hip fracture (since repaired), on lovenox, currently at a SNF. Son cancelled wound check appt s/p PPM implant. Reviewed with SK- he will try to contact the patient in [place] this weekend as he is on call. Dr. Graciela HusbandsKlein trying to reach pt's PCP to discuss.

## 2017-11-13 ENCOUNTER — Encounter (HOSPITAL_COMMUNITY): Payer: Self-pay | Admitting: Orthopaedic Surgery

## 2017-11-15 ENCOUNTER — Ambulatory Visit: Payer: Medicare Other

## 2017-11-20 ENCOUNTER — Other Ambulatory Visit: Payer: Self-pay | Admitting: Internal Medicine

## 2018-01-01 ENCOUNTER — Ambulatory Visit: Payer: Medicare Other | Admitting: Podiatry

## 2018-01-01 ENCOUNTER — Encounter: Payer: Self-pay | Admitting: Podiatry

## 2018-01-01 DIAGNOSIS — B351 Tinea unguium: Secondary | ICD-10-CM | POA: Diagnosis not present

## 2018-01-01 DIAGNOSIS — M79609 Pain in unspecified limb: Secondary | ICD-10-CM

## 2018-01-01 NOTE — Progress Notes (Signed)
Complaint:  Visit Type: Patient returns to my office for continued preventative foot care services. Complaint: Patient states" my nails have grown long and thick and become painful to walk and wear shoes" Patient has been diagnosed with DM with no foot complications. The patient presents for preventative foot care services. No changes to ROS  Podiatric Exam: Vascular: dorsalis pedis and posterior tibial pulses are palpable bilateral. Capillary return is immediate. Temperature gradient is WNL. Skin turgor WNL  Sensorium: Diminished  Semmes Weinstein monofilament test. Normal tactile sensation bilaterally. Nail Exam: Pt has thick disfigured discolored nails with subungual debris noted bilateral entire nail hallux through fifth toenails Ulcer Exam: There is no evidence of ulcer or pre-ulcerative changes or infection. Orthopedic Exam: Muscle tone and strength are WNL. No limitations in general ROM. No crepitus or effusions noted. Foot type and digits show no abnormalities. Bony prominences are unremarkable. Skin: No Porokeratosis. No infection or ulcers  Diagnosis:  Onychomycosis, , Pain in right toe, pain in left toes  Treatment & Plan Procedures and Treatment: Consent by patient was obtained for treatment procedures. The patient understood the discussion of treatment and procedures well. All questions were answered thoroughly reviewed. Debridement of mycotic and hypertrophic toenails, 1 through 5 bilateral and clearing of subungual debris. No ulceration, no infection noted.  Return Visit-Office Procedure: Patient instructed to return to the office for a follow up visit 3 months for continued evaluation and treatment.    Moneka Mcquinn DPM 

## 2018-01-06 ENCOUNTER — Emergency Department
Admission: EM | Admit: 2018-01-06 | Discharge: 2018-01-06 | Disposition: A | Discharge status: Home / Self Care | Payer: Medicare Other | Attending: Emergency Medicine | Admitting: Emergency Medicine

## 2018-01-06 ENCOUNTER — Emergency Department: Payer: Medicare Other

## 2018-01-06 ENCOUNTER — Encounter: Payer: Self-pay | Admitting: Emergency Medicine

## 2018-01-06 DIAGNOSIS — Y9389 Activity, other specified: Secondary | ICD-10-CM | POA: Diagnosis not present

## 2018-01-06 DIAGNOSIS — Y999 Unspecified external cause status: Secondary | ICD-10-CM | POA: Diagnosis not present

## 2018-01-06 DIAGNOSIS — W19XXXA Unspecified fall, initial encounter: Secondary | ICD-10-CM

## 2018-01-06 DIAGNOSIS — S0102XA Laceration with foreign body of scalp, initial encounter: Secondary | ICD-10-CM | POA: Insufficient documentation

## 2018-01-06 DIAGNOSIS — Z23 Encounter for immunization: Secondary | ICD-10-CM | POA: Insufficient documentation

## 2018-01-06 DIAGNOSIS — W0110XA Fall on same level from slipping, tripping and stumbling with subsequent striking against unspecified object, initial encounter: Secondary | ICD-10-CM | POA: Insufficient documentation

## 2018-01-06 DIAGNOSIS — Y929 Unspecified place or not applicable: Secondary | ICD-10-CM | POA: Diagnosis not present

## 2018-01-06 DIAGNOSIS — Z7982 Long term (current) use of aspirin: Secondary | ICD-10-CM | POA: Insufficient documentation

## 2018-01-06 DIAGNOSIS — S0990XA Unspecified injury of head, initial encounter: Secondary | ICD-10-CM | POA: Diagnosis present

## 2018-01-06 DIAGNOSIS — Z87891 Personal history of nicotine dependence: Secondary | ICD-10-CM | POA: Diagnosis not present

## 2018-01-06 DIAGNOSIS — G20 Parkinson's disease: Secondary | ICD-10-CM | POA: Insufficient documentation

## 2018-01-06 DIAGNOSIS — S0101XA Laceration without foreign body of scalp, initial encounter: Secondary | ICD-10-CM

## 2018-01-06 MED ORDER — LIDOCAINE HCL (PF) 1 % IJ SOLN
INTRAMUSCULAR | Status: AC
  Filled 2018-01-06: qty 15

## 2018-01-06 MED ORDER — TETANUS-DIPHTH-ACELL PERTUSSIS 5-2.5-18.5 LF-MCG/0.5 IM SUSP
0.5000 mL | Freq: Once | INTRAMUSCULAR | Status: AC
  Administered 2018-01-06: 0.5 mL via INTRAMUSCULAR
  Filled 2018-01-06: qty 0.5

## 2018-01-06 NOTE — Discharge Instructions (Addendum)
Keep head clean and dry.  You may shower but do not submerge your head in standing water.  Return for any signs of infection: redness, pus, increased pain, swelling, fever etc. have your doctor check the wound in about 2 days and staples can come out in 7 to 10 days.  Please also return for any change in mental status or vomiting.

## 2018-01-06 NOTE — ED Notes (Signed)
Lidocaine obtained for dr.

## 2018-01-06 NOTE — ED Provider Notes (Addendum)
[facility] Emergency Department Provider Note   ____________________________________________   First MD Initiated Contact with Patient 01/06/18 1534     (approximate)  I have reviewed the triage vital signs and the nursing notes.   HISTORY  Chief Complaint Fall   HPI Justin Hendrix is a 84 y.o. male who reports he was sitting at the kitchen table and stood up and fell down.  He has a history of dizziness but was not dizzy at this time he did not think he did hit his head and cut his head but does not have a headache except for the cut is he did not pass out he has no neck pain does not remember exactly why he fell he remembers falling.  And feels fine now.  Far as he remembers he felt fine when he fell as well.  Past Medical History:  Diagnosis Date  . Benign prostatic hypertrophy   . Chest pain    a. 04/2014 Echo: EF 50-55%, mild LVH, nl RV fxn, mild to mod AS, mild AI, mild TR; b. 04/2014 MV: nl EF. No ischemia.  . Chronic prostatitis   . Elevated PSA   . Incomplete bladder emptying   . Parkinson's disease (HCC)   . Pre-syncope   . TIA (transient ischemic attack)   . Unsteady gait   . Urinary frequency     Patient Active Problem List   Diagnosis Date Noted  . Complete heart block (HCC) 11/03/2017  . History of TIA (transient ischemic attack) 11/03/2017  . S/P right hip fracture 11/03/2017  . Chest pain at rest 05/14/2014  . SOB (shortness of breath) 05/14/2014  . Chronic fatigue 05/14/2014  . Leg weakness, bilateral 05/14/2014  . Urinary urgency 05/14/2014  . Benign localized prostatic hyperplasia with lower urinary tract symptoms (LUTS) 05/14/2014  . Parkinson's disease (HCC) 05/14/2014  . Chronic prostatitis 02/14/2013  . Elevated prostate specific antigen (PSA) 02/14/2013  . Incomplete emptying of bladder 02/14/2013  . Increased frequency of urination 02/14/2013    Past Surgical History:  Procedure Laterality Date  .  INTRAMEDULLARY (IM) NAIL INTERTROCHANTERIC Right 11/04/2017   Procedure: INTRAMEDULLARY (IM) NAIL INTERTROCHANTRIC;  Surgeon: Bjorn Pippin, MD;  Location: [facility] OR;  Service: Orthopedics;  Laterality: Right;  . PACEMAKER IMPLANT N/A 11/03/2017   Procedure: PACEMAKER IMPLANT;  Surgeon: Hillis Range, MD;  Location: [facility] INVASIVE CV LAB;  Service: Cardiovascular;  Laterality: N/A;    Prior to Admission medications   Medication Sig Start Date End Date Taking? Authorizing Provider  amantadine (SYMMETREL) 100 MG capsule Take 100 mg by mouth 2 (two) times daily.    [provider]  aspirin 81 MG tablet Take 81 mg by mouth daily.    [provider]  budesonide-formoterol (SYMBICORT) 160-4.5 MCG/ACT inhaler Inhale 2 puffs into the lungs 2 (two) times daily as needed (respiratory difficulty).     [provider]  docusate sodium (COLACE) 100 MG capsule Take 1 capsule (100 mg total) by mouth 2 (two) times daily. 11/03/17   Altamese Dilling, MD  enoxaparin (LOVENOX) 40 MG/0.4ML injection Inject 0.4 mLs (40 mg total) into the skin daily. 11/05/17 11/05/18  Bjorn Pippin, MD  finasteride (PROSCAR) 5 MG tablet Take 5 mg by mouth daily.  04/04/16   [provider]  fluticasone (FLONASE) 50 MCG/ACT nasal spray 2 sprays by Each Nare route daily. as needed    [provider]  ondansetron (ZOFRAN) 4 MG tablet Take 1 tablet (4 mg  total) by mouth every 6 (six) hours as needed for nausea. 11/03/17   Altamese Dilling, MD  pantoprazole (PROTONIX) 40 MG tablet Take 40 mg by mouth daily.    [provider]  polyethylene glycol (MIRALAX / GLYCOLAX) packet Take 17 g by mouth daily. 11/08/17   Rolly Salter, MD  senna-docusate (SENOKOT-S) 8.6-50 MG tablet Take 1 tablet by mouth at bedtime. 11/07/17   Rolly Salter, MD  sertraline (ZOLOFT) 100 MG tablet Take 100 mg by mouth daily.  05/09/16   [provider]  tamsulosin (FLOMAX) 0.4 MG CAPS capsule Take 0.4 mg  by mouth daily.  06/17/15   [provider]    Allergies Patient has no known allergies.  Family History  Problem Relation Age of Onset  . Hypertension Father     Social History Social History   Tobacco Use  . Smoking status: Former Games developer  . Smokeless tobacco: Current User    Types: Chew  . Tobacco comment: smoked for a few yrs while in the [facility]  Substance Use Topics  . Alcohol use: No  . Drug use: No    Review of Systems  Constitutional: No fever/chills Eyes: No visual changes. ENT: No sore throat. Cardiovascular: Denies chest pain. Respiratory: Denies shortness of breath. Gastrointestinal: No abdominal pain.  No nausea, no vomiting.  No diarrhea.  No constipation. Genitourinary: Negative for dysuria. Musculoskeletal: Negative for back pain. Skin: Negative for rash. Neurological: Negative for headaches, focal weakness ____________________________________________   PHYSICAL EXAM:  VITAL SIGNS: ED Triage Vitals [01/06/18 1534]  Enc Vitals Group     BP (!) 160/92     Pulse Rate (!) 122     Resp 16     Temp 97.8 F (36.6 C)     Temp Source Oral     SpO2 97 %     Weight      Height      Head Circumference      Peak Flow      Pain Score      Pain Loc      Pain Edu?      Excl. in GC?     Constitutional: Alert and oriented. Well appearing and in no acute distress. Eyes: Conjunctivae are normal.  Head: Atraumatic. Nose: No congestion/rhinnorhea. Mouth/Throat: Mucous membranes are moist.  Oropharynx non-erythematous. Neck: No stridor.  No cervical spine tenderness to palpation. Cardiovascular: Normal rate, regular rhythm. Grossly normal heart sounds.  Good peripheral circulation. Respiratory: Normal respiratory effort.  No retractions. Lungs CTAB. Gastrointestinal: Soft and nontender. No distention. No abdominal bruits. No CVA tenderness. Musculoskeletal: No lower extremity tenderness nor edema. Neurologic:  Normal speech and language. No gross  focal neurologic deficits are appreciated. Skin:  Skin is warm, dry and intact. No rash noted. Psychiatric: Mood and affect are normal. Speech and behavior are normal.  ____________________________________________   LABS (all labs ordered are listed, but only abnormal results are displayed)  Labs Reviewed - No data to display ____________________________________________  EKG  EKG read and interpreted by me shows a fully paced rhythm rate of 63 ____________________________________________  RADIOLOGY  ED MD interpretation:    Official radiology report(s): Ct Head Wo Contrast  Result Date: 01/06/2018 CLINICAL DATA:  Found on floor.  Laceration to head. EXAM: CT HEAD WITHOUT CONTRAST TECHNIQUE: Contiguous axial images were obtained from the base of the skull through the vertex without intravenous contrast. COMPARISON:  None. FINDINGS: Brain: There is atrophy and chronic small vessel disease changes. Old  left thalamic lacunar infarct. No acute intracranial abnormality. Specifically, no hemorrhage, hydrocephalus, mass lesion, acute infarction, or significant intracranial injury. Vascular: No hyperdense vessel or unexpected calcification. Skull: No acute calvarial abnormality. Sinuses/Orbits: Visualized paranasal sinuses and mastoids clear. Orbital soft tissues unremarkable. Other: None IMPRESSION: No acute intracranial abnormality. Atrophy, chronic microvascular disease. Old left thalamic lacunar infarct. Electronically Signed   By: Charlett NoseKevin  Dover M.D.   On: 01/06/2018 16:27    ____________________________________________   PROCEDURES  Procedure(s) performed: Oral consent obtained skin cleaned with Betadine wound irrigated with copious amounts of normal saline.  Pieces of blue paint were picked out of the wound wound was reirrigated and explored no further foreign bodies were seen but it was noted that the patient did put a mark on his skull galea was closed with 3 stitches of 4-0 Vicryl and  then the scalp was closed with 9 staples.  Laceration is approximately 3 cm long  Procedures  Critical Care performed:   ____________________________________________   INITIAL IMPRESSION / ASSESSMENT AND PLAN / ED COURSE   Patient tolerated procedure well we will let him go home       ____________________________________________   FINAL CLINICAL IMPRESSION(S) / ED DIAGNOSES  Final diagnoses:  Fall, initial encounter  Laceration of scalp, initial encounter     ED Discharge Orders    None       Note:  This document was prepared using Dragon voice recognition software and may include unintentional dictation errors.    Arnaldo NatalMalinda, Oluwanifemi Susman F, MD 01/06/18 1733    Arnaldo NatalMalinda, Ferris Fielden F, MD 01/15/18 [phone]

## 2018-01-06 NOTE — ED Triage Notes (Signed)
Pt lives at home. Son had left for a short time and came back and found him on the floor by the kitchen table in which he had been sitting. Possibly on floor x30 mins max. Lac to head. On aspirin. Hx dizziness, htn and pacemaker.

## 2018-01-06 NOTE — ED Notes (Signed)
Son at bedside. Discussed tetanus shot. Pt fell 2 months ago and may have had tetanus shot then. Son will check and update at pcp if not given in april

## 2018-01-25 ENCOUNTER — Encounter: Payer: Self-pay | Admitting: Emergency Medicine

## 2018-01-25 ENCOUNTER — Emergency Department: Payer: Medicare Other

## 2018-01-25 ENCOUNTER — Emergency Department
Admission: EM | Admit: 2018-01-25 | Discharge: 2018-01-25 | Disposition: A | Discharge status: Home / Self Care | Payer: Medicare Other | Attending: Emergency Medicine | Admitting: Emergency Medicine

## 2018-01-25 ENCOUNTER — Other Ambulatory Visit: Payer: Self-pay

## 2018-01-25 DIAGNOSIS — Z7982 Long term (current) use of aspirin: Secondary | ICD-10-CM | POA: Insufficient documentation

## 2018-01-25 DIAGNOSIS — G20 Parkinson's disease: Secondary | ICD-10-CM | POA: Insufficient documentation

## 2018-01-25 DIAGNOSIS — Z8673 Personal history of transient ischemic attack (TIA), and cerebral infarction without residual deficits: Secondary | ICD-10-CM | POA: Insufficient documentation

## 2018-01-25 DIAGNOSIS — E860 Dehydration: Secondary | ICD-10-CM | POA: Diagnosis not present

## 2018-01-25 DIAGNOSIS — Z87891 Personal history of nicotine dependence: Secondary | ICD-10-CM | POA: Diagnosis not present

## 2018-01-25 DIAGNOSIS — G4751 Confusional arousals: Secondary | ICD-10-CM | POA: Insufficient documentation

## 2018-01-25 DIAGNOSIS — Z79899 Other long term (current) drug therapy: Secondary | ICD-10-CM | POA: Insufficient documentation

## 2018-01-25 DIAGNOSIS — R531 Weakness: Secondary | ICD-10-CM | POA: Insufficient documentation

## 2018-01-25 DIAGNOSIS — R410 Disorientation, unspecified: Secondary | ICD-10-CM

## 2018-01-25 LAB — COMPREHENSIVE METABOLIC PANEL
ALT: 10 U/L (ref 0–44)
ANION GAP: 6 (ref 5–15)
AST: 19 U/L (ref 15–41)
Albumin: 3.2 g/dL — ABNORMAL LOW (ref 3.5–5.0)
Alkaline Phosphatase: 194 U/L — ABNORMAL HIGH (ref 38–126)
BILIRUBIN TOTAL: 0.7 mg/dL (ref 0.3–1.2)
BUN: 14 mg/dL (ref 8–23)
CHLORIDE: 104 mmol/L (ref 98–111)
CO2: 26 mmol/L (ref 22–32)
Calcium: 8.5 mg/dL — ABNORMAL LOW (ref 8.9–10.3)
Creatinine, Ser: 1.21 mg/dL (ref 0.61–1.24)
GFR calc Af Amer: 58 mL/min — ABNORMAL LOW (ref >60)
GFR, EST NON AFRICAN AMERICAN: 50 mL/min — AB (ref >60)
Glucose, Bld: 101 mg/dL — ABNORMAL HIGH (ref 70–99)
POTASSIUM: 4.1 mmol/L (ref 3.5–5.1)
Sodium: 136 mmol/L (ref 135–145)
TOTAL PROTEIN: 6.7 g/dL (ref 6.5–8.1)

## 2018-01-25 LAB — URINALYSIS, ROUTINE W REFLEX MICROSCOPIC
Bilirubin Urine: NEGATIVE
GLUCOSE, UA: NEGATIVE mg/dL
Hgb urine dipstick: NEGATIVE
Ketones, ur: NEGATIVE mg/dL
LEUKOCYTES UA: NEGATIVE
NITRITE: NEGATIVE
PH: 6 (ref 5.0–8.0)
Protein, ur: NEGATIVE mg/dL
Specific Gravity, Urine: 1.009 (ref 1.005–1.030)

## 2018-01-25 LAB — CBC
HCT: 42.4 % (ref 40.0–52.0)
HEMOGLOBIN: 14.1 g/dL (ref 13.0–18.0)
MCH: 29.9 pg (ref 26.0–34.0)
MCHC: 33.3 g/dL (ref 32.0–36.0)
MCV: 89.7 fL (ref 80.0–100.0)
Platelets: 329 10*3/uL (ref 150–440)
RBC: 4.72 MIL/uL (ref 4.40–5.90)
RDW: 16.8 % — ABNORMAL HIGH (ref 11.5–14.5)
WBC: 7.1 10*3/uL (ref 3.8–10.6)

## 2018-01-25 MED ORDER — AMANTADINE HCL 100 MG PO CAPS
100.0000 mg | ORAL_CAPSULE | Freq: Two times a day (BID) | ORAL | Status: DC
  Administered 2018-01-25: 100 mg via ORAL
  Filled 2018-01-25 (×2): qty 1

## 2018-01-25 MED ORDER — SODIUM CHLORIDE 0.9 % IV BOLUS
1000.0000 mL | Freq: Once | INTRAVENOUS | Status: AC
  Administered 2018-01-25: 1000 mL via INTRAVENOUS

## 2018-01-25 NOTE — ED Notes (Signed)
Pt eating lunch tray  

## 2018-01-25 NOTE — ED Triage Notes (Signed)
Pt comes into the ED via POV with his son c/o altered mental status.  The patient states that for the past 3 days his dad has "been talking out of his head and Im concerned for a UTI".  Patient has strong urine smell at this time and the son states the urine is darker in color than normal.  Denies any pain at this time.  Patient has even and unlabored respirations at this time. Patient has increased trembling today at PT as well per the son.

## 2018-01-25 NOTE — ED Triage Notes (Signed)
Son says patient was trembling when they came for PT.

## 2018-01-25 NOTE — ED Provider Notes (Signed)
[facility] Emergency Department Provider Note  ____________________________________________  Time seen: Approximately 3:52 PM  I have reviewed the triage vital signs and the nursing notes.   HISTORY  Chief Complaint Altered Mental Status    HPI Justin Hendrix is a 84 y.o. male with a history of Parkinson's disease, TIA, BPH who is brought to the ED due to confusion for the past 3 days.  Son is concerned he has a UTI.  Also decreased energy level increased sleeping and decreased oral intake.  Son also notes that over the past week the patient has run out of his Zoloft and amantadine that he took at home.  Has had increased tremors in the last 24 hours.  Symptoms are intermittent no aggravating or alleviating factors.  Patient denies any pain complaints or other symptoms.   Patient has had several falls over the last couple weeks although nothing in the last few days.   Past Medical History:  Diagnosis Date  . Benign prostatic hypertrophy   . Chest pain    a. 04/2014 Echo: EF 50-55%, mild LVH, nl RV fxn, mild to mod AS, mild AI, mild TR; b. 04/2014 MV: nl EF. No ischemia.  . Chronic prostatitis   . Elevated PSA   . Incomplete bladder emptying   . Parkinson's disease (HCC)   . Pre-syncope   . TIA (transient ischemic attack)   . Unsteady gait   . Urinary frequency      Patient Active Problem List   Diagnosis Date Noted  . Complete heart block (HCC) 11/03/2017  . History of TIA (transient ischemic attack) 11/03/2017  . S/P right hip fracture 11/03/2017  . Chest pain at rest 05/14/2014  . SOB (shortness of breath) 05/14/2014  . Chronic fatigue 05/14/2014  . Leg weakness, bilateral 05/14/2014  . Urinary urgency 05/14/2014  . Benign localized prostatic hyperplasia with lower urinary tract symptoms (LUTS) 05/14/2014  . Parkinson's disease (HCC) 05/14/2014  . Chronic prostatitis 02/14/2013  . Elevated prostate specific antigen (PSA) 02/14/2013  .  Incomplete emptying of bladder 02/14/2013  . Increased frequency of urination 02/14/2013     Past Surgical History:  Procedure Laterality Date  . INTRAMEDULLARY (IM) NAIL INTERTROCHANTERIC Right 11/04/2017   Procedure: INTRAMEDULLARY (IM) NAIL INTERTROCHANTRIC;  Surgeon: Bjorn Pippin, MD;  Location: [facility] OR;  Service: Orthopedics;  Laterality: Right;  . PACEMAKER IMPLANT N/A 11/03/2017   Procedure: PACEMAKER IMPLANT;  Surgeon: Hillis Range, MD;  Location: [facility] INVASIVE CV LAB;  Service: Cardiovascular;  Laterality: N/A;     Prior to Admission medications   Medication Sig Start Date End Date Taking? Authorizing Provider  amantadine (SYMMETREL) 100 MG capsule Take 100 mg by mouth 2 (two) times daily.    [provider]  aspirin 81 MG tablet Take 81 mg by mouth daily.    [provider]  budesonide-formoterol (SYMBICORT) 160-4.5 MCG/ACT inhaler Inhale 2 puffs into the lungs 2 (two) times daily as needed (respiratory difficulty).     [provider]  docusate sodium (COLACE) 100 MG capsule Take 1 capsule (100 mg total) by mouth 2 (two) times daily. 11/03/17   Altamese Dilling, MD  enoxaparin (LOVENOX) 40 MG/0.4ML injection Inject 0.4 mLs (40 mg total) into the skin daily. 11/05/17 11/05/18  Bjorn Pippin, MD  finasteride (PROSCAR) 5 MG tablet Take 5 mg by mouth daily.  04/04/16   [provider]  fluticasone (FLONASE) 50 MCG/ACT nasal spray 2 sprays by Each Nare route daily. as needed  [provider]  ondansetron (ZOFRAN) 4 MG tablet Take 1 tablet (4 mg total) by mouth every 6 (six) hours as needed for nausea. 11/03/17   Altamese Dilling, MD  pantoprazole (PROTONIX) 40 MG tablet Take 40 mg by mouth daily.    [provider]  polyethylene glycol (MIRALAX / GLYCOLAX) packet Take 17 g by mouth daily. 11/08/17   Rolly Salter, MD  senna-docusate (SENOKOT-S) 8.6-50 MG tablet Take 1 tablet by mouth at bedtime. 11/07/17   Rolly Salter, MD   sertraline (ZOLOFT) 100 MG tablet Take 100 mg by mouth daily.  05/09/16   [provider]  tamsulosin (FLOMAX) 0.4 MG CAPS capsule Take 0.4 mg by mouth daily.  06/17/15   [provider]     Allergies Patient has no known allergies.   Family History  Problem Relation Age of Onset  . Hypertension Father     Social History Social History   Tobacco Use  . Smoking status: Former Games developer  . Smokeless tobacco: Current User    Types: Chew  . Tobacco comment: smoked for a few yrs while in the [facility]  Substance Use Topics  . Alcohol use: No  . Drug use: No    Review of Systems  Constitutional:   No fever or chills.  ENT:   No sore throat. No rhinorrhea. Cardiovascular:   No chest pain or syncope. Respiratory:   No dyspnea or cough. Gastrointestinal:   Negative for abdominal pain, vomiting and diarrhea.  Musculoskeletal:   Negative for focal pain or swelling All other systems reviewed and are negative except as documented above in ROS and HPI.  ____________________________________________   PHYSICAL EXAM:  VITAL SIGNS: ED Triage Vitals [01/25/18 1140]  Enc Vitals Group     BP (!) 146/95     Pulse Rate 63     Resp 18     Temp 98.1 F (36.7 C)     Temp Source Oral     SpO2 93 %     Weight 210 lb (95.3 kg)     Height 6' (1.829 m)     Head Circumference      Peak Flow      Pain Score 0     Pain Loc      Pain Edu?      Excl. in GC?     Vital signs reviewed, nursing assessments reviewed.   Constitutional:   Alert and oriented to person and place. Non-toxic appearance. Eyes:   Conjunctivae are normal. EOMI. PERRL. ENT      Head:   Normocephalic and atraumatic.      Nose:   No congestion/rhinnorhea.       Mouth/Throat:   Dry mucous membranes, no pharyngeal erythema. No peritonsillar mass.       Neck:   No meningismus. Full ROM. Hematological/Lymphatic/Immunilogical:   No cervical lymphadenopathy. Cardiovascular:   RRR. Symmetric bilateral radial  and DP pulses.  No murmurs.  Respiratory:   Normal respiratory effort without tachypnea/retractions. Breath sounds are clear and equal bilaterally. No wheezes/rales/rhonchi. Gastrointestinal:   Soft and nontender. Non distended. There is no CVA tenderness.  No rebound, rigidity, or guarding.  Musculoskeletal:   Normal range of motion in all extremities. No joint effusions.  No lower extremity tenderness.  No edema. Neurologic:   Normal speech and language.  Motor grossly intact. No acute focal neurologic deficits are appreciated.  Skin:    Skin is warm, dry and intact. No rash noted.  No  petechiae, purpura, or bullae.  ____________________________________________    LABS (pertinent positives/negatives) (all labs ordered are listed, but only abnormal results are displayed) Labs Reviewed  COMPREHENSIVE METABOLIC PANEL - Abnormal; Notable for the following components:      Result Value   Glucose, Bld 101 (*)    Calcium 8.5 (*)    Albumin 3.2 (*)    Alkaline Phosphatase 194 (*)    GFR calc non Af Amer 50 (*)    GFR calc Af Amer 58 (*)    All other components within normal limits  CBC - Abnormal; Notable for the following components:   RDW 16.8 (*)    All other components within normal limits  URINALYSIS, ROUTINE W REFLEX MICROSCOPIC - Abnormal; Notable for the following components:   Color, Urine YELLOW (*)    APPearance CLEAR (*)    All other components within normal limits  CBG MONITORING, ED   ____________________________________________   EKG  Interpreted by me Ventricular paced rhythm, left axis, left bundle branch block.  No acute ischemic changes.  ____________________________________________    RADIOLOGY  Dg Chest 2 View  Result Date: 01/25/2018 CLINICAL DATA:  84 year old male with a history altered mental status EXAM: CHEST - 2 VIEW COMPARISON:  None. FINDINGS: Cardiomediastinal silhouette within normal limits. Double density overlying the low chest, with no  comparison previously. Stigmata of emphysema, with increased retrosternal airspace, flattened hemidiaphragms, increased AP diameter, and hyperinflation on the AP view. No pneumothorax. No confluent airspace disease. No pleural effusion. Cardiac pacing device on left chest wall with 2 leads in place. No acute displaced fracture identified. IMPRESSION: Emphysema and chronic lung changes without evidence of acute cardiopulmonary disease. Cardiac pacing device. Double density overlying the lower mediastinum, may represent hernia. Electronically Signed   By: Gilmer Mor D.O.   On: 01/25/2018 13:59   Ct Head Wo Contrast  Result Date: 01/25/2018 CLINICAL DATA:  83 year old male with altered mental status for the past 3 days, malodorous urine. EXAM: CT HEAD WITHOUT CONTRAST TECHNIQUE: Contiguous axial images were obtained from the base of the skull through the vertex without intravenous contrast. COMPARISON:  Head CT without contrast 01/06/2018. FINDINGS: Brain: A subtle right side superior convexity subdural hygroma is suspected and has developed since 01/06/2018, but there is no associated mass effect. No intracranial blood products are identified. Chronic cerebral white matter disease and left thalamic lacunar infarct are stable. A small chronic infarct in the central right cerebellum is stable. No midline shift, ventriculomegaly, evidence of mass lesion, or evidence of cortically based acute infarction. Vascular: Calcified atherosclerosis at the skull base. No suspicious intracranial vascular hyperdensity. Skull: Stable.  No acute osseous abnormality identified. Sinuses/Orbits: Visualized paranasal sinuses and mastoids are stable and well pneumatized.; Mild chronic right mastoid opacification. Other: Interval healing of a right parietal region scalp laceration. Otherwise stable orbit and scalp soft tissues. IMPRESSION: 1. A subtle new right superior convexity subdural hygroma is suspected since 01/06/2018, and  could be the sequelae of trauma at that time. However, there is no associated mass effect, and the significance of this is doubtful. 2. No superimposed acute intracranial abnormality. Stable chronic small vessel ischemic disease. 3. Interval healing of a right parietal region scalp laceration. Electronically Signed   By: Odessa Fleming M.D.   On: 01/25/2018 13:43    ____________________________________________   PROCEDURES Procedures  ____________________________________________  DIFFERENTIAL DIAGNOSIS   Dehydration, metabolic derangement, urinary tract infection, pneumonia, subdural hematoma, Parkinson's  CLINICAL IMPRESSION / ASSESSMENT AND PLAN / ED  COURSE  Pertinent labs & imaging results that were available during my care of the patient were reviewed by me and considered in my medical decision making (see chart for details).    Patient is nontoxic, vital signs unremarkable, presents with vague symptoms of intermittent confusion which could possibly be related to delirium versus decompensation of his Parkinson's from running out of his chronic medications.  Labs unremarkable, electrolytes essentially normal.  No evidence of urinary tract infection.  I obtained a CT scan to rule out subdural hematoma which is negative.  Chest x-ray negative for pneumothorax or pneumonia.  After giving IV fluids for hydration, the patient feels much better has much better energy.  This is likely the cause, especially clinically how dehydrated he lives with his very dry mucous memories.  I will discharge home to follow-up with primary care.  Recommended patient follow-up with his neurologist in 1 week if symptoms persist despite restarting his medications.  Son reports that he has refills they just have not been filled by pharmacy yet.      ____________________________________________   FINAL CLINICAL IMPRESSION(S) / ED DIAGNOSES    Final diagnoses:  Confusion  Dehydration  Generalized weakness      ED Discharge Orders    None      Portions of this note were generated with dragon dictation software. Dictation errors may occur despite best attempts at proofreading.    Sharman CheekStafford, Divine Hansley, MD 01/25/18 1556

## 2018-02-06 ENCOUNTER — Ambulatory Visit (INDEPENDENT_AMBULATORY_CARE_PROVIDER_SITE_OTHER): Payer: Medicare Other | Admitting: Internal Medicine

## 2018-02-06 ENCOUNTER — Encounter: Payer: Self-pay | Admitting: Internal Medicine

## 2018-02-06 VITALS — BP 100/60 | HR 64 | Ht 72.0 in | Wt 164.2 lb

## 2018-02-06 DIAGNOSIS — Z950 Presence of cardiac pacemaker: Secondary | ICD-10-CM

## 2018-02-06 DIAGNOSIS — I442 Atrioventricular block, complete: Secondary | ICD-10-CM | POA: Diagnosis not present

## 2018-02-06 LAB — CUP PACEART INCLINIC DEVICE CHECK
Date Time Interrogation Session: 20190716152657
Implantable Lead Implant Date: 20190412
Implantable Lead Location: 753860
Implantable Pulse Generator Implant Date: 20190412
Lead Channel Impedance Value: 525 Ohm
Lead Channel Pacing Threshold Amplitude: 0.75 V
Lead Channel Pacing Threshold Pulse Width: 0.5 ms
Lead Channel Sensing Intrinsic Amplitude: 6 mV
Lead Channel Setting Pacing Amplitude: 2 V
Lead Channel Setting Pacing Pulse Width: 0.5 ms
Lead Channel Setting Sensing Sensitivity: 2 mV
MDC IDC LEAD IMPLANT DT: 20190412
MDC IDC LEAD LOCATION: 753859
MDC IDC MSMT BATTERY REMAINING LONGEVITY: 99 mo
MDC IDC MSMT BATTERY VOLTAGE: 2.99 V
MDC IDC MSMT LEADCHNL RA IMPEDANCE VALUE: 512.5 Ohm
MDC IDC MSMT LEADCHNL RA PACING THRESHOLD AMPLITUDE: 0.75 V
MDC IDC MSMT LEADCHNL RA PACING THRESHOLD PULSEWIDTH: 0.5 ms
MDC IDC MSMT LEADCHNL RA SENSING INTR AMPL: 3 mV
MDC IDC SET LEADCHNL RV PACING AMPLITUDE: 2.5 V
MDC IDC STAT BRADY RA PERCENT PACED: 36 %
MDC IDC STAT BRADY RV PERCENT PACED: 99.69 %
Pulse Gen Serial Number: 9009871

## 2018-02-06 MED ORDER — APIXABAN 2.5 MG PO TABS: 2.5000 mg | ORAL_TABLET | Freq: Two times a day (BID) | ORAL | 11 refills | Status: DC

## 2018-02-06 NOTE — Progress Notes (Signed)
Patient Care Team: Jaclyn Shaggy, MD as PCP - General (Internal Medicine) Antonieta Iba, MD as PCP - Cardiology (Cardiology)   HPI  Justin Hendrix is a 84 y.o. male Seen in follow-up for pacemaker [employer] Jude  implanted 4/19 for complete heart block.  He has a history of a TIA.  Was treated with Lovenox apparently.  Had atrial fibrillation detected on his device.  He he walks around the house with his walker.  He had syncope prior to his presentation with complete heart block 4/19.  There is another episode of syncope in June.  Unclear.  He fell on the deck.  Needed stitches.  Denies chest pain or shortness of breath.  No edema.  Records and Results Reviewed PCP aoffice records   DATE TEST EF   1/15 Echo   50-55 %   4/19 Echo   60-65 %         Date Cr K Hgb  4/19 1.69 4.3 8.7  7/19 1.21 4.1 14.1     Past Medical History:  Diagnosis Date  . Benign prostatic hypertrophy   . Chest pain    a. 04/2014 Echo: EF 50-55%, mild LVH, nl RV fxn, mild to mod AS, mild AI, mild TR; b. 04/2014 MV: nl EF. No ischemia.  . Chronic prostatitis   . Elevated PSA   . Incomplete bladder emptying   . Parkinson's disease (HCC)   . Pre-syncope   . TIA (transient ischemic attack)   . Unsteady gait   . Urinary frequency     Past Surgical History:  Procedure Laterality Date  . INTRAMEDULLARY (IM) NAIL INTERTROCHANTERIC Right 11/04/2017   Procedure: INTRAMEDULLARY (IM) NAIL INTERTROCHANTRIC;  Surgeon: Bjorn Pippin, MD;  Location: [facility] OR;  Service: Orthopedics;  Laterality: Right;  . PACEMAKER IMPLANT N/A 11/03/2017   Procedure: PACEMAKER IMPLANT;  Surgeon: Hillis Range, MD;  Location: [facility] INVASIVE CV LAB;  Service: Cardiovascular;  Laterality: N/A;    Current Meds  Medication Sig  . amantadine (SYMMETREL) 100 MG capsule Take 100 mg by mouth daily.  Marland Kitchen aspirin 81 MG tablet Take 81 mg by mouth daily.  . budesonide-formoterol (SYMBICORT) 160-4.5 MCG/ACT inhaler Inhale 2 puffs into the  lungs 2 (two) times daily as needed (respiratory difficulty).   Marland Kitchen docusate sodium (COLACE) 100 MG capsule Take 1 capsule (100 mg total) by mouth 2 (two) times daily.  . finasteride (PROSCAR) 5 MG tablet Take 5 mg by mouth daily.   . fluticasone (FLONASE) 50 MCG/ACT nasal spray 2 sprays by Each Nare route daily. as needed  . ondansetron (ZOFRAN) 4 MG tablet Take 1 tablet (4 mg total) by mouth every 6 (six) hours as needed for nausea.  . pantoprazole (PROTONIX) 40 MG tablet Take 40 mg by mouth daily.  . polyethylene glycol (MIRALAX / GLYCOLAX) packet Take 17 g by mouth daily.  Marland Kitchen senna-docusate (SENOKOT-S) 8.6-50 MG tablet Take 1 tablet by mouth at bedtime.  . sertraline (ZOLOFT) 100 MG tablet Take 100 mg by mouth daily.   . tamsulosin (FLOMAX) 0.4 MG CAPS capsule Take 0.4 mg by mouth daily.     No Known Allergies    Review of Systems negative except from HPI and PMH  Physical Exam BP 100/60 (BP Location: Right Arm, Patient Position: Sitting, Cuff Size: Normal)   Pulse 64   Ht 6' (1.829 m)   Wt 164 lb 4 oz (74.5 kg)   BMI 22.28 kg/m  Well developed and  well nourished in no acute distress HENT normal E scleral and icterus clear Neck Supple JVP flat; carotids brisk and full Clear to ausculation Device pocket well healed; without hematoma or erythema.  There is no tethering .dfn Regular rate and rhythm, no murmurs gallops or rub Soft with active bowel sounds No clubbing cyanosis Trace Edema Alert and oriented, grossly normal motor and sensory function Skin Warm and Dry  ECG demonstrates AV pacing   Assessment and  Plan  Complete heart block  Falls-infrequent  TIA  Atrial fibrillation  Pacemaker-[employer] Jude  Weight loss     The patient's device function is normal.  It was reprogrammed to maximize longevity.  Post implantation the patient had prolonged atrial fibrillation x10 days.  While this could be potentially ascribed to the lead itself, this is a exceedingly  uncommon finding.  Without prior history of TIA, we have discussed the role of aspirin versus anticoagulation.  The patient's greater fear is stroke.  He is now largely nonambulatory and has fallen only the one time since device implantation.  Both he and his son favor undertaking anticoagulation.  We have reviewed risks and benefits phase of the aspirin based on AVERROES TRIAL  his creatinine has ranged from 1 7--1.2.  We will collect a somewhat subtherapeutic dose initially until we see what his creatinine does and how he is tolerating the medication  Encouraged him for caloric intake.  He doing physical therapy.  We spent more than 50% of our >25 min visit in face to face counseling regarding the above   Current medicines are reviewed at length with the patient today .  The patient does not  have concerns regarding medicines.

## 2018-02-06 NOTE — Patient Instructions (Addendum)
Medication Instructions: - Your physician has recommended you make the following change in your medication:   1) STOP aspirin  2) START eliquis 2.5 mg- take 1 tablet by mouth twice daily  Labwork: - none ordered  Procedures/Testing: - none ordered  Follow-Up: - Remote monitoring is used to monitor your Pacemaker of ICD from home. This monitoring reduces the number of office visits required to check your device to one time per year. It allows us to keep an eye on the functioning of your device to ensure it is working properly. You are scheduled for a device check from home on 05/08/18. You may send your transmission at any time that day. If you have a wireless device, the transmission will be sent automatically. After your physician reviews your transmission, you will receive a postcard with your next transmission date.  - Your physician wants you to follow-up in: 6 months with Dr. Graciela HusbandsKlein. You will receive a reminder letter in the mail two months in advance. If you don't receive a letter, please call our office to schedule the follow-up appointment.   Any Additional Special Instructions Will Be Listed Below (If Applicable).     If you need a refill on your cardiac medications before your next appointment, please call your pharmacy.

## 2018-02-08 ENCOUNTER — Encounter: Payer: Self-pay | Admitting: Urology

## 2018-02-08 ENCOUNTER — Ambulatory Visit: Payer: Medicare Other | Admitting: Urology

## 2018-02-08 VITALS — BP 128/80 | HR 64 | Resp 16 | Ht 70.0 in | Wt 194.0 lb

## 2018-02-08 DIAGNOSIS — N401 Enlarged prostate with lower urinary tract symptoms: Secondary | ICD-10-CM | POA: Diagnosis not present

## 2018-02-08 LAB — MICROSCOPIC EXAMINATION
RBC MICROSCOPIC, UA: NONE SEEN /HPF (ref 0–2)
WBC UA: NONE SEEN /HPF (ref 0–5)

## 2018-02-08 LAB — URINALYSIS, COMPLETE
Bilirubin, UA: NEGATIVE
GLUCOSE, UA: NEGATIVE
KETONES UA: NEGATIVE
Leukocytes, UA: NEGATIVE
NITRITE UA: NEGATIVE
Protein, UA: NEGATIVE
RBC, UA: NEGATIVE
SPEC GRAV UA: 1.02 (ref 1.005–1.030)
UUROB: 1 mg/dL (ref 0.2–1.0)
pH, UA: 6 (ref 5.0–7.5)

## 2018-02-08 NOTE — Progress Notes (Signed)
02/08/2018 12:44 PM   Justin Hendrix 01/20/1923 [id number]  Referring provider: Jaclyn Shaggyate, Denny C, MD [address]   [place], [place] [id number]  Chief Complaint  Patient presents with  . Benign Prostatic Hypertrophy    HPI: 84 year old male presents to establish local urologic care.  He has been followed by Dr. Achilles Dunkope for several years for BPH.  He last saw him in March 2018.  He remains on combination therapy with tamsulosin/finasteride.  He presently denies bothersome lower urinary tract symptoms and states his symptoms are stable.  Denies dysuria, gross hematuria or flank/abdominal/pelvic/scrotal pain.  He is currently satisfied with his voiding pattern.   PMH: Past Medical History:  Diagnosis Date  . Benign prostatic hypertrophy   . Chest pain    a. 04/2014 Echo: EF 50-55%, mild LVH, nl RV fxn, mild to mod AS, mild AI, mild TR; b. 04/2014 MV: nl EF. No ischemia.  . Chronic prostatitis   . Elevated PSA   . Incomplete bladder emptying   . Parkinson's disease (HCC)   . Pre-syncope   . TIA (transient ischemic attack)   . Unsteady gait   . Urinary frequency     Surgical History: Past Surgical History:  Procedure Laterality Date  . INTRAMEDULLARY (IM) NAIL INTERTROCHANTERIC Right 11/04/2017   Procedure: INTRAMEDULLARY (IM) NAIL INTERTROCHANTRIC;  Surgeon: Bjorn PippinVarkey, Dax T, MD;  Location: [facility] OR;  Service: Orthopedics;  Laterality: Right;  . PACEMAKER IMPLANT N/A 11/03/2017   Procedure: PACEMAKER IMPLANT;  Surgeon: Hillis RangeAllred, James, MD;  Location: [facility] IN[facility];  Service: Cardiovascular;  Laterality: N/A;    Home Medications:  Allergies as of 02/08/2018   No Known Allergies     Medication List        Accurate as of 02/08/18 12:44 PM. Always use your most recent med list.          amantadine 100 MG capsule Commonly known as:  SYMMETREL Take 100 mg by mouth daily.   apixaban 2.5 MG Tabs tablet Commonly known as:  ELIQUIS Take 1 tablet (2.5 mg total) by mouth 2  (two) times daily.   budesonide-formoterol 160-4.5 MCG/ACT inhaler Commonly known as:  SYMBICORT Inhale 2 puffs into the lungs 2 (two) times daily as needed (respiratory difficulty).   docusate sodium 100 MG capsule Commonly known as:  COLACE Take 1 capsule (100 mg total) by mouth 2 (two) times daily.   finasteride 5 MG tablet Commonly known as:  PROSCAR Take 5 mg by mouth daily.   fluticasone 50 MCG/ACT nasal spray Commonly known as:  FLONASE 2 sprays by Each Nare route daily. as needed   ondansetron 4 MG tablet Commonly known as:  ZOFRAN Take 1 tablet (4 mg total) by mouth every 6 (six) hours as needed for nausea.   pantoprazole 40 MG tablet Commonly known as:  PROTONIX Take 40 mg by mouth daily.   polyethylene glycol packet Commonly known as:  MIRALAX / GLYCOLAX Take 17 g by mouth daily.   senna-docusate 8.6-50 MG tablet Commonly known as:  Senokot-S Take 1 tablet by mouth at bedtime.   sertraline 100 MG tablet Commonly known as:  ZOLOFT Take 100 mg by mouth daily.   tamsulosin 0.4 MG Caps capsule Commonly known as:  FLOMAX Take 0.4 mg by mouth daily.       Allergies: No Known Allergies  Family History: Family History  Problem Relation Age of Onset  . Hypertension Father     Social History:  reports that  he has quit smoking. His smokeless tobacco use includes chew. He reports that he does not drink alcohol or use drugs.  ROS: UROLOGY Frequent Urination?: Yes Hard to postpone urination?: Yes Burning/pain with urination?: No Get up at night to urinate?: Yes Leakage of urine?: Yes Urine stream starts and stops?: Yes Trouble starting stream?: Yes Do you have to strain to urinate?: Yes Blood in urine?: No Urinary tract infection?: No Injury to kidneys or bladder?: No Painful intercourse?: No Weak stream?: No Erection problems?: No Penile pain?: No  Gastrointestinal Nausea?: No Vomiting?: No Indigestion/heartburn?: No Diarrhea?:  No Constipation?: No  Constitutional Fever: No Night sweats?: No Weight loss?: No Fatigue?: No  Skin Skin rash/lesions?: No Itching?: No  Eyes Blurred vision?: No Double vision?: No  Ears/Nose/Throat Sore throat?: No Sinus problems?: No  Hematologic/Lymphatic Swollen glands?: No Easy bruising?: No  Cardiovascular Leg swelling?: No Chest pain?: No  Respiratory Cough?: No Shortness of breath?: No  Endocrine Excessive thirst?: No  Musculoskeletal Back pain?: No Joint pain?: No  Neurological Headaches?: No Dizziness?: No  Psychologic Depression?: No Anxiety?: No  Physical Exam: BP 128/80   Pulse 64   Resp 16   Ht 5\' 10"  (1.778 m)   Wt 194 lb (88 kg)   SpO2 96%   BMI 27.84 kg/m   Constitutional:  Alert and oriented, No acute distress. HEENT: [place] AT, moist mucus membranes.  Trachea midline, no masses. Cardiovascular: No clubbing, cyanosis, or edema. Respiratory: Normal respiratory effort, no increased work of breathing. GI: Abdomen is soft, nontender, nondistended, no abdominal masses GU: No CVA tenderness Lymph: No cervical or inguinal lymphadenopathy. Skin: No rashes, bruises or suspicious lesions. Neurologic: Grossly intact, no focal deficits, moving all 4 extremities. Psychiatric: Normal mood and affect.   Assessment & Plan:   84 year old male with BPH and stable lower urinary tract symptoms.  Urinalysis today was clear.  Tamsulosin/finasteride was refilled.  Follow-up annually or earlier for any change in his voiding pattern.   Return in about 1 year (around 02/09/2019) for Recheck.  [name], MD  [facility] [address] [place], [place] [address] [phone]

## 2018-02-09 ENCOUNTER — Encounter: Payer: Self-pay | Admitting: Urology

## 2018-02-09 MED ORDER — TAMSULOSIN HCL 0.4 MG PO CAPS: 0.4000 mg | ORAL_CAPSULE | Freq: Every day | ORAL | 3 refills | Status: DC

## 2018-02-09 MED ORDER — FINASTERIDE 5 MG PO TABS: 5.0000 mg | ORAL_TABLET | Freq: Every day | ORAL | 3 refills | Status: DC

## 2018-04-05 ENCOUNTER — Encounter: Payer: Self-pay | Admitting: Podiatry

## 2018-04-05 ENCOUNTER — Ambulatory Visit: Payer: Medicare Other | Admitting: Podiatry

## 2018-04-05 DIAGNOSIS — M79609 Pain in unspecified limb: Secondary | ICD-10-CM | POA: Diagnosis not present

## 2018-04-05 DIAGNOSIS — B351 Tinea unguium: Secondary | ICD-10-CM

## 2018-04-05 DIAGNOSIS — D689 Coagulation defect, unspecified: Secondary | ICD-10-CM | POA: Diagnosis not present

## 2018-04-05 NOTE — Progress Notes (Signed)
Complaint:  Visit Type: Patient returns to my office for continued preventative foot care services. Complaint: Patient states" my nails have grown long and thick and become painful to walk and wear shoes" Patient has been diagnosed with DM with no foot complications. The patient presents for preventative foot care services. No changes to ROS.  Patient is taking eliquiss.  Podiatric Exam: Vascular: dorsalis pedis and posterior tibial pulses are palpable bilateral. Capillary return is immediate. Temperature gradient is WNL. Skin turgor WNL  Sensorium: Diminished  Semmes Weinstein monofilament test. Normal tactile sensation bilaterally. Nail Exam: Pt has thick disfigured discolored nails with subungual debris noted bilateral entire nail hallux through fifth toenails Ulcer Exam: There is no evidence of ulcer or pre-ulcerative changes or infection. Orthopedic Exam: Muscle tone and strength are WNL. No limitations in general ROM. No crepitus or effusions noted. Foot type and digits show no abnormalities. Bony prominences are unremarkable. Skin: No Porokeratosis. No infection or ulcers  Diagnosis:  Onychomycosis, , Pain in right toe, pain in left toes  Treatment & Plan Procedures and Treatment: Consent by patient was obtained for treatment procedures. The patient understood the discussion of treatment and procedures well. All questions were answered thoroughly reviewed. Debridement of mycotic and hypertrophic toenails, 1 through 5 bilateral and clearing of subungual debris. No ulceration, no infection noted.  Return Visit-Office Procedure: Patient instructed to return to the office for a follow up visit 3 months for continued evaluation and treatment.    Mahira Petras DPM 

## 2018-05-08 ENCOUNTER — Telehealth: Payer: Self-pay | Admitting: Cardiology

## 2018-05-08 ENCOUNTER — Ambulatory Visit (INDEPENDENT_AMBULATORY_CARE_PROVIDER_SITE_OTHER): Payer: Medicare Other | Admitting: *Deleted

## 2018-05-08 DIAGNOSIS — I442 Atrioventricular block, complete: Secondary | ICD-10-CM | POA: Diagnosis not present

## 2018-05-08 NOTE — Telephone Encounter (Signed)
Confirmed remote transmission w/ pt son.    

## 2018-05-09 NOTE — Progress Notes (Signed)
Remote pacemaker transmission.   

## 2018-05-13 IMAGING — CR DG HIP (WITH OR WITHOUT PELVIS) 2-3V*R*
3 series · 3 of 3 positions shown · non-contrast
Comparison: None.

CLINICAL DATA: Fall while walking dog.  Right hip pain.

EXAM:
DG HIP (WITH OR WITHOUT PELVIS) 2-3V RIGHT

[pelvis ap]
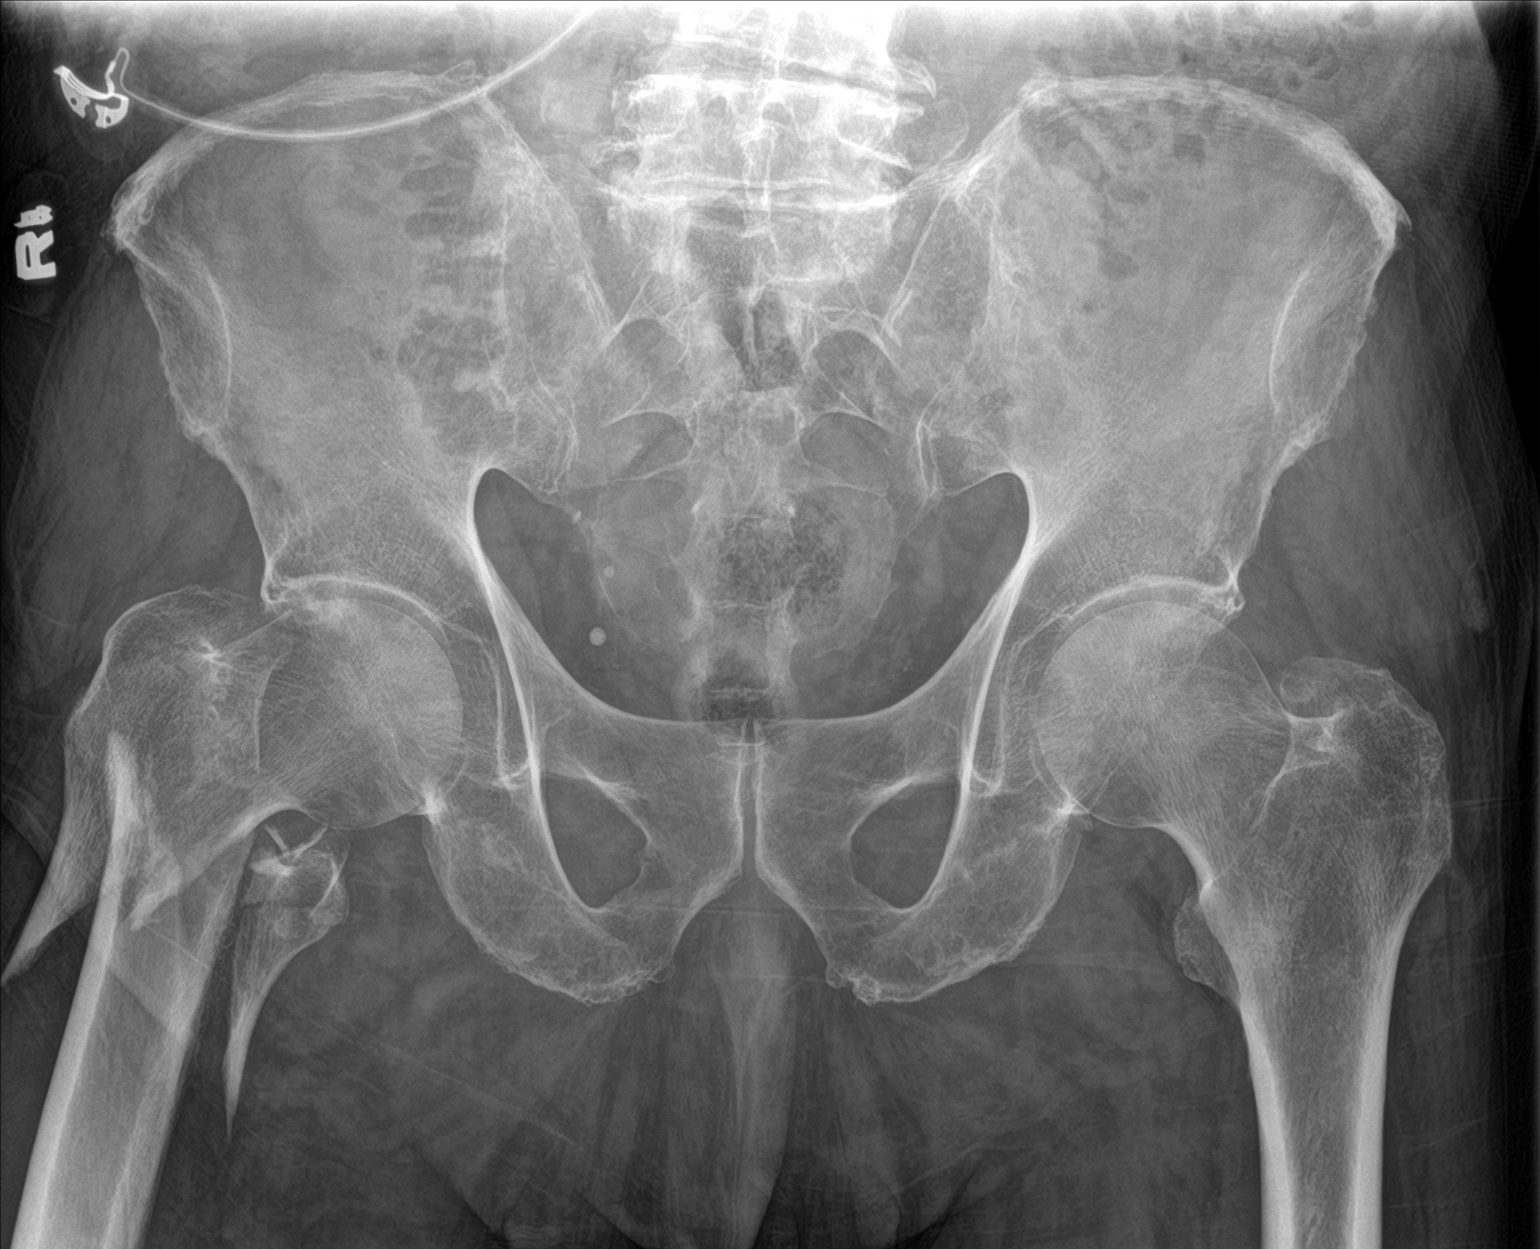

[hip ap]
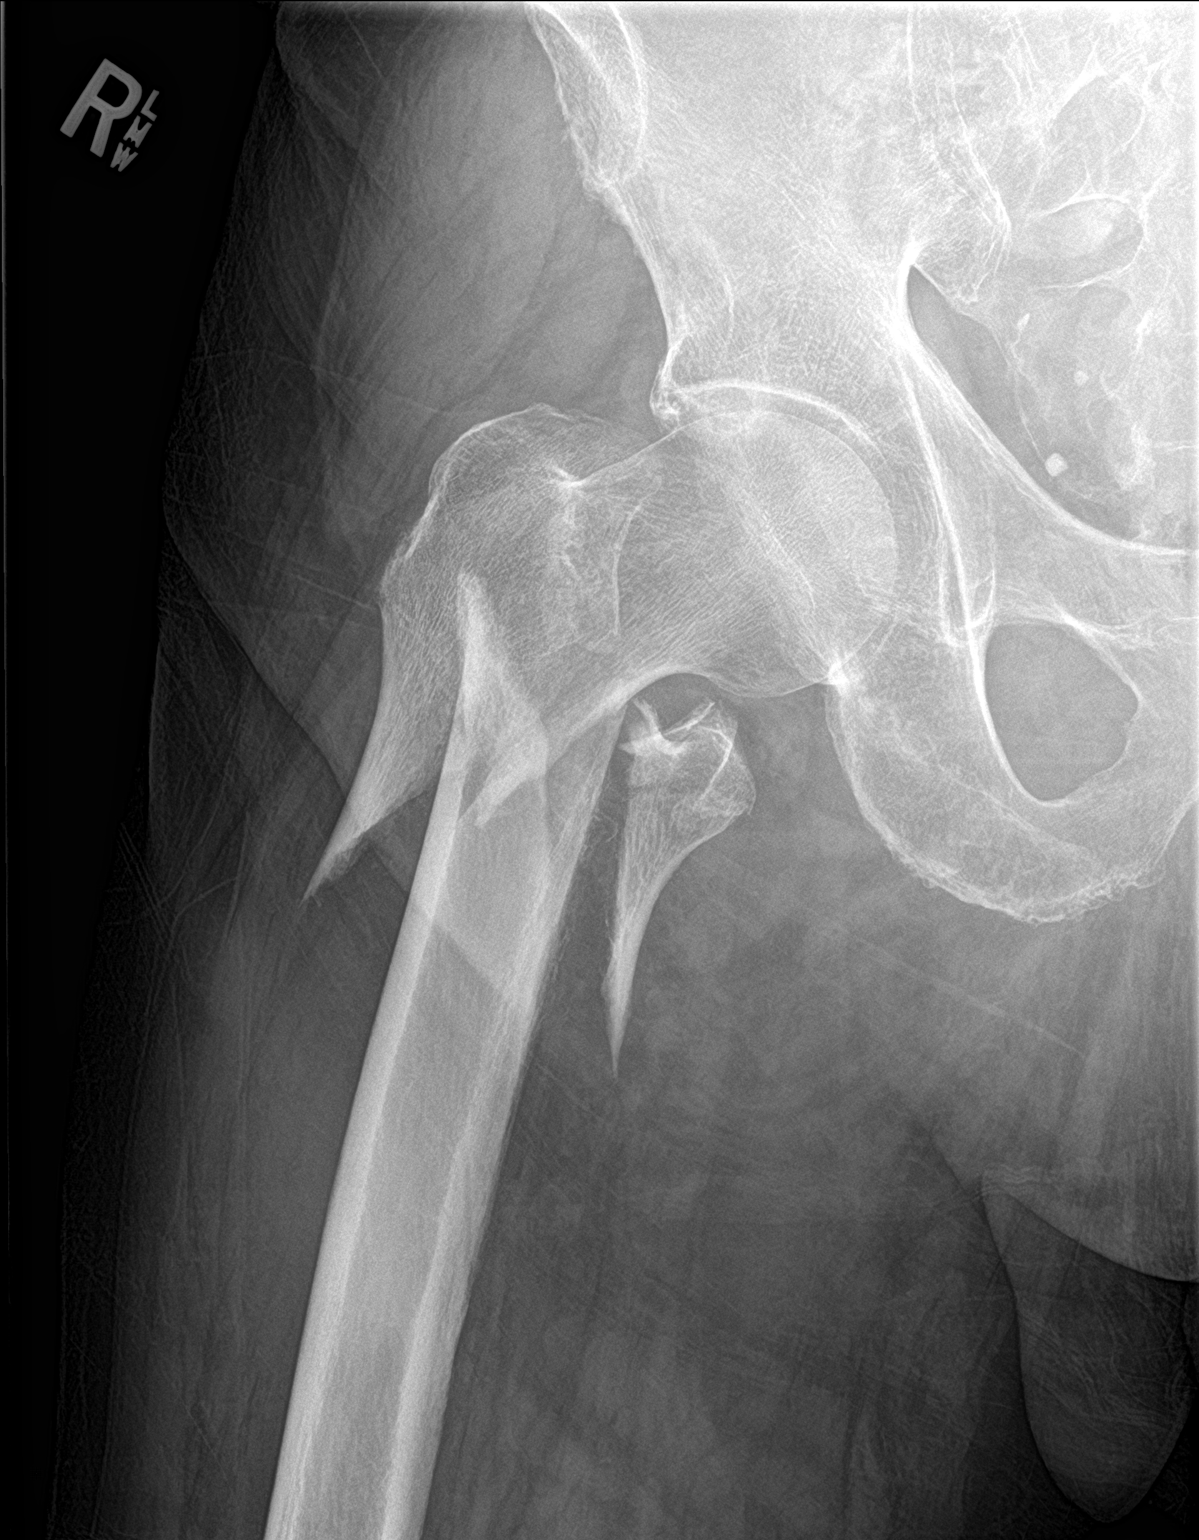

[hip lat]
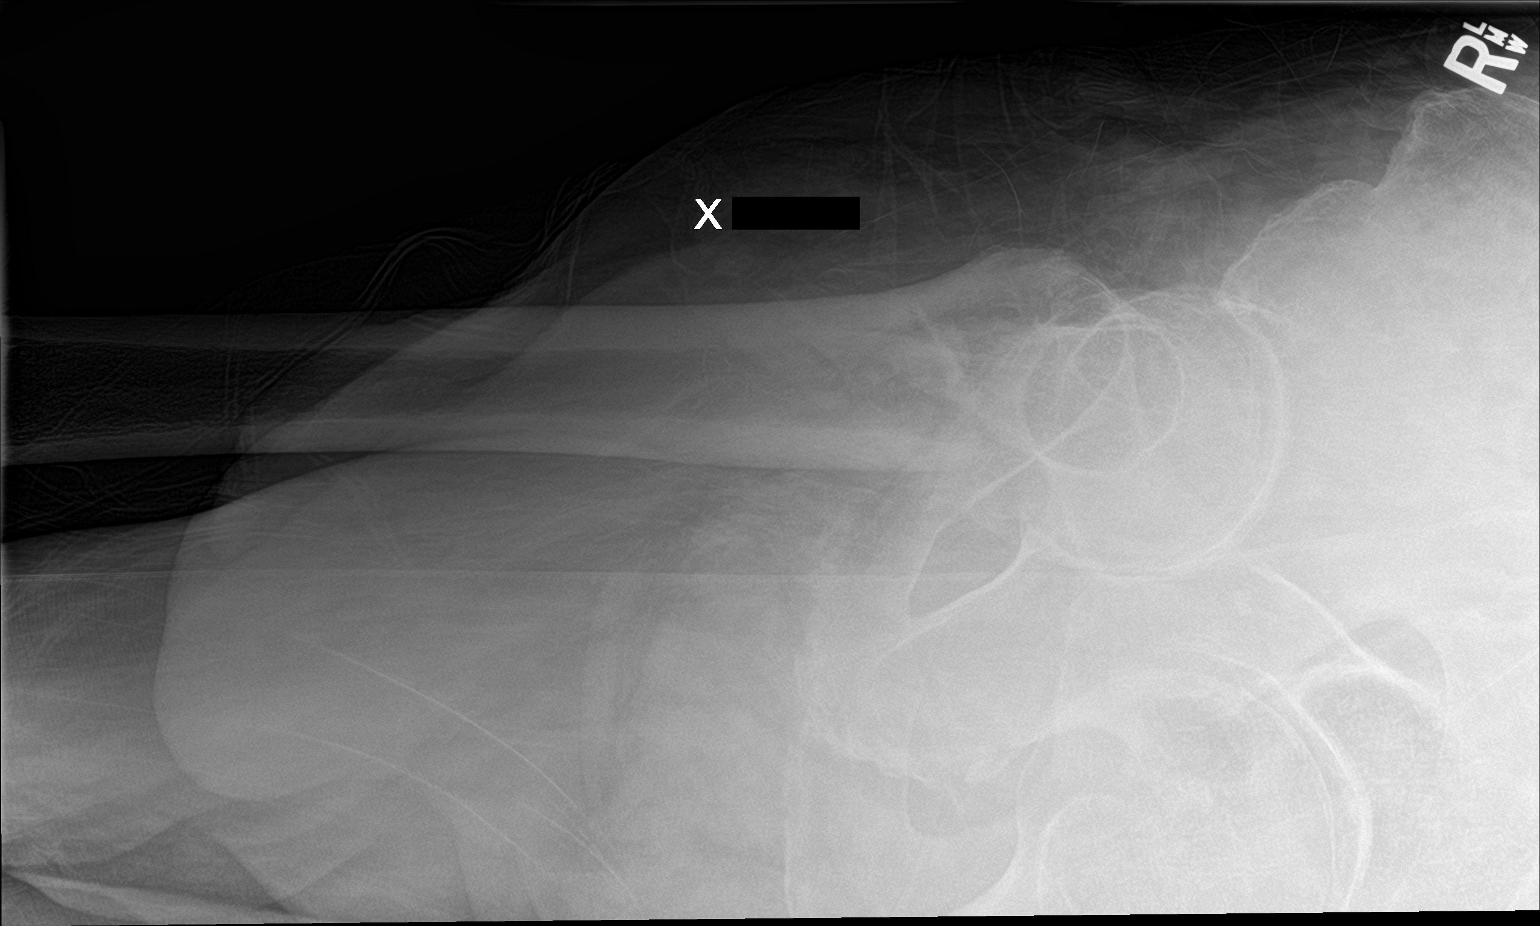

[3 of 3 positions shown; findings below may reference images not displayed]

FINDINGS: Comminuted displaced intertrochanteric right hip fracture. There is
mild apex lateral angulation of dominant fracture fragments.
Displacement involves the lesser trochanter. Femoral head remains
located. Pubic rami are intact. Pubic symphysis and sacroiliac
joints are congruent.
IMPRESSION: Comminuted displaced intertrochanteric right proximal femur
fracture.

## 2018-07-05 ENCOUNTER — Encounter: Payer: Self-pay | Admitting: Podiatry

## 2018-07-05 ENCOUNTER — Ambulatory Visit: Payer: Medicare Other | Admitting: Podiatry

## 2018-07-05 DIAGNOSIS — B351 Tinea unguium: Secondary | ICD-10-CM

## 2018-07-05 DIAGNOSIS — M79609 Pain in unspecified limb: Secondary | ICD-10-CM

## 2018-07-05 DIAGNOSIS — D689 Coagulation defect, unspecified: Secondary | ICD-10-CM | POA: Diagnosis not present

## 2018-07-05 NOTE — Progress Notes (Signed)
Complaint:  Visit Type: Patient returns to my office for continued preventative foot care services. Complaint: Patient states" my nails have grown long and thick and become painful to walk and wear shoes" Patient has been diagnosed with DM with no foot complications. The patient presents for preventative foot care services. No changes to ROS.  Patient is taking eliquiss.  Podiatric Exam: Vascular: dorsalis pedis and posterior tibial pulses are palpable bilateral. Capillary return is immediate. Temperature gradient is WNL. Skin turgor WNL  Sensorium: Diminished  Semmes Weinstein monofilament test. Normal tactile sensation bilaterally. Nail Exam: Pt has thick disfigured discolored nails with subungual debris noted bilateral entire nail hallux through fifth toenails Ulcer Exam: There is no evidence of ulcer or pre-ulcerative changes or infection. Orthopedic Exam: Muscle tone and strength are WNL. No limitations in general ROM. No crepitus or effusions noted. Foot type and digits show no abnormalities. Bony prominences are unremarkable. Skin: No Porokeratosis. No infection or ulcers  Diagnosis:  Onychomycosis, , Pain in right toe, pain in left toes  Treatment & Plan Procedures and Treatment: Consent by patient was obtained for treatment procedures. The patient understood the discussion of treatment and procedures well. All questions were answered thoroughly reviewed. Debridement of mycotic and hypertrophic toenails, 1 through 5 bilateral and clearing of subungual debris. No ulceration, no infection noted.  Return Visit-Office Procedure: Patient instructed to return to the office for a follow up visit 3 months for continued evaluation and treatment.    Britzy Graul DPM 

## 2018-07-14 LAB — CUP PACEART REMOTE DEVICE CHECK
Battery Remaining Longevity: 98 mo
Battery Remaining Percentage: 95.5 %
Brady Statistic AP VS Percent: 1 %
Brady Statistic AS VP Percent: 28 %
Brady Statistic AS VS Percent: 1 %
Date Time Interrogation Session: 20191015175345
Implantable Lead Implant Date: 20190412
Implantable Lead Location: 753860
Lead Channel Impedance Value: 460 Ohm
Lead Channel Pacing Threshold Amplitude: 0.75 V
Lead Channel Pacing Threshold Pulse Width: 0.5 ms
Lead Channel Sensing Intrinsic Amplitude: 2.6 mV
Lead Channel Sensing Intrinsic Amplitude: 6 mV
Lead Channel Setting Pacing Amplitude: 2 V
Lead Channel Setting Pacing Amplitude: 2.5 V
Lead Channel Setting Pacing Pulse Width: 0.5 ms
MDC IDC LEAD IMPLANT DT: 20190412
MDC IDC LEAD LOCATION: 753859
MDC IDC MSMT BATTERY VOLTAGE: 3.01 V
MDC IDC MSMT LEADCHNL RA PACING THRESHOLD AMPLITUDE: 0.75 V
MDC IDC MSMT LEADCHNL RA PACING THRESHOLD PULSEWIDTH: 0.5 ms
MDC IDC MSMT LEADCHNL RV IMPEDANCE VALUE: 450 Ohm
MDC IDC PG IMPLANT DT: 20190412
MDC IDC SET LEADCHNL RV SENSING SENSITIVITY: 2 mV
MDC IDC STAT BRADY AP VP PERCENT: 72 %
MDC IDC STAT BRADY RA PERCENT PACED: 72 %
MDC IDC STAT BRADY RV PERCENT PACED: 99 %
Pulse Gen Model: 2272
Pulse Gen Serial Number: 9009871

## 2018-08-07 ENCOUNTER — Ambulatory Visit (INDEPENDENT_AMBULATORY_CARE_PROVIDER_SITE_OTHER): Payer: Medicare Other

## 2018-08-07 DIAGNOSIS — I442 Atrioventricular block, complete: Secondary | ICD-10-CM | POA: Diagnosis not present

## 2018-08-08 NOTE — Progress Notes (Signed)
Remote pacemaker transmission.   

## 2018-08-11 LAB — CUP PACEART REMOTE DEVICE CHECK
Battery Remaining Longevity: 99 mo
Battery Voltage: 3.01 V
Brady Statistic AS VS Percent: 1 %
Brady Statistic RA Percent Paced: 69 %
Date Time Interrogation Session: 20200114075721
Implantable Lead Implant Date: 20190412
Implantable Lead Location: 753859
Implantable Lead Location: 753860
Lead Channel Impedance Value: 460 Ohm
Lead Channel Impedance Value: 530 Ohm
Lead Channel Pacing Threshold Amplitude: 0.75 V
Lead Channel Pacing Threshold Pulse Width: 0.5 ms
Lead Channel Setting Pacing Amplitude: 2 V
MDC IDC LEAD IMPLANT DT: 20190412
MDC IDC MSMT BATTERY REMAINING PERCENTAGE: 95.5 %
MDC IDC MSMT LEADCHNL RA SENSING INTR AMPL: 4 mV
MDC IDC MSMT LEADCHNL RV PACING THRESHOLD AMPLITUDE: 0.75 V
MDC IDC MSMT LEADCHNL RV PACING THRESHOLD PULSEWIDTH: 0.5 ms
MDC IDC MSMT LEADCHNL RV SENSING INTR AMPL: 6 mV
MDC IDC PG IMPLANT DT: 20190412
MDC IDC SET LEADCHNL RV PACING AMPLITUDE: 2.5 V
MDC IDC SET LEADCHNL RV PACING PULSEWIDTH: 0.5 ms
MDC IDC SET LEADCHNL RV SENSING SENSITIVITY: 2 mV
MDC IDC STAT BRADY AP VP PERCENT: 69 %
MDC IDC STAT BRADY AP VS PERCENT: 1 %
MDC IDC STAT BRADY AS VP PERCENT: 30 %
MDC IDC STAT BRADY RV PERCENT PACED: 99 %
Pulse Gen Model: 2272
Pulse Gen Serial Number: 9009871

## 2018-09-18 ENCOUNTER — Ambulatory Visit (INDEPENDENT_AMBULATORY_CARE_PROVIDER_SITE_OTHER): Payer: Medicare Other | Admitting: Internal Medicine

## 2018-09-18 ENCOUNTER — Encounter: Payer: Self-pay | Admitting: Internal Medicine

## 2018-09-18 VITALS — BP 128/64 | HR 60 | Wt 198.0 lb

## 2018-09-18 DIAGNOSIS — Z950 Presence of cardiac pacemaker: Secondary | ICD-10-CM

## 2018-09-18 DIAGNOSIS — Z79899 Other long term (current) drug therapy: Secondary | ICD-10-CM

## 2018-09-18 DIAGNOSIS — I480 Paroxysmal atrial fibrillation: Secondary | ICD-10-CM | POA: Diagnosis not present

## 2018-09-18 DIAGNOSIS — I442 Atrioventricular block, complete: Secondary | ICD-10-CM | POA: Diagnosis not present

## 2018-09-18 MED ORDER — ATENOLOL 25 MG PO TABS: 25.0000 mg | ORAL_TABLET | Freq: Every day | ORAL | 11 refills | Status: DC

## 2018-09-18 NOTE — Patient Instructions (Signed)
Medication Instructions:  - Your physician has recommended you make the following change in your medication:   1) DECREASE atenolol to 25 mg- take 1 tablet by mouth once daily  If you need a refill on your cardiac medications before your next appointment, please call your pharmacy.   Lab work: - Your physician recommends that you have lab work today : BMP/ CBC  If you have labs (blood work) drawn today and your tests are completely normal, you will receive your results only by: Marland Kitchen MyChart Message (if you have MyChart) OR . A paper copy in the mail If you have any lab test that is abnormal or we need to change your treatment, we will call you to review the results.  Testing/Procedures: - none ordered  Follow-Up: At [facility], you and your health needs are our priority.  As part of our continuing mission to provide you with exceptional heart care, we have created designated Provider Care Teams.  These Care Teams include your primary Cardiologist (physician) and Advanced Practice Providers (APPs -  Physician Assistants and Nurse Practitioners) who all work together to provide you with the care you need, when you need it. . You will need a follow up appointment in 1 year with Dr. Graciela Husbands.  Please call our office 2 months in advance to schedule this appointment.  .  Remote monitoring is used to monitor your Pacemaker of ICD from home. This monitoring reduces the number of office visits required to check your device to one time per year. It allows [place] to keep an eye on the functioning of your device to ensure it is working properly. You are scheduled for a device check from home on 11/06/2018. You may send your transmission at any time that day. If you have a wireless device, the transmission will be sent automatically. After your physician reviews your transmission, you will receive a postcard with your next transmission date.  Any Other Special Instructions Will Be Listed Below (If  Applicable). - N/A

## 2018-09-18 NOTE — Progress Notes (Signed)
Patient Care Team: Jaclyn Shaggy, MD as PCP - General (Internal Medicine) Antonieta Iba, MD as PCP - Cardiology (Cardiology)   HPI  Justin Hendrix is a 85 y.o. male Seen in follow-up for pacemaker [employer] Jude  implanted 4/19 for complete heart block.  He has a history of a TIA.  Was treated with Lovenox apparently.  Had atrial fibrillation detected on his device.  Now on apixoban.  Tolerating without bleeding    He he walks around the house with his walker.  He had syncope prior to his presentation with complete heart block 4/19.  There is another episode of syncope in June.  Unclear.  He fell on the deck.  Needed stitches.  No interval syncope   No chest pain or sob, scant edema  Biggest complaint is fatigue;  Records and Results Reviewed PCP aoffice records   DATE TEST EF   1/15 Echo   50-55 %   4/19 Echo   60-65 %         Date Cr K Hgb  4/19 1.69 4.3 8.7  7/19 1.21 4.1 14.1          Past Medical History:  Diagnosis Date  . Benign prostatic hypertrophy   . Chest pain    a. 04/2014 Echo: EF 50-55%, mild LVH, nl RV fxn, mild to mod AS, mild AI, mild TR; b. 04/2014 MV: nl EF. No ischemia.  . Chronic prostatitis   . Elevated PSA   . Incomplete bladder emptying   . Parkinson's disease (HCC)   . Pre-syncope   . TIA (transient ischemic attack)   . Unsteady gait   . Urinary frequency     Past Surgical History:  Procedure Laterality Date  . INTRAMEDULLARY (IM) NAIL INTERTROCHANTERIC Right 11/04/2017   Procedure: INTRAMEDULLARY (IM) NAIL INTERTROCHANTRIC;  Surgeon: Bjorn Pippin, MD;  Location: [facility] OR;  Service: Orthopedics;  Laterality: Right;  . PACEMAKER IMPLANT N/A 11/03/2017   Procedure: PACEMAKER IMPLANT;  Surgeon: Hillis Range, MD;  Location: [facility] INVASIVE CV LAB;  Service: Cardiovascular;  Laterality: N/A;    Current Meds  Medication Sig  . amantadine (SYMMETREL) 100 MG capsule Take 100 mg by mouth daily.  . Amantadine HCl 100 MG tablet   .  apixaban (ELIQUIS) 2.5 MG TABS tablet Take 1 tablet (2.5 mg total) by mouth 2 (two) times daily.  Marland Kitchen atenolol (TENORMIN) 50 MG tablet   . budesonide-formoterol (SYMBICORT) 160-4.5 MCG/ACT inhaler Inhale 2 puffs into the lungs 2 (two) times daily as needed (respiratory difficulty).   Marland Kitchen docusate sodium (COLACE) 100 MG capsule Take 1 capsule (100 mg total) by mouth 2 (two) times daily.  . finasteride (PROSCAR) 5 MG tablet Take 1 tablet (5 mg total) by mouth daily.  . fluticasone (FLONASE) 50 MCG/ACT nasal spray 2 sprays by Each Nare route daily. as needed  . ondansetron (ZOFRAN) 4 MG tablet Take 1 tablet (4 mg total) by mouth every 6 (six) hours as needed for nausea.  . pantoprazole (PROTONIX) 40 MG tablet Take 40 mg by mouth daily.  . polyethylene glycol (MIRALAX / GLYCOLAX) packet Take 17 g by mouth daily.  Marland Kitchen senna-docusate (SENOKOT-S) 8.6-50 MG tablet Take 1 tablet by mouth at bedtime.  . sertraline (ZOLOFT) 100 MG tablet Take 100 mg by mouth daily.   . tamsulosin (FLOMAX) 0.4 MG CAPS capsule Take 1 capsule (0.4 mg total) by mouth daily.    No Known Allergies    Review of  Systems negative except from HPI and PMH  Physical Exam BP 128/64 (BP Location: Left Arm, Patient Position: Sitting, Cuff Size: Normal)   Pulse 60   Wt 198 lb (89.8 kg)   BMI 28.41 kg/m  Well developed and well nourished in no acute distress HENT normal Neck supple with JVP-flat Clear Device pocket well healed; without hematoma or erythema.  There is no tethering  Regular rate and rhythm, no  gallop No  murmur Abd-soft with active BS No Clubbing cyanosis tr  edema Skin-warm and dry A & Oriented  Grossly normal sensory and motor function  ECG demonstrates AV pacing    Assessment and  Plan  Complete heart block  Falls-infrequent  TIA  Atrial fibrillation  Pacemaker-[employer] Jude The patient's device was interrogated.  The information was reviewed. No changes were made in the programming.     Weight  loss  BP low,  Reviewed PCP records  BP about 115 sys Will reduce atenolol 50>>25  On Anticoagulation;  No bleeding issues

## 2018-09-19 LAB — CBC WITH DIFFERENTIAL/PLATELET
Basophils Absolute: 0.1 10*3/uL (ref 0.0–0.2)
Basos: 2 %
EOS (ABSOLUTE): 0.3 10*3/uL (ref 0.0–0.4)
Eos: 4 %
Hematocrit: 40 % (ref 37.5–51.0)
Hemoglobin: 13.7 g/dL (ref 13.0–17.7)
Immature Grans (Abs): 0 10*3/uL (ref 0.0–0.1)
Immature Granulocytes: 0 %
Lymphocytes Absolute: 2.6 10*3/uL (ref 0.7–3.1)
Lymphs: 34 %
MCH: 29.4 pg (ref 26.6–33.0)
MCHC: 34.3 g/dL (ref 31.5–35.7)
MCV: 86 fL (ref 79–97)
MONOS ABS: 0.6 10*3/uL (ref 0.1–0.9)
Monocytes: 8 %
Neutrophils Absolute: 3.9 10*3/uL (ref 1.4–7.0)
Neutrophils: 52 %
Platelets: 279 10*3/uL (ref 150–450)
RBC: 4.66 x10E6/uL (ref 4.14–5.80)
RDW: 15.3 % (ref 11.6–15.4)
WBC: 7.7 10*3/uL (ref 3.4–10.8)

## 2018-09-19 LAB — BASIC METABOLIC PANEL
BUN/Creatinine Ratio: 10 (ref 10–24)
BUN: 13 mg/dL (ref 10–36)
CHLORIDE: 97 mmol/L (ref 96–106)
CO2: 22 mmol/L (ref 20–29)
Calcium: 9.1 mg/dL (ref 8.6–10.2)
Creatinine, Ser: 1.28 mg/dL — ABNORMAL HIGH (ref 0.76–1.27)
GFR calc Af Amer: 55 mL/min/{1.73_m2} — ABNORMAL LOW (ref >59)
GFR calc non Af Amer: 48 mL/min/{1.73_m2} — ABNORMAL LOW (ref >59)
Glucose: 94 mg/dL (ref 65–99)
Potassium: 4.6 mmol/L (ref 3.5–5.2)
Sodium: 135 mmol/L (ref 134–144)

## 2018-09-27 ENCOUNTER — Encounter: Payer: Self-pay | Admitting: Podiatry

## 2018-09-27 ENCOUNTER — Ambulatory Visit: Payer: Medicare Other | Admitting: Podiatry

## 2018-09-27 DIAGNOSIS — B351 Tinea unguium: Secondary | ICD-10-CM

## 2018-09-27 DIAGNOSIS — D689 Coagulation defect, unspecified: Secondary | ICD-10-CM | POA: Diagnosis not present

## 2018-09-27 DIAGNOSIS — M79609 Pain in unspecified limb: Secondary | ICD-10-CM | POA: Diagnosis not present

## 2018-09-27 NOTE — Progress Notes (Signed)
Complaint:  Visit Type: Patient returns to my office for continued preventative foot care services. Complaint: Patient states" my nails have grown long and thick and become painful to walk and wear shoes" Patient has been diagnosed with DM with no foot complications. The patient presents for preventative foot care services. No changes to ROS.  Patient is taking eliquiss.  Podiatric Exam: Vascular: dorsalis pedis and posterior tibial pulses are palpable bilateral. Capillary return is immediate. Temperature gradient is WNL. Skin turgor WNL  Sensorium: Diminished  Semmes Weinstein monofilament test. Normal tactile sensation bilaterally. Nail Exam: Pt has thick disfigured discolored nails with subungual debris noted bilateral entire nail hallux through fifth toenails Ulcer Exam: There is no evidence of ulcer or pre-ulcerative changes or infection. Orthopedic Exam: Muscle tone and strength are WNL. No limitations in general ROM. No crepitus or effusions noted. Foot type and digits show no abnormalities. Bony prominences are unremarkable. Skin: No Porokeratosis. No infection or ulcers  Diagnosis:  Onychomycosis, , Pain in right toe, pain in left toes  Treatment & Plan Procedures and Treatment: Consent by patient was obtained for treatment procedures. The patient understood the discussion of treatment and procedures well. All questions were answered thoroughly reviewed. Debridement of mycotic and hypertrophic toenails, 1 through 5 bilateral and clearing of subungual debris. No ulceration, no infection noted.  Return Visit-Office Procedure: Patient instructed to return to the office for a follow up visit 3 months for continued evaluation and treatment.    Helane Gunther DPM

## 2018-10-03 LAB — CUP PACEART INCLINIC DEVICE CHECK
Battery Remaining Longevity: 96 mo
Battery Voltage: 3.01 V
Brady Statistic RA Percent Paced: 69 %
Brady Statistic RV Percent Paced: 99.89 %
Date Time Interrogation Session: 20200225160658
Implantable Lead Implant Date: 20190412
Implantable Lead Implant Date: 20190412
Implantable Lead Location: 753859
Implantable Lead Location: 753860
Implantable Pulse Generator Implant Date: 20190412
Lead Channel Impedance Value: 475 Ohm
Lead Channel Impedance Value: 512.5 Ohm
Lead Channel Pacing Threshold Amplitude: 0.5 V
Lead Channel Pacing Threshold Amplitude: 0.75 V
Lead Channel Pacing Threshold Pulse Width: 0.5 ms
Lead Channel Pacing Threshold Pulse Width: 0.5 ms
Lead Channel Sensing Intrinsic Amplitude: 2.2 mV
Lead Channel Setting Pacing Amplitude: 2 V
Lead Channel Setting Pacing Amplitude: 2.5 V
Lead Channel Setting Sensing Sensitivity: 2 mV
MDC IDC MSMT LEADCHNL RV SENSING INTR AMPL: 8.8 mV
MDC IDC SET LEADCHNL RV PACING PULSEWIDTH: 0.5 ms
Pulse Gen Model: 2272
Pulse Gen Serial Number: 9009871

## 2018-11-06 ENCOUNTER — Ambulatory Visit (INDEPENDENT_AMBULATORY_CARE_PROVIDER_SITE_OTHER): Payer: Medicare Other | Admitting: *Deleted

## 2018-11-06 ENCOUNTER — Other Ambulatory Visit: Payer: Self-pay

## 2018-11-06 DIAGNOSIS — I442 Atrioventricular block, complete: Secondary | ICD-10-CM

## 2018-11-06 LAB — CUP PACEART REMOTE DEVICE CHECK
Battery Remaining Longevity: 104 mo
Battery Remaining Percentage: 95.5 %
Battery Voltage: 3.01 V
Brady Statistic AP VP Percent: 81 %
Brady Statistic AP VS Percent: 1 %
Brady Statistic AS VP Percent: 19 %
Brady Statistic AS VS Percent: 1 %
Brady Statistic RA Percent Paced: 81 %
Brady Statistic RV Percent Paced: 99 %
Date Time Interrogation Session: 20200414083905
Implantable Lead Implant Date: 20190412
Implantable Lead Implant Date: 20190412
Implantable Lead Location: 753859
Implantable Lead Location: 753860
Implantable Pulse Generator Implant Date: 20190412
Lead Channel Impedance Value: 460 Ohm
Lead Channel Impedance Value: 510 Ohm
Lead Channel Pacing Threshold Amplitude: 0.5 V
Lead Channel Pacing Threshold Amplitude: 0.75 V
Lead Channel Pacing Threshold Pulse Width: 0.5 ms
Lead Channel Pacing Threshold Pulse Width: 0.5 ms
Lead Channel Sensing Intrinsic Amplitude: 3.1 mV
Lead Channel Sensing Intrinsic Amplitude: 8.8 mV
Lead Channel Setting Pacing Amplitude: 2 V
Lead Channel Setting Pacing Amplitude: 2.5 V
Lead Channel Setting Pacing Pulse Width: 0.5 ms
Lead Channel Setting Sensing Sensitivity: 2 mV
Pulse Gen Model: 2272
Pulse Gen Serial Number: 9009871

## 2018-11-13 ENCOUNTER — Encounter: Payer: Self-pay | Admitting: Cardiology

## 2018-11-13 NOTE — Progress Notes (Signed)
Remote pacemaker transmission.   

## 2018-11-26 ENCOUNTER — Other Ambulatory Visit: Payer: Self-pay

## 2018-11-26 MED ORDER — TAMSULOSIN HCL 0.4 MG PO CAPS: 0.4000 mg | ORAL_CAPSULE | Freq: Every day | ORAL | 1 refills | Status: DC

## 2018-12-27 ENCOUNTER — Ambulatory Visit: Payer: Medicare Other | Admitting: Podiatry

## 2018-12-31 ENCOUNTER — Encounter: Payer: Self-pay | Admitting: Podiatry

## 2018-12-31 ENCOUNTER — Other Ambulatory Visit: Payer: Self-pay

## 2018-12-31 ENCOUNTER — Ambulatory Visit: Payer: Medicare Other | Admitting: Podiatry

## 2018-12-31 VITALS — Temp 97.9°F

## 2018-12-31 DIAGNOSIS — M79609 Pain in unspecified limb: Secondary | ICD-10-CM | POA: Diagnosis not present

## 2018-12-31 DIAGNOSIS — B351 Tinea unguium: Secondary | ICD-10-CM | POA: Diagnosis not present

## 2018-12-31 DIAGNOSIS — D689 Coagulation defect, unspecified: Secondary | ICD-10-CM | POA: Diagnosis not present

## 2018-12-31 NOTE — Progress Notes (Addendum)
Complaint:  Visit Type: Patient returns to my office for continued preventative foot care services. Complaint: Patient states" my nails have grown long and thick and become painful to walk and wear shoes"  The patient presents for preventative foot care services. No changes to ROS.  Patient is taking eliquiss.  Podiatric Exam: Vascular: dorsalis pedis and posterior tibial pulses are palpable bilateral. Capillary return is immediate. Temperature gradient is WNL. Skin turgor WNL  Sensorium: Diminished  Semmes Weinstein monofilament test. Normal tactile sensation bilaterally. Nail Exam: Pt has thick disfigured discolored nails with subungual debris noted bilateral entire nail hallux through fifth toenails Ulcer Exam: There is no evidence of ulcer or pre-ulcerative changes or infection. Orthopedic Exam: Muscle tone and strength are WNL. No limitations in general ROM. No crepitus or effusions noted. Foot type and digits show no abnormalities. Bony prominences are unremarkable. Skin: No Porokeratosis. No infection or ulcers  Diagnosis:  Onychomycosis, , Pain in right toe, pain in left toes  Treatment & Plan Procedures and Treatment: Consent by patient was obtained for treatment procedures. The patient understood the discussion of treatment and procedures well. All questions were answered thoroughly reviewed. Debridement of mycotic and hypertrophic toenails, 1 through 5 bilateral and clearing of subungual debris. No ulceration, no infection noted.  Return Visit-Office Procedure: Patient instructed to return to the office for a follow up visit 3 months for continued evaluation and treatment.    Gardiner Barefoot DPM

## 2019-02-05 ENCOUNTER — Ambulatory Visit (INDEPENDENT_AMBULATORY_CARE_PROVIDER_SITE_OTHER): Payer: Medicare Other | Admitting: *Deleted

## 2019-02-05 DIAGNOSIS — I442 Atrioventricular block, complete: Secondary | ICD-10-CM | POA: Diagnosis not present

## 2019-02-05 LAB — CUP PACEART REMOTE DEVICE CHECK
Date Time Interrogation Session: 20200714094008
Implantable Lead Implant Date: 20190412
Implantable Lead Implant Date: 20190412
Implantable Lead Location: 753859
Implantable Lead Location: 753860
Implantable Pulse Generator Implant Date: 20190412
Pulse Gen Model: 2272
Pulse Gen Serial Number: 9009871

## 2019-02-13 ENCOUNTER — Ambulatory Visit: Payer: Medicare Other | Admitting: Urology

## 2019-02-20 NOTE — Progress Notes (Signed)
Remote pacemaker transmission.   

## 2019-02-22 ENCOUNTER — Other Ambulatory Visit: Payer: Self-pay

## 2019-02-22 MED ORDER — TAMSULOSIN HCL 0.4 MG PO CAPS: 0.4000 mg | ORAL_CAPSULE | Freq: Every day | ORAL | 0 refills | Status: DC

## 2019-03-04 ENCOUNTER — Encounter: Payer: Self-pay | Admitting: Urology

## 2019-03-04 ENCOUNTER — Ambulatory Visit: Payer: Medicare Other | Admitting: Urology

## 2019-03-04 ENCOUNTER — Other Ambulatory Visit: Payer: Self-pay

## 2019-03-04 VITALS — BP 91/61 | HR 59

## 2019-03-04 DIAGNOSIS — N401 Enlarged prostate with lower urinary tract symptoms: Secondary | ICD-10-CM | POA: Diagnosis not present

## 2019-03-04 MED ORDER — FINASTERIDE 5 MG PO TABS: 5.0000 mg | ORAL_TABLET | Freq: Every day | ORAL | 3 refills | Status: DC

## 2019-03-04 MED ORDER — TAMSULOSIN HCL 0.4 MG PO CAPS: 0.4000 mg | ORAL_CAPSULE | Freq: Every day | ORAL | 3 refills | Status: DC

## 2019-03-04 NOTE — Progress Notes (Signed)
03/04/2019 1:26 PM   Justin Hendrix 01/14/1923 [id number]  Referring provider: Jaclyn Shaggyate, Denny C, MD [address]   [place] [id number]  Chief Complaint  Patient presents with  . Benign Prostatic Hypertrophy    Urologic history:  1.  BPH with lower urinary tract symptoms  -Combination therapy tamsulosin/finasteride   HPI: 85 y.o. male presents for annual follow-up of BPH.  Since his visit last year he states he has been doing well.  He has no bothersome lower urinary tract symptoms and remains on tamsulosin and finasteride.  IPSS completed today was 8/1.  Denies dysuria, gross hematuria.  He denies flank, abdominal, pelvic or scrotal pain.    PMH: Past Medical History:  Diagnosis Date  . Benign prostatic hypertrophy   . Chest pain    a. 04/2014 Echo: EF 50-55%, mild LVH, nl RV fxn, mild to mod AS, mild AI, mild TR; b. 04/2014 MV: nl EF. No ischemia.  . Chronic prostatitis   . Elevated PSA   . Incomplete bladder emptying   . Parkinson's disease (HCC)   . Pre-syncope   . TIA (transient ischemic attack)   . Unsteady gait   . Urinary frequency     Surgical History: Past Surgical History:  Procedure Laterality Date  . INTRAMEDULLARY (IM) NAIL INTERTROCHANTERIC Right 11/04/2017   Procedure: INTRAMEDULLARY (IM) NAIL INTERTROCHANTRIC;  Surgeon: Bjorn PippinVarkey, Dax T, MD;  Location: [facility];  Service: Orthopedics;  Laterality: Right;  . PACEMAKER IMPLANT N/A 11/03/2017   Procedure: PACEMAKER IMPLANT;  Surgeon: Hillis RangeAllred, James, MD;  Location: [facility] INVASIVE CV LAB;  Service: Cardiovascular;  Laterality: N/A;    Home Medications:  Allergies as of 03/04/2019   No Known Allergies     Medication List       Accurate as of March 04, 2019  1:26 PM. If you have any questions, ask your nurse or doctor.        amantadine 100 MG capsule Commonly known as: SYMMETREL Take 100 mg by mouth daily.   Amantadine HCl 100 MG tablet   apixaban 2.5 MG Tabs tablet Commonly known as:  ELIQUIS Take 1 tablet (2.5 mg total) by mouth 2 (two) times daily.   atenolol 25 MG tablet Commonly known as: TENORMIN Take 1 tablet (25 mg total) by mouth daily.   budesonide-formoterol 160-4.5 MCG/ACT inhaler Commonly known as: SYMBICORT Inhale 2 puffs into the lungs 2 (two) times daily as needed (respiratory difficulty).   docusate sodium 100 MG capsule Commonly known as: COLACE Take 1 capsule (100 mg total) by mouth 2 (two) times daily.   finasteride 5 MG tablet Commonly known as: PROSCAR Take 1 tablet (5 mg total) by mouth daily.   fluticasone 50 MCG/ACT nasal spray Commonly known as: FLONASE 2 sprays by Each Nare route daily. as needed   ondansetron 4 MG tablet Commonly known as: ZOFRAN Take 1 tablet (4 mg total) by mouth every 6 (six) hours as needed for nausea.   pantoprazole 40 MG tablet Commonly known as: PROTONIX Take 40 mg by mouth daily.   polyethylene glycol 17 g packet Commonly known as: MIRALAX / GLYCOLAX Take 17 g by mouth daily.   senna-docusate 8.6-50 MG tablet Commonly known as: Senokot-S Take 1 tablet by mouth at bedtime.   sertraline 100 MG tablet Commonly known as: ZOLOFT Take 100 mg by mouth daily.   tamsulosin 0.4 MG Caps capsule Commonly known as: FLOMAX Take 1 capsule (0.4 mg total) by mouth daily.  Allergies: No Known Allergies  Family History: Family History  Problem Relation Age of Onset  . Hypertension Father     Social History:  reports that he has quit smoking. His smokeless tobacco use includes chew. He reports that he does not drink alcohol or use drugs.  ROS: UROLOGY Frequent Urination?: No Hard to postpone urination?: No Burning/pain with urination?: No Get up at night to urinate?: No Leakage of urine?: No Urine stream starts and stops?: No Trouble starting stream?: No Do you have to strain to urinate?: No Blood in urine?: No Urinary tract infection?: No Sexually transmitted disease?: No Injury to  kidneys or bladder?: No Painful intercourse?: No Weak stream?: No Erection problems?: No Penile pain?: No  Gastrointestinal Nausea?: No Vomiting?: No Indigestion/heartburn?: No Diarrhea?: No Constipation?: No  Constitutional Fever: No Night sweats?: No Weight loss?: No Fatigue?: No  Skin Skin rash/lesions?: No Itching?: No  Eyes Blurred vision?: No Double vision?: No  Ears/Nose/Throat Sore throat?: No Sinus problems?: No  Hematologic/Lymphatic Swollen glands?: No Easy bruising?: No  Cardiovascular Leg swelling?: No Chest pain?: No  Respiratory Cough?: No Shortness of breath?: No  Endocrine Excessive thirst?: No  Musculoskeletal Back pain?: No Joint pain?: No  Neurological Headaches?: No Dizziness?: No  Psychologic Depression?: No Anxiety?: No  Physical Exam: BP 91/61 (BP Location: Left Arm, Patient Position: Sitting, Cuff Size: Normal)   Pulse (!) 59   Constitutional:  Alert and oriented, No acute distress. HEENT: [place] AT, moist mucus membranes.  Trachea midline, no masses. Cardiovascular: No clubbing, cyanosis, or edema. Respiratory: Normal respiratory effort, no increased work of breathing. GI: Abdomen is soft, nontender, nondistended, no abdominal masses GU: No CVA tenderness Skin: No rashes, bruises or suspicious lesions. Neurologic: Grossly intact, no focal deficits, moving all 4 extremities. Psychiatric: Normal mood and affect.   Assessment & Plan:    - BPH with lower urinary tract symptoms Stable voiding symptoms on tamsulosin and finasteride which were refilled.  He will continue annual follow-up.   Abbie Sons, [place] [address] [place], [place] [phone]

## 2019-03-23 ENCOUNTER — Other Ambulatory Visit: Payer: Self-pay | Admitting: Internal Medicine

## 2019-03-25 ENCOUNTER — Telehealth: Payer: Self-pay | Admitting: *Deleted

## 2019-03-25 NOTE — Telephone Encounter (Signed)
-----   Message from Leeroy Bock, [facility] sent at 03/25/2019  8:08 AM EDT ----- Regarding: Eliquis dosing Dr Caryl Comes,  We received a refill request for Mr Pulis for Eliquis 2.5mg  BID - his SCr has been less than 1.5 over the past year and his weight is > 80kg. He qualifies for 5mg  BID dosing - would you prefer [place] to increase his dose or keep him on 2.5mg  BID?  Thanks, [employer]

## 2019-03-25 NOTE — Telephone Encounter (Signed)
Message sent to Dr Caryl Comes regarding potential dose increase of Eliquis based on weight > 80kg and SCr < 1.5 over the past year.

## 2019-03-25 NOTE — Telephone Encounter (Signed)
megan your dosing is correct  Thanks SK

## 2019-03-25 NOTE — Telephone Encounter (Signed)
4m 89.8kg Scr 1.28 Lovw/klein 09/18/18 Pt requesting 2.5 but qualifies for 5mg  needs a pharmd review

## 2019-03-25 NOTE — Telephone Encounter (Signed)
To Dr. Klein to review. 

## 2019-03-26 MED ORDER — APIXABAN 5 MG PO TABS: 5.0000 mg | ORAL_TABLET | Freq: Two times a day (BID) | ORAL | 5 refills | Status: DC

## 2019-03-26 NOTE — Telephone Encounter (Signed)
Left message for pt to discuss increasing Eliquis dose from 2.5mg  BID to 5mg  BID.

## 2019-03-26 NOTE — Telephone Encounter (Signed)
Spoke with Dr Caryl Comes, ok for dose increase to 5mg  BID, pt's son is aware and refill sent in.

## 2019-03-26 NOTE — Telephone Encounter (Signed)
Spoke with patient's son who states pt has only been taking 2.5mg  once daily. He is in agreement with dose change to 5mg  and will make sure pt is taking Eliquis twice daily. Refill sent in.

## 2019-04-04 ENCOUNTER — Ambulatory Visit (INDEPENDENT_AMBULATORY_CARE_PROVIDER_SITE_OTHER): Payer: Medicare Other | Admitting: Podiatry

## 2019-04-04 ENCOUNTER — Encounter: Payer: Self-pay | Admitting: Podiatry

## 2019-04-04 ENCOUNTER — Other Ambulatory Visit: Payer: Self-pay

## 2019-04-04 DIAGNOSIS — M79676 Pain in unspecified toe(s): Secondary | ICD-10-CM | POA: Diagnosis not present

## 2019-04-04 DIAGNOSIS — D689 Coagulation defect, unspecified: Secondary | ICD-10-CM

## 2019-04-04 DIAGNOSIS — B351 Tinea unguium: Secondary | ICD-10-CM | POA: Diagnosis not present

## 2019-04-04 NOTE — Progress Notes (Signed)
Complaint:  Visit Type: Patient returns to my office for continued preventative foot care services. Complaint: Patient states" my nails have grown long and thick and become painful to walk and wear shoes"  The patient presents for preventative foot care services. No changes to ROS.  Patient is taking eliquiss.  Podiatric Exam: Vascular: dorsalis pedis and posterior tibial pulses are palpable bilateral. Capillary return is immediate. Temperature gradient is WNL. Skin turgor WNL  Sensorium: Diminished  Semmes Weinstein monofilament test. Normal tactile sensation bilaterally. Nail Exam: Pt has thick disfigured discolored nails with subungual debris noted bilateral entire nail hallux through fifth toenails Ulcer Exam: There is no evidence of ulcer or pre-ulcerative changes or infection. Orthopedic Exam: Muscle tone and strength are WNL. No limitations in general ROM. No crepitus or effusions noted. Foot type and digits show no abnormalities. Bony prominences are unremarkable. Skin: No Porokeratosis. No infection or ulcers  Diagnosis:  Onychomycosis, , Pain in right toe, pain in left toes  Treatment & Plan Procedures and Treatment: Consent by patient was obtained for treatment procedures. The patient understood the discussion of treatment and procedures well. All questions were answered thoroughly reviewed. Debridement of mycotic and hypertrophic toenails, 1 through 5 bilateral and clearing of subungual debris. No ulceration, no infection noted.  Return Visit-Office Procedure: Patient instructed to return to the office for a follow up visit 3 months for continued evaluation and treatment.    Journe Hallmark DPM 

## 2019-05-08 ENCOUNTER — Ambulatory Visit (INDEPENDENT_AMBULATORY_CARE_PROVIDER_SITE_OTHER): Payer: Medicare Other | Admitting: *Deleted

## 2019-05-08 DIAGNOSIS — I442 Atrioventricular block, complete: Secondary | ICD-10-CM

## 2019-05-08 LAB — CUP PACEART REMOTE DEVICE CHECK
Battery Remaining Longevity: 101 mo
Battery Remaining Percentage: 95.5 %
Battery Voltage: 2.99 V
Brady Statistic AP VP Percent: 84 %
Brady Statistic AP VS Percent: 1 %
Brady Statistic AS VP Percent: 16 %
Brady Statistic AS VS Percent: 1 %
Brady Statistic RA Percent Paced: 83 %
Brady Statistic RV Percent Paced: 99 %
Date Time Interrogation Session: 20201013060014
Implantable Lead Implant Date: 20190412
Implantable Lead Implant Date: 20190412
Implantable Lead Location: 753859
Implantable Lead Location: 753860
Implantable Pulse Generator Implant Date: 20190412
Lead Channel Impedance Value: 410 Ohm
Lead Channel Impedance Value: 490 Ohm
Lead Channel Pacing Threshold Amplitude: 0.5 V
Lead Channel Pacing Threshold Amplitude: 0.75 V
Lead Channel Pacing Threshold Pulse Width: 0.5 ms
Lead Channel Pacing Threshold Pulse Width: 0.5 ms
Lead Channel Sensing Intrinsic Amplitude: 4.1 mV
Lead Channel Sensing Intrinsic Amplitude: 4.2 mV
Lead Channel Setting Pacing Amplitude: 2 V
Lead Channel Setting Pacing Amplitude: 2.5 V
Lead Channel Setting Pacing Pulse Width: 0.5 ms
Lead Channel Setting Sensing Sensitivity: 2 mV
Pulse Gen Model: 2272
Pulse Gen Serial Number: 9009871

## 2019-05-20 NOTE — Progress Notes (Signed)
Remote pacemaker transmission.   

## 2019-07-04 ENCOUNTER — Encounter: Payer: Self-pay | Admitting: Podiatry

## 2019-07-04 ENCOUNTER — Ambulatory Visit: Payer: Medicare Other | Admitting: Podiatry

## 2019-07-04 ENCOUNTER — Other Ambulatory Visit: Payer: Self-pay

## 2019-07-04 DIAGNOSIS — D689 Coagulation defect, unspecified: Secondary | ICD-10-CM | POA: Diagnosis not present

## 2019-07-04 DIAGNOSIS — M79676 Pain in unspecified toe(s): Secondary | ICD-10-CM | POA: Diagnosis not present

## 2019-07-04 DIAGNOSIS — B351 Tinea unguium: Secondary | ICD-10-CM

## 2019-07-04 DIAGNOSIS — M79609 Pain in unspecified limb: Secondary | ICD-10-CM

## 2019-07-04 NOTE — Progress Notes (Signed)
Complaint:  Visit Type: Patient returns to my office for continued preventative foot care services. Complaint: Patient states" my nails have grown long and thick and become painful to walk and wear shoes"  The patient presents for preventative foot care services. No changes to ROS.  Patient is taking eliquiss.  Podiatric Exam: Vascular: dorsalis pedis and posterior tibial pulses are palpable bilateral. Capillary return is immediate. Temperature gradient is WNL. Skin turgor WNL  Sensorium: Diminished  Semmes Weinstein monofilament test. Normal tactile sensation bilaterally. Nail Exam: Pt has thick disfigured discolored nails with subungual debris noted bilateral entire nail hallux through fifth toenails Ulcer Exam: There is no evidence of ulcer or pre-ulcerative changes or infection. Orthopedic Exam: Muscle tone and strength are WNL. No limitations in general ROM. No crepitus or effusions noted. Foot type and digits show no abnormalities. Bony prominences are unremarkable. Skin: No Porokeratosis. No infection or ulcers  Diagnosis:  Onychomycosis, , Pain in right toe, pain in left toes  Treatment & Plan Procedures and Treatment: Consent by patient was obtained for treatment procedures. The patient understood the discussion of treatment and procedures well. All questions were answered thoroughly reviewed. Debridement of mycotic and hypertrophic toenails, 1 through 5 bilateral and clearing of subungual debris. No ulceration, no infection noted.  Return Visit-Office Procedure: Patient instructed to return to the office for a follow up visit 3 months for continued evaluation and treatment.    Gardiner Barefoot DPM

## 2019-08-07 ENCOUNTER — Ambulatory Visit (INDEPENDENT_AMBULATORY_CARE_PROVIDER_SITE_OTHER): Payer: Medicare PPO | Admitting: *Deleted

## 2019-08-07 DIAGNOSIS — I442 Atrioventricular block, complete: Secondary | ICD-10-CM

## 2019-08-07 LAB — CUP PACEART REMOTE DEVICE CHECK
Date Time Interrogation Session: 20210112100849
Implantable Lead Implant Date: 20190412
Implantable Lead Implant Date: 20190412
Implantable Lead Location: 753859
Implantable Lead Location: 753860
Implantable Pulse Generator Implant Date: 20190412
Pulse Gen Model: 2272
Pulse Gen Serial Number: 9009871

## 2019-08-31 ENCOUNTER — Other Ambulatory Visit: Payer: Self-pay | Admitting: Internal Medicine

## 2019-09-02 NOTE — Telephone Encounter (Signed)
Prescription refill request for Eliquis received.  Last office visit: 09/18/2018 Scr: 1.28, 09/18/2018 Age: 86 y.o. Weight: 89.8 kg   Prescription refill sent.

## 2019-09-04 ENCOUNTER — Other Ambulatory Visit: Payer: Self-pay | Admitting: *Deleted

## 2019-09-04 DIAGNOSIS — N401 Enlarged prostate with lower urinary tract symptoms: Secondary | ICD-10-CM

## 2019-09-04 MED ORDER — FINASTERIDE 5 MG PO TABS: 5.0000 mg | ORAL_TABLET | Freq: Every day | ORAL | 3 refills | Status: DC

## 2019-10-03 ENCOUNTER — Encounter: Payer: Self-pay | Admitting: Podiatry

## 2019-10-03 ENCOUNTER — Ambulatory Visit (INDEPENDENT_AMBULATORY_CARE_PROVIDER_SITE_OTHER): Payer: Medicare PPO | Admitting: Podiatry

## 2019-10-03 ENCOUNTER — Other Ambulatory Visit: Payer: Self-pay

## 2019-10-03 VITALS — Temp 95.4°F

## 2019-10-03 DIAGNOSIS — B351 Tinea unguium: Secondary | ICD-10-CM

## 2019-10-03 DIAGNOSIS — M79676 Pain in unspecified toe(s): Secondary | ICD-10-CM

## 2019-10-03 DIAGNOSIS — D689 Coagulation defect, unspecified: Secondary | ICD-10-CM | POA: Diagnosis not present

## 2019-10-03 NOTE — Progress Notes (Signed)
This patient returns to my office for at risk foot care.  This patient requires this care by a professional since this patient will be at risk due to having  Coagulation defect.This patient is unable to cut nails themselves since the patient cannot reach their nails.These nails are painful walking and wearing shoes.  This patient presents for at risk foot care today. ° °General Appearance  Alert, conversant and in no acute stress. ° °Vascular  Dorsalis pedis and posterior tibial  pulses are weakly  palpable  bilaterally.  Capillary return is within normal limits  bilaterally. Temperature is within normal limits  bilaterally. ° °Neurologic  Senn-Weinstein monofilament wire test diminished   bilaterally. Muscle power within normal limits bilaterally. ° °Nails Thick disfigured discolored nails with subungual debris  from hallux to fifth toes bilaterally. No evidence of bacterial infection or drainage bilaterally. ° °Orthopedic  No limitations of motion  feet .  No crepitus or effusions noted.  No bony pathology or digital deformities noted. ° °Skin  normotropic skin with no porokeratosis noted bilaterally.  No signs of infections or ulcers noted.    ° °Onychomycosis  Pain in right toes  Pain in left toes ° °Consent was obtained for treatment procedures.   Mechanical debridement of nails 1-5  bilaterally performed with a nail nipper.  Filed with dremel without incident. No infection or ulcer.   ° ° °Return office visit  3 months        Told patient to return for periodic foot care and evaluation due to potential at risk complications. ° ° °Elius Etheredge DPM  °

## 2019-11-06 ENCOUNTER — Ambulatory Visit (INDEPENDENT_AMBULATORY_CARE_PROVIDER_SITE_OTHER): Payer: Medicare PPO | Admitting: *Deleted

## 2019-11-06 DIAGNOSIS — I442 Atrioventricular block, complete: Secondary | ICD-10-CM

## 2019-11-06 LAB — CUP PACEART REMOTE DEVICE CHECK
Battery Remaining Longevity: 104 mo
Battery Remaining Percentage: 95.5 %
Battery Voltage: 2.99 V
Brady Statistic AP VP Percent: 78 %
Brady Statistic AP VS Percent: 1 %
Brady Statistic AS VP Percent: 21 %
Brady Statistic AS VS Percent: 1 %
Brady Statistic RA Percent Paced: 77 %
Brady Statistic RV Percent Paced: 99 %
Date Time Interrogation Session: 20210414032900
Implantable Lead Implant Date: 20190412
Implantable Lead Implant Date: 20190412
Implantable Lead Location: 753859
Implantable Lead Location: 753860
Implantable Pulse Generator Implant Date: 20190412
Lead Channel Impedance Value: 440 Ohm
Lead Channel Impedance Value: 510 Ohm
Lead Channel Pacing Threshold Amplitude: 0.5 V
Lead Channel Pacing Threshold Amplitude: 0.75 V
Lead Channel Pacing Threshold Pulse Width: 0.5 ms
Lead Channel Pacing Threshold Pulse Width: 0.5 ms
Lead Channel Sensing Intrinsic Amplitude: 3.4 mV
Lead Channel Sensing Intrinsic Amplitude: 5.6 mV
Lead Channel Setting Pacing Amplitude: 2 V
Lead Channel Setting Pacing Amplitude: 2.5 V
Lead Channel Setting Pacing Pulse Width: 0.5 ms
Lead Channel Setting Sensing Sensitivity: 2 mV
Pulse Gen Model: 2272
Pulse Gen Serial Number: 9009871

## 2019-11-06 NOTE — Progress Notes (Signed)
PPM Remote  

## 2020-01-06 ENCOUNTER — Ambulatory Visit: Payer: Medicare PPO | Admitting: Podiatry

## 2020-01-09 ENCOUNTER — Ambulatory Visit (INDEPENDENT_AMBULATORY_CARE_PROVIDER_SITE_OTHER): Payer: Medicare PPO | Admitting: Podiatry

## 2020-01-09 ENCOUNTER — Other Ambulatory Visit: Payer: Self-pay

## 2020-01-09 ENCOUNTER — Encounter: Payer: Self-pay | Admitting: Podiatry

## 2020-01-09 DIAGNOSIS — M79676 Pain in unspecified toe(s): Secondary | ICD-10-CM | POA: Diagnosis not present

## 2020-01-09 DIAGNOSIS — M79609 Pain in unspecified limb: Secondary | ICD-10-CM

## 2020-01-09 DIAGNOSIS — B351 Tinea unguium: Secondary | ICD-10-CM

## 2020-01-09 NOTE — Progress Notes (Signed)
This patient returns to my office for at risk foot care.  This patient requires this care by a professional since this patient will be at risk due to having  Coagulation defect.This patient is unable to cut nails themselves since the patient cannot reach their nails.These nails are painful walking and wearing shoes.  This patient presents for at risk foot care today. ° °General Appearance  Alert, conversant and in no acute stress. ° °Vascular  Dorsalis pedis and posterior tibial  pulses are weakly  palpable  bilaterally.  Capillary return is within normal limits  bilaterally. Temperature is within normal limits  bilaterally. ° °Neurologic  Senn-Weinstein monofilament wire test diminished   bilaterally. Muscle power within normal limits bilaterally. ° °Nails Thick disfigured discolored nails with subungual debris  from hallux to fifth toes bilaterally. No evidence of bacterial infection or drainage bilaterally. ° °Orthopedic  No limitations of motion  feet .  No crepitus or effusions noted.  No bony pathology or digital deformities noted. ° °Skin  normotropic skin with no porokeratosis noted bilaterally.  No signs of infections or ulcers noted.    ° °Onychomycosis  Pain in right toes  Pain in left toes ° °Consent was obtained for treatment procedures.   Mechanical debridement of nails 1-5  bilaterally performed with a nail nipper.  Filed with dremel without incident. No infection or ulcer.   ° ° °Return office visit  3 months        Told patient to return for periodic foot care and evaluation due to potential at risk complications. ° ° °Fronnie Urton DPM  °

## 2020-02-05 ENCOUNTER — Ambulatory Visit (INDEPENDENT_AMBULATORY_CARE_PROVIDER_SITE_OTHER): Payer: Medicare PPO | Admitting: *Deleted

## 2020-02-05 DIAGNOSIS — I442 Atrioventricular block, complete: Secondary | ICD-10-CM

## 2020-02-06 LAB — CUP PACEART REMOTE DEVICE CHECK
Battery Remaining Longevity: 100 mo
Battery Remaining Percentage: 95.5 %
Battery Voltage: 2.99 V
Brady Statistic AP VP Percent: 79 %
Brady Statistic AP VS Percent: 1 %
Brady Statistic AS VP Percent: 20 %
Brady Statistic AS VS Percent: 1 %
Brady Statistic RA Percent Paced: 77 %
Brady Statistic RV Percent Paced: 99 %
Date Time Interrogation Session: 20210714021031
Implantable Lead Implant Date: 20190412
Implantable Lead Implant Date: 20190412
Implantable Lead Location: 753859
Implantable Lead Location: 753860
Implantable Pulse Generator Implant Date: 20190412
Lead Channel Impedance Value: 410 Ohm
Lead Channel Impedance Value: 460 Ohm
Lead Channel Pacing Threshold Amplitude: 0.5 V
Lead Channel Pacing Threshold Amplitude: 0.75 V
Lead Channel Pacing Threshold Pulse Width: 0.5 ms
Lead Channel Pacing Threshold Pulse Width: 0.5 ms
Lead Channel Sensing Intrinsic Amplitude: 2.1 mV
Lead Channel Sensing Intrinsic Amplitude: 9 mV
Lead Channel Setting Pacing Amplitude: 2 V
Lead Channel Setting Pacing Amplitude: 2.5 V
Lead Channel Setting Pacing Pulse Width: 0.5 ms
Lead Channel Setting Sensing Sensitivity: 2 mV
Pulse Gen Model: 2272
Pulse Gen Serial Number: 9009871

## 2020-02-06 NOTE — Progress Notes (Signed)
Remote pacemaker transmission.   

## 2020-03-05 ENCOUNTER — Encounter: Payer: Self-pay | Admitting: Urology

## 2020-03-05 ENCOUNTER — Ambulatory Visit (INDEPENDENT_AMBULATORY_CARE_PROVIDER_SITE_OTHER): Payer: Medicare PPO | Admitting: Urology

## 2020-03-05 ENCOUNTER — Other Ambulatory Visit: Payer: Self-pay

## 2020-03-05 ENCOUNTER — Other Ambulatory Visit: Payer: Self-pay | Admitting: Internal Medicine

## 2020-03-05 VITALS — BP 95/60 | HR 58 | Temp 98.0°F | Ht 72.0 in | Wt 198.0 lb

## 2020-03-05 DIAGNOSIS — N401 Enlarged prostate with lower urinary tract symptoms: Secondary | ICD-10-CM

## 2020-03-05 DIAGNOSIS — I480 Paroxysmal atrial fibrillation: Secondary | ICD-10-CM

## 2020-03-05 LAB — BLADDER SCAN AMB NON-IMAGING: Scan Result: 21

## 2020-03-05 MED ORDER — FINASTERIDE 5 MG PO TABS: 5.0000 mg | ORAL_TABLET | Freq: Every day | ORAL | 1 refills | Status: DC

## 2020-03-05 MED ORDER — TAMSULOSIN HCL 0.4 MG PO CAPS: 0.4000 mg | ORAL_CAPSULE | Freq: Every day | ORAL | 3 refills | Status: DC

## 2020-03-05 NOTE — Progress Notes (Signed)
03/05/2020 2:05 PM   Justin Hendrix 1923-04-13 [id number]  Referring provider: Jaclyn Shaggy, MD [address]   [place],  [place] [address]  Chief Complaint  Patient presents with  . Benign Prostatic Hypertrophy     Urologic history:  1.  BPH with lower urinary tract symptoms             -Combination therapy tamsulosin/finasteride   HPI: 86 y.o. male presents for annual follow-up.   No bothersome LUTS  Stable symptoms on tamsulosin/finasteride  Denies flank, abdominal, pelvic pain  Denies dysuria, gross hematuria  IPSS 11/35     PMH: Past Medical History:  Diagnosis Date  . Benign prostatic hypertrophy   . Chest pain    a. 04/2014 Echo: EF 50-55%, mild LVH, nl RV fxn, mild to mod AS, mild AI, mild TR; b. 04/2014 MV: nl EF. No ischemia.  . Chronic prostatitis   . Elevated PSA   . Incomplete bladder emptying   . Parkinson's disease (HCC)   . Pre-syncope   . TIA (transient ischemic attack)   . Unsteady gait   . Urinary frequency     Surgical History: Past Surgical History:  Procedure Laterality Date  . INTRAMEDULLARY (IM) NAIL INTERTROCHANTERIC Right 11/04/2017   Procedure: INTRAMEDULLARY (IM) NAIL INTERTROCHANTRIC;  Surgeon: Bjorn Pippin, MD;  Location: [facility];  Service: Orthopedics;  Laterality: Right;  . PACEMAKER IMPLANT N/A 11/03/2017   Procedure: PACEMAKER IMPLANT;  Surgeon: Hillis Range, MD;  Location: [facility];  Service: Cardiovascular;  Laterality: N/A;    Home Medications:  Allergies as of 03/05/2020   No Known Allergies     Medication List       Accurate as of March 05, 2020  2:05 PM. If you have any questions, ask your nurse or doctor.        amantadine 100 MG capsule Commonly known as: SYMMETREL Take 100 mg by mouth daily.   Amantadine HCl 100 MG tablet   aspirin 81 MG EC tablet Take by mouth.   atenolol 50 MG tablet Commonly known as: TENORMIN   budesonide-formoterol 160-4.5 MCG/ACT inhaler Commonly  known as: SYMBICORT Inhale 2 puffs into the lungs 2 (two) times daily as needed (respiratory difficulty).   docusate sodium 100 MG capsule Commonly known as: COLACE Take 1 capsule (100 mg total) by mouth 2 (two) times daily.   Eliquis 5 MG Tabs tablet Generic drug: apixaban TAKE 1 TABLET BY MOUTH TWICE DAILY   finasteride 5 MG tablet Commonly known as: PROSCAR Take 1 tablet (5 mg total) by mouth daily.   fluticasone 50 MCG/ACT nasal spray Commonly known as: FLONASE 2 sprays by Each Nare route daily. as needed   ondansetron 4 MG tablet Commonly known as: ZOFRAN Take 1 tablet (4 mg total) by mouth every 6 (six) hours as needed for nausea.   pantoprazole 40 MG tablet Commonly known as: PROTONIX Take 40 mg by mouth daily.   polyethylene glycol 17 g packet Commonly known as: MIRALAX / GLYCOLAX Take 17 g by mouth daily.   senna-docusate 8.6-50 MG tablet Commonly known as: Senokot-S Take 1 tablet by mouth at bedtime.   sertraline 100 MG tablet Commonly known as: ZOLOFT Take 100 mg by mouth daily.   tamsulosin 0.4 MG Caps capsule Commonly known as: FLOMAX Take by mouth.       Allergies: No Known Allergies  Family History: Family History  Problem Relation Age of Onset  . Hypertension Father  Social History:  reports that he has quit smoking. His smokeless tobacco use includes chew. He reports that he does not drink alcohol and does not use drugs.   Physical Exam: BP 95/60   Pulse (!) 58   Temp 98 F (36.7 C)   Ht 6' (1.829 m)   Wt 198 lb (89.8 kg)   SpO2 95%   BMI 26.85 kg/m   Constitutional:  Alert and oriented, No acute distress. HEENT: [place] AT, moist mucus membranes.  Trachea midline, no masses. Cardiovascular: No clubbing, cyanosis, or edema. Respiratory: Normal respiratory effort, no increased work of breathing. Skin: No rashes, bruises or suspicious lesions. Neurologic: Grossly intact, no focal deficits, moving all 4 extremities. Psychiatric:  Normal mood and affect.   Assessment & Plan:    1. Benign prostatic hyperplasia with LUTS   Stable voiding symptoms on combination therapy  Tamsulosin/finasteride refilled  PVR by bladder scan 21 mL  Continue annual follow-up   [name], MD  [facility] [address] [place], [place] [address] [phone]

## 2020-03-05 NOTE — Telephone Encounter (Signed)
Please review for refill on Eliquis.  Thanks  

## 2020-03-05 NOTE — Telephone Encounter (Signed)
This is a [facility] pt 

## 2020-04-04 ENCOUNTER — Other Ambulatory Visit: Payer: Self-pay | Admitting: Internal Medicine

## 2020-04-06 NOTE — Telephone Encounter (Signed)
Pt overdue to see Dr. Graciela Husbands - last ov 09/18/18. Routing to scheduling.

## 2020-04-16 ENCOUNTER — Ambulatory Visit: Payer: Medicare PPO | Admitting: Podiatry

## 2020-04-16 ENCOUNTER — Other Ambulatory Visit: Payer: Self-pay

## 2020-04-16 ENCOUNTER — Encounter: Payer: Self-pay | Admitting: Podiatry

## 2020-04-16 DIAGNOSIS — M79609 Pain in unspecified limb: Secondary | ICD-10-CM

## 2020-04-16 DIAGNOSIS — M79676 Pain in unspecified toe(s): Secondary | ICD-10-CM | POA: Diagnosis not present

## 2020-04-16 DIAGNOSIS — D689 Coagulation defect, unspecified: Secondary | ICD-10-CM | POA: Diagnosis not present

## 2020-04-16 DIAGNOSIS — B351 Tinea unguium: Secondary | ICD-10-CM | POA: Diagnosis not present

## 2020-04-16 NOTE — Progress Notes (Signed)
This patient returns to my office for at risk foot care.  This patient requires this care by a professional since this patient will be at risk due to having  Coagulation defect.This patient is unable to cut nails themselves since the patient cannot reach their nails.These nails are painful walking and wearing shoes.  This patient presents for at risk foot care today. ° °General Appearance  Alert, conversant and in no acute stress. ° °Vascular  Dorsalis pedis and posterior tibial  pulses are weakly  palpable  bilaterally.  Capillary return is within normal limits  bilaterally. Temperature is within normal limits  bilaterally. ° °Neurologic  Senn-Weinstein monofilament wire test diminished   bilaterally. Muscle power within normal limits bilaterally. ° °Nails Thick disfigured discolored nails with subungual debris  from hallux to fifth toes bilaterally. No evidence of bacterial infection or drainage bilaterally. ° °Orthopedic  No limitations of motion  feet .  No crepitus or effusions noted.  No bony pathology or digital deformities noted. ° °Skin  normotropic skin with no porokeratosis noted bilaterally.  No signs of infections or ulcers noted.    ° °Onychomycosis  Pain in right toes  Pain in left toes ° °Consent was obtained for treatment procedures.   Mechanical debridement of nails 1-5  bilaterally performed with a nail nipper.  Filed with dremel without incident. No infection or ulcer.   ° ° °Return office visit  3 months        Told patient to return for periodic foot care and evaluation due to potential at risk complications. ° ° °Shine Mikes DPM  °

## 2020-05-06 ENCOUNTER — Ambulatory Visit (INDEPENDENT_AMBULATORY_CARE_PROVIDER_SITE_OTHER): Payer: Medicare PPO

## 2020-05-06 DIAGNOSIS — I442 Atrioventricular block, complete: Secondary | ICD-10-CM | POA: Diagnosis not present

## 2020-05-07 ENCOUNTER — Other Ambulatory Visit: Payer: Self-pay | Admitting: Internal Medicine

## 2020-05-08 LAB — CUP PACEART REMOTE DEVICE CHECK
Battery Remaining Longevity: 101 mo
Battery Remaining Percentage: 95.5 %
Battery Voltage: 2.99 V
Brady Statistic AP VP Percent: 77 %
Brady Statistic AP VS Percent: 1 %
Brady Statistic AS VP Percent: 20 %
Brady Statistic AS VS Percent: 1 %
Brady Statistic RA Percent Paced: 75 %
Brady Statistic RV Percent Paced: 98 %
Date Time Interrogation Session: 20211013025158
Implantable Lead Implant Date: 20190412
Implantable Lead Implant Date: 20190412
Implantable Lead Location: 753859
Implantable Lead Location: 753860
Implantable Pulse Generator Implant Date: 20190412
Lead Channel Impedance Value: 430 Ohm
Lead Channel Impedance Value: 460 Ohm
Lead Channel Pacing Threshold Amplitude: 0.5 V
Lead Channel Pacing Threshold Amplitude: 0.75 V
Lead Channel Pacing Threshold Pulse Width: 0.5 ms
Lead Channel Pacing Threshold Pulse Width: 0.5 ms
Lead Channel Sensing Intrinsic Amplitude: 3.6 mV
Lead Channel Sensing Intrinsic Amplitude: 6.6 mV
Lead Channel Setting Pacing Amplitude: 2 V
Lead Channel Setting Pacing Amplitude: 2.5 V
Lead Channel Setting Pacing Pulse Width: 0.5 ms
Lead Channel Setting Sensing Sensitivity: 2 mV
Pulse Gen Model: 2272
Pulse Gen Serial Number: 9009871

## 2020-05-11 NOTE — Progress Notes (Signed)
Remote pacemaker transmission.   

## 2020-06-30 ENCOUNTER — Other Ambulatory Visit: Payer: Self-pay

## 2020-06-30 ENCOUNTER — Ambulatory Visit (INDEPENDENT_AMBULATORY_CARE_PROVIDER_SITE_OTHER): Payer: Medicare PPO | Admitting: Internal Medicine

## 2020-06-30 ENCOUNTER — Encounter: Payer: Self-pay | Admitting: Internal Medicine

## 2020-06-30 VITALS — BP 116/82 | HR 60 | Ht 72.0 in | Wt 198.0 lb

## 2020-06-30 DIAGNOSIS — Z79899 Other long term (current) drug therapy: Secondary | ICD-10-CM | POA: Diagnosis not present

## 2020-06-30 DIAGNOSIS — Z950 Presence of cardiac pacemaker: Secondary | ICD-10-CM

## 2020-06-30 DIAGNOSIS — I442 Atrioventricular block, complete: Secondary | ICD-10-CM | POA: Diagnosis not present

## 2020-06-30 DIAGNOSIS — I480 Paroxysmal atrial fibrillation: Secondary | ICD-10-CM | POA: Diagnosis not present

## 2020-06-30 LAB — PACEMAKER DEVICE OBSERVATION

## 2020-06-30 MED ORDER — APIXABAN 5 MG PO TABS: 5.0000 mg | ORAL_TABLET | Freq: Two times a day (BID) | ORAL | 11 refills | Status: DC

## 2020-06-30 NOTE — Progress Notes (Signed)
Patient Care Team: Jaclyn Shaggy, MD as PCP - General (Internal Medicine) Antonieta Iba, MD as PCP - Cardiology (Cardiology)   HPI  Justin Hendrix is a 86 y.o. male Seen in follow-up for pacemaker [employer] Jude  implanted 4/19 for complete heart block presenting with syncope  History of a TIA.  Atrial fibrillation detected on his device>>>apixoban.  No bleeding. No interval syncope.  The patient denies chest pain, shortness of breath, nocturnal dyspnea, orthopnea or peripheral edema.  There have been no palpitations or lightheadedness.  Walks around the house sometimes with a walker sometimes without.  Lives with his 2 sons.  Diet is replete with sodium.    DATE TEST EF   1/15 Echo   50-55 %   4/19 Echo   60-65 %         Date Cr K Hgb  4/19 1.69 4.3 8.7  7/19 1.21 4.1 14.1  2/20 1.28 4.6 13.7     Past Medical History:  Diagnosis Date  . Benign prostatic hypertrophy   . Chest pain    a. 04/2014 Echo: EF 50-55%, mild LVH, nl RV fxn, mild to mod AS, mild AI, mild TR; b. 04/2014 MV: nl EF. No ischemia.  . Chronic prostatitis   . Elevated PSA   . Incomplete bladder emptying   . Parkinson's disease (HCC)   . Pre-syncope   . TIA (transient ischemic attack)   . Unsteady gait   . Urinary frequency     Past Surgical History:  Procedure Laterality Date  . INTRAMEDULLARY (IM) NAIL INTERTROCHANTERIC Right 11/04/2017   Procedure: INTRAMEDULLARY (IM) NAIL INTERTROCHANTRIC;  Surgeon: Bjorn Pippin, MD;  Location: [facility];  Service: Orthopedics;  Laterality: Right;  . PACEMAKER IMPLANT N/A 11/03/2017   Procedure: PACEMAKER IMPLANT;  Surgeon: Hillis Range, MD;  Location: [facility];  Service: Cardiovascular;  Laterality: N/A;    Current Meds  Medication Sig  . amantadine (SYMMETREL) 100 MG capsule Take 100 mg by mouth daily.  . Amantadine HCl 100 MG tablet   . apixaban (ELIQUIS) 5 MG TABS tablet Take 1 tablet (5 mg total) by mouth 2 (two) times daily.  Marland Kitchen  atenolol (TENORMIN) 50 MG tablet   . budesonide-formoterol (SYMBICORT) 160-4.5 MCG/ACT inhaler Inhale 2 puffs into the lungs 2 (two) times daily as needed (respiratory difficulty).   Marland Kitchen docusate sodium (COLACE) 100 MG capsule Take 1 capsule (100 mg total) by mouth 2 (two) times daily.  . finasteride (PROSCAR) 5 MG tablet Take 1 tablet (5 mg total) by mouth daily.  . fluticasone (FLONASE) 50 MCG/ACT nasal spray 2 sprays by Each Nare route daily. as needed  . ondansetron (ZOFRAN) 4 MG tablet Take 1 tablet (4 mg total) by mouth every 6 (six) hours as needed for nausea.  . pantoprazole (PROTONIX) 40 MG tablet Take 40 mg by mouth daily.  . polyethylene glycol (MIRALAX / GLYCOLAX) packet Take 17 g by mouth daily.  Marland Kitchen senna-docusate (SENOKOT-S) 8.6-50 MG tablet Take 1 tablet by mouth at bedtime.  . sertraline (ZOLOFT) 100 MG tablet Take 100 mg by mouth daily.   . tamsulosin (FLOMAX) 0.4 MG CAPS capsule Take 1 capsule (0.4 mg total) by mouth daily after breakfast.  . [DISCONTINUED] apixaban (ELIQUIS) 5 MG TABS tablet Take 1 tablet (5 mg total) by mouth 2 (two) times daily. LABS NEEDED FOR FURTHER REFILLS    No Known Allergies    Review of Systems negative except from HPI  and PMH  Physical Exam BP 116/82 (BP Location: Right Arm, Patient Position: Sitting, Cuff Size: Normal)   Pulse 60   Ht 6' (1.829 m)   Wt 198 lb (89.8 kg)   SpO2 93%   BMI 26.85 kg/m  Well developed and well nourished in no acute distress HENT normal Neck supple with JVP-flat Clear Device pocket well healed; without hematoma or erythema.  There is no tethering  Regular rate and rhythm, no  murmur Abd-soft with active BS No Clubbing cyanosis  edema Skin-warm and dry multiple keratoses A & Oriented  Grossly normal sensory and motor function  ECG  AV Pacing @ 60    Assessment and  Plan  Complete heart block  Falls-infrequent  TIA  Atrial fibrillation  Pacemaker-[employer] Jude   .      The patient's device was  interrogated.  The information was reviewed. No changes were made in the programming.  Normal device function   Minimal atrial fibrillation; On Anticoagulation;  No bleeding issues

## 2020-06-30 NOTE — Patient Instructions (Signed)
Medication Instructions:  - Your physician recommends that you continue on your current medications as directed. Please refer to the Current Medication list given to you today.  *If you need a refill on your cardiac medications before your next appointment, please call your pharmacy*   Lab Work: - Your physician recommends that you have lab work today: BMP/ CBC   If you have labs (blood work) drawn today and your tests are completely normal, you will receive your results only by: . MyChart Message (if you have MyChart) OR . A paper copy in the mail If you have any lab test that is abnormal or we need to change your treatment, we will call you to review the results.   Testing/Procedures: - none ordered   Follow-Up: At [facility], you and your health needs are our priority.  As part of our continuing mission to provide you with exceptional heart care, we have created designated Provider Care Teams.  These Care Teams include your primary Cardiologist (physician) and Advanced Practice Providers (APPs -  Physician Assistants and Nurse Practitioners) who all work together to provide you with the care you need, when you need it.  We recommend signing up for the patient portal called "MyChart".  Sign up information is provided on this After Visit Summary.  MyChart is used to connect with patients for Virtual Visits (Telemedicine).  Patients are able to view lab/test results, encounter notes, upcoming appointments, etc.  Non-urgent messages can be sent to your provider as well.   To learn more about what you can do with MyChart, go to [email or web address].    Your next appointment:   1 year(s)  The format for your next appointment:   In Person  Provider:   Steven Klein, MD   Other Instructions n/a  

## 2020-07-01 LAB — CBC WITH DIFFERENTIAL/PLATELET
Basophils Absolute: 0.1 10*3/uL (ref 0.0–0.2)
Basos: 1 %
EOS (ABSOLUTE): 0.2 10*3/uL (ref 0.0–0.4)
Eos: 4 %
Hematocrit: 40.1 % (ref 37.5–51.0)
Hemoglobin: 13.2 g/dL (ref 13.0–17.7)
Immature Grans (Abs): 0 10*3/uL (ref 0.0–0.1)
Immature Granulocytes: 0 %
Lymphocytes Absolute: 2.3 10*3/uL (ref 0.7–3.1)
Lymphs: 35 %
MCH: 28.8 pg (ref 26.6–33.0)
MCHC: 32.9 g/dL (ref 31.5–35.7)
MCV: 87 fL (ref 79–97)
Monocytes Absolute: 0.5 10*3/uL (ref 0.1–0.9)
Monocytes: 8 %
Neutrophils Absolute: 3.4 10*3/uL (ref 1.4–7.0)
Neutrophils: 52 %
Platelets: 263 10*3/uL (ref 150–450)
RBC: 4.59 x10E6/uL (ref 4.14–5.80)
RDW: 15.1 % (ref 11.6–15.4)
WBC: 6.5 10*3/uL (ref 3.4–10.8)

## 2020-07-01 LAB — BASIC METABOLIC PANEL
BUN/Creatinine Ratio: 11 (ref 10–24)
BUN: 14 mg/dL (ref 10–36)
CO2: 26 mmol/L (ref 20–29)
Calcium: 9.4 mg/dL (ref 8.6–10.2)
Chloride: 98 mmol/L (ref 96–106)
Creatinine, Ser: 1.22 mg/dL (ref 0.76–1.27)
GFR calc Af Amer: 58 mL/min/{1.73_m2} — ABNORMAL LOW (ref >59)
GFR calc non Af Amer: 50 mL/min/{1.73_m2} — ABNORMAL LOW (ref >59)
Glucose: 91 mg/dL (ref 65–99)
Potassium: 4.5 mmol/L (ref 3.5–5.2)
Sodium: 134 mmol/L (ref 134–144)

## 2020-07-06 ENCOUNTER — Telehealth: Payer: Self-pay | Admitting: Internal Medicine

## 2020-07-06 NOTE — Telephone Encounter (Signed)
Duke Salvia, MD  07/04/2020 10:55 AM EST      Please Inform Patient that labs are normal with minimal normal age realted decrease in renal function  Thanks

## 2020-07-06 NOTE — Telephone Encounter (Signed)
Attempted to call the patient. No answer- I left a detailed message of results on the home/ contact number (ok per DPR). I asked they call back with any further questions/ concerns.

## 2020-07-20 ENCOUNTER — Ambulatory Visit: Payer: Medicare PPO | Admitting: Podiatry

## 2020-07-20 ENCOUNTER — Other Ambulatory Visit: Payer: Self-pay

## 2020-07-20 DIAGNOSIS — D689 Coagulation defect, unspecified: Secondary | ICD-10-CM

## 2020-07-20 DIAGNOSIS — M79609 Pain in unspecified limb: Secondary | ICD-10-CM | POA: Diagnosis not present

## 2020-07-20 DIAGNOSIS — B351 Tinea unguium: Secondary | ICD-10-CM

## 2020-07-20 NOTE — Progress Notes (Signed)
This patient returns to my office for at risk foot care.  This patient requires this care by a professional since this patient will be at risk due to having  Coagulation defect.This patient is unable to cut nails themselves since the patient cannot reach their nails.These nails are painful walking and wearing shoes.  This patient presents for at risk foot care today. ° °General Appearance  Alert, conversant and in no acute stress. ° °Vascular  Dorsalis pedis and posterior tibial  pulses are weakly  palpable  bilaterally.  Capillary return is within normal limits  bilaterally. Temperature is within normal limits  bilaterally. ° °Neurologic  Senn-Weinstein monofilament wire test diminished   bilaterally. Muscle power within normal limits bilaterally. ° °Nails Thick disfigured discolored nails with subungual debris  from hallux to fifth toes bilaterally. No evidence of bacterial infection or drainage bilaterally. ° °Orthopedic  No limitations of motion  feet .  No crepitus or effusions noted.  No bony pathology or digital deformities noted. ° °Skin  normotropic skin with no porokeratosis noted bilaterally.  No signs of infections or ulcers noted.    ° °Onychomycosis  Pain in right toes  Pain in left toes ° °Consent was obtained for treatment procedures.   Mechanical debridement of nails 1-5  bilaterally performed with a nail nipper.  Filed with dremel without incident. No infection or ulcer.   ° ° °Return office visit  3 months        Told patient to return for periodic foot care and evaluation due to potential at risk complications. ° ° °Ayvah Caroll DPM  °

## 2020-08-05 ENCOUNTER — Ambulatory Visit (INDEPENDENT_AMBULATORY_CARE_PROVIDER_SITE_OTHER): Payer: Medicare PPO

## 2020-08-05 DIAGNOSIS — I442 Atrioventricular block, complete: Secondary | ICD-10-CM | POA: Diagnosis not present

## 2020-08-05 LAB — CUP PACEART REMOTE DEVICE CHECK
Battery Remaining Longevity: 100 mo
Battery Remaining Percentage: 95.5 %
Battery Voltage: 2.99 V
Brady Statistic AP VP Percent: 67 %
Brady Statistic AP VS Percent: 1 %
Brady Statistic AS VP Percent: 32 %
Brady Statistic AS VS Percent: 1 %
Brady Statistic RA Percent Paced: 66 %
Brady Statistic RV Percent Paced: 99 %
Date Time Interrogation Session: 20220112023350
Implantable Lead Implant Date: 20190412
Implantable Lead Implant Date: 20190412
Implantable Lead Location: 753859
Implantable Lead Location: 753860
Implantable Pulse Generator Implant Date: 20190412
Lead Channel Impedance Value: 440 Ohm
Lead Channel Impedance Value: 450 Ohm
Lead Channel Pacing Threshold Amplitude: 0.5 V
Lead Channel Pacing Threshold Amplitude: 0.5 V
Lead Channel Pacing Threshold Pulse Width: 0.5 ms
Lead Channel Pacing Threshold Pulse Width: 0.5 ms
Lead Channel Sensing Intrinsic Amplitude: 4 mV
Lead Channel Sensing Intrinsic Amplitude: 4.2 mV
Lead Channel Setting Pacing Amplitude: 2 V
Lead Channel Setting Pacing Amplitude: 2.5 V
Lead Channel Setting Pacing Pulse Width: 0.5 ms
Lead Channel Setting Sensing Sensitivity: 2 mV
Pulse Gen Model: 2272
Pulse Gen Serial Number: 9009871

## 2020-08-18 NOTE — Progress Notes (Signed)
Remote pacemaker transmission.   

## 2020-10-19 ENCOUNTER — Ambulatory Visit: Payer: Medicare PPO | Admitting: Podiatry

## 2020-10-19 ENCOUNTER — Other Ambulatory Visit: Payer: Self-pay

## 2020-10-19 ENCOUNTER — Encounter: Payer: Self-pay | Admitting: Podiatry

## 2020-10-19 DIAGNOSIS — M79609 Pain in unspecified limb: Secondary | ICD-10-CM | POA: Diagnosis not present

## 2020-10-19 DIAGNOSIS — B351 Tinea unguium: Secondary | ICD-10-CM

## 2020-10-19 DIAGNOSIS — D689 Coagulation defect, unspecified: Secondary | ICD-10-CM

## 2020-10-19 NOTE — Progress Notes (Signed)
This patient returns to my office for at risk foot care.  This patient requires this care by a professional since this patient will be at risk due to having  Coagulation defect.This patient is unable to cut nails themselves since the patient cannot reach their nails.These nails are painful walking and wearing shoes.  This patient presents for at risk foot care today. ° °General Appearance  Alert, conversant and in no acute stress. ° °Vascular  Dorsalis pedis and posterior tibial  pulses are weakly  palpable  bilaterally.  Capillary return is within normal limits  bilaterally. Temperature is within normal limits  bilaterally. ° °Neurologic  Senn-Weinstein monofilament wire test diminished   bilaterally. Muscle power within normal limits bilaterally. ° °Nails Thick disfigured discolored nails with subungual debris  from hallux to fifth toes bilaterally. No evidence of bacterial infection or drainage bilaterally. ° °Orthopedic  No limitations of motion  feet .  No crepitus or effusions noted.  No bony pathology or digital deformities noted. ° °Skin  normotropic skin with no porokeratosis noted bilaterally.  No signs of infections or ulcers noted.    ° °Onychomycosis  Pain in right toes  Pain in left toes ° °Consent was obtained for treatment procedures.   Mechanical debridement of nails 1-5  bilaterally performed with a nail nipper.  Filed with dremel without incident. No infection or ulcer.   ° ° °Return office visit  3 months        Told patient to return for periodic foot care and evaluation due to potential at risk complications. ° ° °Zenora Karpel DPM  °

## 2020-11-04 ENCOUNTER — Ambulatory Visit (INDEPENDENT_AMBULATORY_CARE_PROVIDER_SITE_OTHER): Payer: Medicare PPO

## 2020-11-04 DIAGNOSIS — I442 Atrioventricular block, complete: Secondary | ICD-10-CM

## 2020-11-05 LAB — CUP PACEART REMOTE DEVICE CHECK
Battery Remaining Longevity: 100 mo
Battery Remaining Percentage: 95.5 %
Battery Voltage: 2.99 V
Brady Statistic AP VP Percent: 64 %
Brady Statistic AP VS Percent: 1 %
Brady Statistic AS VP Percent: 36 %
Brady Statistic AS VS Percent: 1 %
Brady Statistic RA Percent Paced: 63 %
Brady Statistic RV Percent Paced: 99 %
Date Time Interrogation Session: 20220413021539
Implantable Lead Implant Date: 20190412
Implantable Lead Implant Date: 20190412
Implantable Lead Location: 753859
Implantable Lead Location: 753860
Implantable Pulse Generator Implant Date: 20190412
Lead Channel Impedance Value: 460 Ohm
Lead Channel Impedance Value: 460 Ohm
Lead Channel Pacing Threshold Amplitude: 0.5 V
Lead Channel Pacing Threshold Amplitude: 0.5 V
Lead Channel Pacing Threshold Pulse Width: 0.5 ms
Lead Channel Pacing Threshold Pulse Width: 0.5 ms
Lead Channel Sensing Intrinsic Amplitude: 3.6 mV
Lead Channel Sensing Intrinsic Amplitude: 7.3 mV
Lead Channel Setting Pacing Amplitude: 2 V
Lead Channel Setting Pacing Amplitude: 2.5 V
Lead Channel Setting Pacing Pulse Width: 0.5 ms
Lead Channel Setting Sensing Sensitivity: 2 mV
Pulse Gen Model: 2272
Pulse Gen Serial Number: 9009871

## 2020-11-19 NOTE — Progress Notes (Signed)
Remote pacemaker transmission.   

## 2020-11-26 ENCOUNTER — Other Ambulatory Visit: Payer: Self-pay | Admitting: Urology

## 2020-11-26 DIAGNOSIS — N401 Enlarged prostate with lower urinary tract symptoms: Secondary | ICD-10-CM

## 2020-12-29 ENCOUNTER — Other Ambulatory Visit: Payer: Self-pay | Admitting: Urology

## 2021-01-21 ENCOUNTER — Ambulatory Visit: Payer: Medicare PPO | Admitting: Podiatry

## 2021-01-21 ENCOUNTER — Encounter: Payer: Self-pay | Admitting: Podiatry

## 2021-01-21 ENCOUNTER — Other Ambulatory Visit: Payer: Self-pay

## 2021-01-21 DIAGNOSIS — B351 Tinea unguium: Secondary | ICD-10-CM | POA: Diagnosis not present

## 2021-01-21 DIAGNOSIS — M79609 Pain in unspecified limb: Secondary | ICD-10-CM | POA: Diagnosis not present

## 2021-01-21 DIAGNOSIS — D689 Coagulation defect, unspecified: Secondary | ICD-10-CM | POA: Diagnosis not present

## 2021-01-21 NOTE — Progress Notes (Signed)
This patient returns to my office for at risk foot care.  This patient requires this care by a professional since this patient will be at risk due to having  Coagulation defect.This patient is unable to cut nails themselves since the patient cannot reach their nails.These nails are painful walking and wearing shoes.  This patient presents for at risk foot care today. ° °General Appearance  Alert, conversant and in no acute stress. ° °Vascular  Dorsalis pedis and posterior tibial  pulses are weakly  palpable  bilaterally.  Capillary return is within normal limits  bilaterally. Temperature is within normal limits  bilaterally. ° °Neurologic  Senn-Weinstein monofilament wire test diminished   bilaterally. Muscle power within normal limits bilaterally. ° °Nails Thick disfigured discolored nails with subungual debris  from hallux to fifth toes bilaterally. No evidence of bacterial infection or drainage bilaterally. ° °Orthopedic  No limitations of motion  feet .  No crepitus or effusions noted.  No bony pathology or digital deformities noted. ° °Skin  normotropic skin with no porokeratosis noted bilaterally.  No signs of infections or ulcers noted.    ° °Onychomycosis  Pain in right toes  Pain in left toes ° °Consent was obtained for treatment procedures.   Mechanical debridement of nails 1-5  bilaterally performed with a nail nipper.  Filed with dremel without incident. No infection or ulcer.   ° ° °Return office visit  3 months        Told patient to return for periodic foot care and evaluation due to potential at risk complications. ° ° °Esmay Amspacher DPM  °

## 2021-02-03 ENCOUNTER — Ambulatory Visit (INDEPENDENT_AMBULATORY_CARE_PROVIDER_SITE_OTHER): Payer: Medicare PPO

## 2021-02-03 DIAGNOSIS — I442 Atrioventricular block, complete: Secondary | ICD-10-CM | POA: Diagnosis not present

## 2021-02-03 LAB — CUP PACEART REMOTE DEVICE CHECK
Battery Remaining Longevity: 64 mo
Battery Remaining Percentage: 65 %
Battery Voltage: 2.98 V
Brady Statistic AP VP Percent: 62 %
Brady Statistic AP VS Percent: 1 %
Brady Statistic AS VP Percent: 37 %
Brady Statistic AS VS Percent: 1 %
Brady Statistic RA Percent Paced: 60 %
Brady Statistic RV Percent Paced: 99 %
Date Time Interrogation Session: 20220713020013
Implantable Lead Implant Date: 20190412
Implantable Lead Implant Date: 20190412
Implantable Lead Location: 753859
Implantable Lead Location: 753860
Implantable Pulse Generator Implant Date: 20190412
Lead Channel Impedance Value: 450 Ohm
Lead Channel Impedance Value: 460 Ohm
Lead Channel Pacing Threshold Amplitude: 0.5 V
Lead Channel Pacing Threshold Amplitude: 0.5 V
Lead Channel Pacing Threshold Pulse Width: 0.5 ms
Lead Channel Pacing Threshold Pulse Width: 0.5 ms
Lead Channel Sensing Intrinsic Amplitude: 3.5 mV
Lead Channel Sensing Intrinsic Amplitude: 6.6 mV
Lead Channel Setting Pacing Amplitude: 2 V
Lead Channel Setting Pacing Amplitude: 2.5 V
Lead Channel Setting Pacing Pulse Width: 0.5 ms
Lead Channel Setting Sensing Sensitivity: 2 mV
Pulse Gen Model: 2272
Pulse Gen Serial Number: 9009871

## 2021-02-26 NOTE — Progress Notes (Signed)
Remote pacemaker transmission.   

## 2021-03-08 ENCOUNTER — Ambulatory Visit: Payer: Self-pay | Admitting: Urology

## 2021-03-10 ENCOUNTER — Other Ambulatory Visit: Payer: Self-pay

## 2021-03-10 ENCOUNTER — Ambulatory Visit: Payer: Medicare PPO | Admitting: Urology

## 2021-03-10 ENCOUNTER — Encounter: Payer: Self-pay | Admitting: Urology

## 2021-03-10 VITALS — BP 126/84 | Ht 72.0 in | Wt 205.0 lb

## 2021-03-10 DIAGNOSIS — N401 Enlarged prostate with lower urinary tract symptoms: Secondary | ICD-10-CM | POA: Diagnosis not present

## 2021-03-10 LAB — BLADDER SCAN AMB NON-IMAGING: Scan Result: 57

## 2021-03-10 MED ORDER — FINASTERIDE 5 MG PO TABS: 5.0000 mg | ORAL_TABLET | Freq: Every day | ORAL | 3 refills | Status: DC

## 2021-03-10 MED ORDER — TAMSULOSIN HCL 0.4 MG PO CAPS: ORAL_CAPSULE | ORAL | 3 refills | Status: AC

## 2021-03-10 NOTE — Addendum Note (Signed)
Addended by: Honor Loh on: 03/10/2021 02:44 PM   Modules accepted: Orders

## 2021-03-10 NOTE — Progress Notes (Signed)
03/10/2021 2:26 PM   Justin Hendrix [birth year] [id number]  Referring provider: Jaclyn Shaggy, MD [address]   [place],  [place] [address]  Chief Complaint  Patient presents with   Benign Prostatic Hypertrophy    Urologic history:   1.  BPH with lower urinary tract symptoms             -Combination therapy tamsulosin/finasteride  HPI: 87 y.o. male presents for annual follow-up.  No significant problems since last years visit No bothersome LUTS Remains on tamsulosin/finasteride Denies dysuria, gross hematuria   PMH: Past Medical History:  Diagnosis Date   Benign prostatic hypertrophy    Chest pain    a. 04/2014 Echo: EF 50-55%, mild LVH, nl RV fxn, mild to mod AS, mild AI, mild TR; b. 04/2014 MV: nl EF. No ischemia.   Chronic prostatitis    Elevated PSA    Incomplete bladder emptying    Parkinson's disease (HCC)    Pre-syncope    TIA (transient ischemic attack)    Unsteady gait    Urinary frequency     Surgical History: Past Surgical History:  Procedure Laterality Date   INTRAMEDULLARY (IM) NAIL INTERTROCHANTERIC Right 11/04/2017   Procedure: INTRAMEDULLARY (IM) NAIL INTERTROCHANTRIC;  Surgeon: Bjorn Pippin, MD;  Location: [facility] OR;  Service: Orthopedics;  Laterality: Right;   PACEMAKER IMPLANT N/A 11/03/2017   Procedure: PACEMAKER IMPLANT;  Surgeon: Hillis Range, MD;  Location: [facility];  Service: Cardiovascular;  Laterality: N/A;    Home Medications:  Allergies as of 03/10/2021   No Known Allergies      Medication List        Accurate as of March 10, 2021  2:26 PM. If you have any questions, ask your nurse or doctor.          amantadine 100 MG capsule Commonly known as: SYMMETREL Take 100 mg by mouth daily.   Amantadine HCl 100 MG tablet   apixaban 5 MG Tabs tablet Commonly known as: Eliquis Take 1 tablet (5 mg total) by mouth 2 (two) times daily.   atenolol 50 MG tablet Commonly known as: TENORMIN    budesonide-formoterol 160-4.5 MCG/ACT inhaler Commonly known as: SYMBICORT Inhale 2 puffs into the lungs 2 (two) times daily as needed (respiratory difficulty).   docusate sodium 100 MG capsule Commonly known as: COLACE Take 1 capsule (100 mg total) by mouth 2 (two) times daily.   finasteride 5 MG tablet Commonly known as: PROSCAR Take 1 tablet (5 mg total) by mouth daily.   fluticasone 50 MCG/ACT nasal spray Commonly known as: FLONASE 2 sprays by Each Nare route daily. as needed   ondansetron 4 MG tablet Commonly known as: ZOFRAN Take 1 tablet (4 mg total) by mouth every 6 (six) hours as needed for nausea.   pantoprazole 40 MG tablet Commonly known as: PROTONIX Take 40 mg by mouth daily.   polyethylene glycol 17 g packet Commonly known as: MIRALAX / GLYCOLAX Take 17 g by mouth daily.   senna-docusate 8.6-50 MG tablet Commonly known as: Senokot-S Take 1 tablet by mouth at bedtime.   sertraline 100 MG tablet Commonly known as: ZOLOFT Take 100 mg by mouth daily.   tamsulosin 0.4 MG Caps capsule Commonly known as: FLOMAX TAKE 1 CAPSULE BY MOUTH ONCE DAILY 30 MINUTES AFTER LARGEST MEAL.        Allergies: No Known Allergies  Family History: Family History  Problem Relation Age of Onset   Hypertension Father  Social History:  reports that he has quit smoking. His smokeless tobacco use includes chew. He reports that he does not drink alcohol and does not use drugs.   Physical Exam: BP 126/84   Ht 6' (1.829 m)   Wt 205 lb (93 kg)   BMI 27.80 kg/m   Constitutional:  Alert, No acute distress. HEENT: [place] AT, moist mucus membranes.  Trachea midline, no masses. Cardiovascular: No clubbing, cyanosis, or edema. Respiratory: Normal respiratory effort, no increased work of breathing. GI: Abdomen is soft, nontender, nondistended, no abdominal masses Psychiatric: Normal mood and affect.   Assessment & Plan:    1.  BPH with LUTS Doing well Bladder scan PVR 57  mL Tamsulosin/finasteride were refilled Continue annual follow-up   [name], MD  [facility] [address] [place], [place] [address] [phone]

## 2021-04-22 ENCOUNTER — Encounter: Payer: Self-pay | Admitting: Podiatry

## 2021-04-22 ENCOUNTER — Ambulatory Visit: Payer: Medicare PPO | Admitting: Podiatry

## 2021-04-22 ENCOUNTER — Other Ambulatory Visit: Payer: Self-pay

## 2021-04-22 DIAGNOSIS — D689 Coagulation defect, unspecified: Secondary | ICD-10-CM

## 2021-04-22 DIAGNOSIS — B351 Tinea unguium: Secondary | ICD-10-CM

## 2021-04-22 DIAGNOSIS — M79609 Pain in unspecified limb: Secondary | ICD-10-CM

## 2021-04-22 NOTE — Progress Notes (Signed)
This patient returns to my office for at risk foot care.  This patient requires this care by a professional since this patient will be at risk due to having  Coagulation defect.This patient is unable to cut nails themselves since the patient cannot reach their nails.These nails are painful walking and wearing shoes.  This patient presents for at risk foot care today. ° °General Appearance  Alert, conversant and in no acute stress. ° °Vascular  Dorsalis pedis and posterior tibial  pulses are weakly  palpable  bilaterally.  Capillary return is within normal limits  bilaterally. Temperature is within normal limits  bilaterally. ° °Neurologic  Senn-Weinstein monofilament wire test diminished   bilaterally. Muscle power within normal limits bilaterally. ° °Nails Thick disfigured discolored nails with subungual debris  from hallux to fifth toes bilaterally. No evidence of bacterial infection or drainage bilaterally. ° °Orthopedic  No limitations of motion  feet .  No crepitus or effusions noted.  No bony pathology or digital deformities noted. ° °Skin  normotropic skin with no porokeratosis noted bilaterally.  No signs of infections or ulcers noted.    ° °Onychomycosis  Pain in right toes  Pain in left toes ° °Consent was obtained for treatment procedures.   Mechanical debridement of nails 1-5  bilaterally performed with a nail nipper.  Filed with dremel without incident. No infection or ulcer.   ° ° °Return office visit  3 months        Told patient to return for periodic foot care and evaluation due to potential at risk complications. ° ° °Tasia Liz DPM  °

## 2021-05-05 ENCOUNTER — Ambulatory Visit (INDEPENDENT_AMBULATORY_CARE_PROVIDER_SITE_OTHER): Payer: Medicare PPO

## 2021-05-05 DIAGNOSIS — I442 Atrioventricular block, complete: Secondary | ICD-10-CM | POA: Diagnosis not present

## 2021-05-06 LAB — CUP PACEART REMOTE DEVICE CHECK
Battery Remaining Longevity: 61 mo
Battery Remaining Percentage: 62 %
Battery Voltage: 2.98 V
Brady Statistic AP VP Percent: 55 %
Brady Statistic AP VS Percent: 1 %
Brady Statistic AS VP Percent: 42 %
Brady Statistic AS VS Percent: 1 %
Brady Statistic RA Percent Paced: 52 %
Brady Statistic RV Percent Paced: 98 %
Date Time Interrogation Session: 20221012033505
Implantable Lead Implant Date: 20190412
Implantable Lead Implant Date: 20190412
Implantable Lead Location: 753859
Implantable Lead Location: 753860
Implantable Pulse Generator Implant Date: 20190412
Lead Channel Impedance Value: 440 Ohm
Lead Channel Impedance Value: 450 Ohm
Lead Channel Pacing Threshold Amplitude: 0.5 V
Lead Channel Pacing Threshold Amplitude: 0.5 V
Lead Channel Pacing Threshold Pulse Width: 0.5 ms
Lead Channel Pacing Threshold Pulse Width: 0.5 ms
Lead Channel Sensing Intrinsic Amplitude: 3.6 mV
Lead Channel Sensing Intrinsic Amplitude: 6.8 mV
Lead Channel Setting Pacing Amplitude: 2 V
Lead Channel Setting Pacing Amplitude: 2.5 V
Lead Channel Setting Pacing Pulse Width: 0.5 ms
Lead Channel Setting Sensing Sensitivity: 2 mV
Pulse Gen Model: 2272
Pulse Gen Serial Number: 9009871

## 2021-05-13 NOTE — Progress Notes (Signed)
Remote pacemaker transmission.   

## 2021-06-15 ENCOUNTER — Other Ambulatory Visit: Payer: Self-pay | Admitting: Internal Medicine

## 2021-06-15 NOTE — Telephone Encounter (Signed)
Refill request

## 2021-06-15 NOTE — Telephone Encounter (Signed)
Pt last saw Dr Graciela Husbands 06/30/20, last labs 06/30/20 Creat 1.22, age 87, weight 93kg, based on specified criteria pt is on appropriate dosage of Eliquis 5mg  BID for afib.  Will refill rx.

## 2021-06-15 NOTE — Telephone Encounter (Signed)
This is a [facility] pt 

## 2021-07-22 ENCOUNTER — Encounter: Payer: Self-pay | Admitting: Podiatry

## 2021-07-22 ENCOUNTER — Other Ambulatory Visit: Payer: Self-pay

## 2021-07-22 ENCOUNTER — Ambulatory Visit: Payer: Medicare PPO | Admitting: Podiatry

## 2021-07-22 DIAGNOSIS — D689 Coagulation defect, unspecified: Secondary | ICD-10-CM | POA: Diagnosis not present

## 2021-07-22 DIAGNOSIS — B351 Tinea unguium: Secondary | ICD-10-CM

## 2021-07-22 DIAGNOSIS — M79609 Pain in unspecified limb: Secondary | ICD-10-CM | POA: Diagnosis not present

## 2021-07-22 NOTE — Progress Notes (Signed)
This patient returns to my office for at risk foot care.  This patient requires this care by a professional since this patient will be at risk due to having  Coagulation defect.This patient is unable to cut nails themselves since the patient cannot reach their nails.These nails are painful walking and wearing shoes.  This patient presents for at risk foot care today.  General Appearance  Alert, conversant and in no acute stress.  Vascular  Dorsalis pedis and posterior tibial  pulses are weakly  palpable  bilaterally.  Capillary return is within normal limits  bilaterally. Temperature is within normal limits  bilaterally.  Neurologic  Senn-Weinstein monofilament wire test diminished   bilaterally. Muscle power within normal limits bilaterally.  Nails Thick disfigured discolored nails with subungual debris  from hallux to fifth toes bilaterally. No evidence of bacterial infection or drainage bilaterally.  Orthopedic  No limitations of motion  feet .  No crepitus or effusions noted.  No bony pathology or digital deformities noted.  Skin  normotropic skin with no porokeratosis noted bilaterally.  No signs of infections or ulcers noted.     Onychomycosis  Pain in right toes  Pain in left toes  Consent was obtained for treatment procedures.   Mechanical debridement of nails 1-5  bilaterally performed with a nail nipper.  Filed with dremel without incident. No infection or ulcer.     Return office visit  3 months        Told patient to return for periodic foot care and evaluation due to potential at risk complications.   Helane Gunther DPM

## 2021-08-04 ENCOUNTER — Ambulatory Visit (INDEPENDENT_AMBULATORY_CARE_PROVIDER_SITE_OTHER): Payer: Medicare PPO

## 2021-08-04 DIAGNOSIS — I442 Atrioventricular block, complete: Secondary | ICD-10-CM

## 2021-08-04 LAB — CUP PACEART REMOTE DEVICE CHECK
Battery Remaining Longevity: 59 mo
Battery Remaining Percentage: 59 %
Battery Voltage: 2.98 V
Brady Statistic AP VP Percent: 54 %
Brady Statistic AP VS Percent: 1 %
Brady Statistic AS VP Percent: 44 %
Brady Statistic AS VS Percent: 1 %
Brady Statistic RA Percent Paced: 51 %
Brady Statistic RV Percent Paced: 98 %
Date Time Interrogation Session: 20230111031834
Implantable Lead Implant Date: 20190412
Implantable Lead Implant Date: 20190412
Implantable Lead Location: 753859
Implantable Lead Location: 753860
Implantable Pulse Generator Implant Date: 20190412
Lead Channel Impedance Value: 450 Ohm
Lead Channel Impedance Value: 510 Ohm
Lead Channel Pacing Threshold Amplitude: 0.5 V
Lead Channel Pacing Threshold Amplitude: 0.5 V
Lead Channel Pacing Threshold Pulse Width: 0.5 ms
Lead Channel Pacing Threshold Pulse Width: 0.5 ms
Lead Channel Sensing Intrinsic Amplitude: 1.9 mV
Lead Channel Sensing Intrinsic Amplitude: 8.8 mV
Lead Channel Setting Pacing Amplitude: 2 V
Lead Channel Setting Pacing Amplitude: 2.5 V
Lead Channel Setting Pacing Pulse Width: 0.5 ms
Lead Channel Setting Sensing Sensitivity: 2 mV
Pulse Gen Model: 2272
Pulse Gen Serial Number: 9009871

## 2021-08-13 NOTE — Progress Notes (Signed)
Remote pacemaker transmission.   

## 2021-10-15 ENCOUNTER — Emergency Department
Admission: EM | Admit: 2021-10-15 | Discharge: 2021-10-15 | Disposition: A | Discharge status: Home / Self Care | Payer: Medicare PPO | Attending: Emergency Medicine | Admitting: Emergency Medicine

## 2021-10-15 ENCOUNTER — Other Ambulatory Visit: Payer: Self-pay

## 2021-10-15 ENCOUNTER — Emergency Department: Payer: Medicare PPO

## 2021-10-15 DIAGNOSIS — R000 Tachycardia, unspecified: Secondary | ICD-10-CM | POA: Diagnosis not present

## 2021-10-15 DIAGNOSIS — Z950 Presence of cardiac pacemaker: Secondary | ICD-10-CM | POA: Diagnosis not present

## 2021-10-15 DIAGNOSIS — Z20822 Contact with and (suspected) exposure to covid-19: Secondary | ICD-10-CM | POA: Insufficient documentation

## 2021-10-15 DIAGNOSIS — R531 Weakness: Secondary | ICD-10-CM | POA: Diagnosis present

## 2021-10-15 LAB — COMPREHENSIVE METABOLIC PANEL
ALT: 7 U/L (ref 0–44)
AST: 17 U/L (ref 15–41)
Albumin: 3.3 g/dL — ABNORMAL LOW (ref 3.5–5.0)
Alkaline Phosphatase: 83 U/L (ref 38–126)
Anion gap: 12 (ref 5–15)
BUN: 20 mg/dL (ref 8–23)
CO2: 23 mmol/L (ref 22–32)
Calcium: 8.8 mg/dL — ABNORMAL LOW (ref 8.9–10.3)
Chloride: 98 mmol/L (ref 98–111)
Creatinine, Ser: 1.66 mg/dL — ABNORMAL HIGH (ref 0.61–1.24)
GFR, Estimated: 37 mL/min — ABNORMAL LOW (ref >60)
Glucose, Bld: 108 mg/dL — ABNORMAL HIGH (ref 70–99)
Potassium: 3.8 mmol/L (ref 3.5–5.1)
Sodium: 133 mmol/L — ABNORMAL LOW (ref 135–145)
Total Bilirubin: 1.1 mg/dL (ref 0.3–1.2)
Total Protein: 7.1 g/dL (ref 6.5–8.1)

## 2021-10-15 LAB — URINALYSIS, COMPLETE (UACMP) WITH MICROSCOPIC
Bilirubin Urine: NEGATIVE
Glucose, UA: NEGATIVE mg/dL
Hgb urine dipstick: NEGATIVE
Ketones, ur: NEGATIVE mg/dL
Leukocytes,Ua: NEGATIVE
Nitrite: NEGATIVE
Protein, ur: NEGATIVE mg/dL
Specific Gravity, Urine: 1.019 (ref 1.005–1.030)
pH: 5 (ref 5.0–8.0)

## 2021-10-15 LAB — RESP PANEL BY RT-PCR (FLU A&B, COVID) ARPGX2
Influenza A by PCR: NEGATIVE
Influenza B by PCR: NEGATIVE
SARS Coronavirus 2 by RT PCR: NEGATIVE

## 2021-10-15 LAB — CBC
HCT: 40.8 % (ref 39.0–52.0)
Hemoglobin: 13 g/dL (ref 13.0–17.0)
MCH: 28.3 pg (ref 26.0–34.0)
MCHC: 31.9 g/dL (ref 30.0–36.0)
MCV: 88.9 fL (ref 80.0–100.0)
Platelets: 290 10*3/uL (ref 150–400)
RBC: 4.59 MIL/uL (ref 4.22–5.81)
RDW: 16.7 % — ABNORMAL HIGH (ref 11.5–15.5)
WBC: 9.3 10*3/uL (ref 4.0–10.5)
nRBC: 0 % (ref 0.0–0.2)

## 2021-10-15 LAB — TROPONIN I (HIGH SENSITIVITY): Troponin I (High Sensitivity): 9 ng/L (ref <18)

## 2021-10-15 MED ORDER — SODIUM CHLORIDE 0.9 % IV BOLUS
500.0000 mL | Freq: Once | INTRAVENOUS | Status: AC
  Administered 2021-10-15: 500 mL via INTRAVENOUS

## 2021-10-15 NOTE — ED Triage Notes (Signed)
BIB [facility] from home. Pt complaint of weakness, sudden onset since this morning. Vitals stable for EMS. Pt unsteady on his feet. Paced reading on ECG.  ?120/80 BP ? ?

## 2021-10-15 NOTE — ED Provider Notes (Signed)
? ?[facility] ?Provider Note ? ? ? Event Date/Time  ? First MD Initiated Contact with Patient 10/15/21 1356   ?  (approximate) ? ?History  ? ?Chief Complaint: Weakness ? ?HPI ? ?Justin Hendrix is a 88 y.o. male with a past medical history of Parkinson's, unsteady gait, presents to the emergency department for weakness.  According EMS report they were called out for weakness that started this morning.  Here patient admits he has been feeling somewhat more weak this morning but otherwise denies any specific complaints.  No chest pain no abdominal pain no cough congestion fever vomiting or diarrhea.  No urinary complaints.  Vital signs overall reassuring besides slight tachycardia. ? ?Physical Exam  ? ?Triage Vital Signs: ?ED Triage Vitals  ?Enc Vitals Group  ?   BP 10/15/21 1400 (!) 134/116  ?   Pulse Rate 10/15/21 1400 (!) 105  ?   Resp 10/15/21 1400 20  ?   Temp 10/15/21 1400 (!) 97.4 ?F (36.3 ?C)  ?   Temp Source 10/15/21 1400 Oral  ?   SpO2 10/15/21 1400 95 %  ?   Weight 10/15/21 1357 182 lb (82.6 kg)  ?   Height 10/15/21 1357 6' (1.829 m)  ?   Head Circumference --   ?   Peak Flow --   ?   Pain Score --   ?   Pain Loc --   ?   Pain Edu? --   ?   Excl. in [place]? --   ? ? ?Most recent vital signs: ?Vitals:  ? 10/15/21 1400  ?BP: (!) 134/116  ?Pulse: (!) 105  ?Resp: 20  ?Temp: (!) 97.4 ?F (36.3 ?C)  ?SpO2: 95%  ? ? ?General: Awake, no distress.  ?CV:  Good peripheral perfusion.  Regular rate and rhythm  ?Resp:  Normal effort.  Equal breath sounds bilaterally.  ?Abd:  No distention.  Soft, nontender.  No rebound or guarding. ?Other:  Patient has equal grip strength bilaterally.  No obvious cranial nerve deficit.  Good strength in bilateral lower extremities, 4+/5 strength in bilateral lower extremities. ? ? ?ED Results / Procedures / Treatments  ? ?EKG ? ?EKG viewed and interpreted by myself shows what appears to be atrial fibrillation versus a paced rhythm at 101 bpm with a widened QRS, left  axis deviation largely normal intervals besides slight QTc prolongation, left bundle branch block.  Electrical interference. ? ?Patient's repeat EKG viewed and interpreted by myself shows atrial ventricular dual paced rhythm at 99 bpm with a widened QRS, no concerning ST changes. ? ?RADIOLOGY ? ?X-ray shows stable cardiomegaly.  Pacemaker in place. ?CT scan head is negative for acute abnormality. ? ?MEDICATIONS ORDERED IN ED: ?Medications - No data to display ? ? ?IMPRESSION / MDM / ASSESSMENT AND PLAN / ED COURSE  ?I reviewed the triage vital signs and the nursing notes. ? ?Patient presents emergency department for generalized weakness since this morning.  No specific complaint besides generalized weakness.  We will check labs including cardiac enzymes, COVID swab, urinalysis.  We will IV hydrate and continue to closely monitor.  EKG possibly shows atrial fibrillation, past EKGs appear to be consistent with AV paced rhythm.  We will repeat an EKG, we will check labs and continue to closely monitor.  Differential this time is quite broad but would include infectious etiology such as COVID, UTI, pneumonia, electrolyte or metabolic abnormality, dehydration.  No focal neurologic deficits to suggest CVA. ? ?Patient's lab work  is overall nonrevealing.  Troponin is negative.  Chemistry is largely at the patient's baseline besides slight renal insufficiency we will dose 500 cc of normal saline.  Patient CBC is normal. ? ?Urinalysis pending, patient care signed out to oncoming provider. ? ?FINAL CLINICAL IMPRESSION(S) / ED DIAGNOSES  ? ?Weakness ? ? ?Note:  This document was prepared using Dragon voice recognition software and may include unintentional dictation errors. ?  Harvest Dark, MD ?10/15/21 1609 ? ?

## 2021-10-15 NOTE — Discharge Instructions (Addendum)
Please seek medical attention for any high fevers, chest pain, shortness of breath, change in behavior, persistent vomiting, bloody stool or any other new or concerning symptoms.  

## 2021-10-15 NOTE — ED Notes (Signed)
Blue, green lavender tubes sent.  ?

## 2021-10-28 ENCOUNTER — Encounter: Payer: Self-pay | Admitting: Podiatry

## 2021-10-28 ENCOUNTER — Ambulatory Visit: Payer: Medicare PPO | Admitting: Podiatry

## 2021-10-28 DIAGNOSIS — B351 Tinea unguium: Secondary | ICD-10-CM

## 2021-10-28 DIAGNOSIS — D689 Coagulation defect, unspecified: Secondary | ICD-10-CM

## 2021-10-28 DIAGNOSIS — M79609 Pain in unspecified limb: Secondary | ICD-10-CM | POA: Diagnosis not present

## 2021-10-28 NOTE — Progress Notes (Signed)
This patient returns to my office for at risk foot care.  This patient requires this care by a professional since this patient will be at risk due to having  Coagulation defect.This patient is unable to cut nails themselves since the patient cannot reach their nails.These nails are painful walking and wearing shoes.  This patient presents for at risk foot care today. ° °General Appearance  Alert, conversant and in no acute stress. ° °Vascular  Dorsalis pedis and posterior tibial  pulses are weakly  palpable  bilaterally.  Capillary return is within normal limits  bilaterally. Temperature is within normal limits  bilaterally. ° °Neurologic  Senn-Weinstein monofilament wire test diminished   bilaterally. Muscle power within normal limits bilaterally. ° °Nails Thick disfigured discolored nails with subungual debris  from hallux to fifth toes bilaterally. No evidence of bacterial infection or drainage bilaterally. ° °Orthopedic  No limitations of motion  feet .  No crepitus or effusions noted.  No bony pathology or digital deformities noted. ° °Skin  normotropic skin with no porokeratosis noted bilaterally.  No signs of infections or ulcers noted.    ° °Onychomycosis  Pain in right toes  Pain in left toes ° °Consent was obtained for treatment procedures.   Mechanical debridement of nails 1-5  bilaterally performed with a nail nipper.  Filed with dremel without incident. No infection or ulcer.   ° ° °Return office visit  3 months        Told patient to return for periodic foot care and evaluation due to potential at risk complications. ° ° °Gemini Beaumier DPM  °

## 2021-11-03 ENCOUNTER — Ambulatory Visit (INDEPENDENT_AMBULATORY_CARE_PROVIDER_SITE_OTHER): Payer: Medicare PPO

## 2021-11-03 DIAGNOSIS — I442 Atrioventricular block, complete: Secondary | ICD-10-CM | POA: Diagnosis not present

## 2021-11-03 LAB — CUP PACEART REMOTE DEVICE CHECK
Battery Remaining Longevity: 57 mo
Battery Remaining Percentage: 56 %
Battery Voltage: 2.98 V
Brady Statistic AP VP Percent: 51 %
Brady Statistic AP VS Percent: 1 %
Brady Statistic AS VP Percent: 47 %
Brady Statistic AS VS Percent: 1 %
Brady Statistic RA Percent Paced: 48 %
Brady Statistic RV Percent Paced: 98 %
Date Time Interrogation Session: 20230412020013
Implantable Lead Implant Date: 20190412
Implantable Lead Implant Date: 20190412
Implantable Lead Location: 753859
Implantable Lead Location: 753860
Implantable Pulse Generator Implant Date: 20190412
Lead Channel Impedance Value: 480 Ohm
Lead Channel Impedance Value: 550 Ohm
Lead Channel Pacing Threshold Amplitude: 0.5 V
Lead Channel Pacing Threshold Amplitude: 0.5 V
Lead Channel Pacing Threshold Pulse Width: 0.5 ms
Lead Channel Pacing Threshold Pulse Width: 0.5 ms
Lead Channel Sensing Intrinsic Amplitude: 2.8 mV
Lead Channel Sensing Intrinsic Amplitude: 8.8 mV
Lead Channel Setting Pacing Amplitude: 2 V
Lead Channel Setting Pacing Amplitude: 2.5 V
Lead Channel Setting Pacing Pulse Width: 0.5 ms
Lead Channel Setting Sensing Sensitivity: 2 mV
Pulse Gen Model: 2272
Pulse Gen Serial Number: 9009871

## 2021-11-09 ENCOUNTER — Encounter: Payer: Self-pay | Admitting: Internal Medicine

## 2021-11-09 ENCOUNTER — Ambulatory Visit (INDEPENDENT_AMBULATORY_CARE_PROVIDER_SITE_OTHER): Payer: Medicare PPO | Admitting: Internal Medicine

## 2021-11-09 VITALS — BP 132/90 | HR 83 | Ht 72.0 in | Wt 175.0 lb

## 2021-11-09 DIAGNOSIS — I480 Paroxysmal atrial fibrillation: Secondary | ICD-10-CM | POA: Diagnosis not present

## 2021-11-09 DIAGNOSIS — I442 Atrioventricular block, complete: Secondary | ICD-10-CM

## 2021-11-09 DIAGNOSIS — Z950 Presence of cardiac pacemaker: Secondary | ICD-10-CM

## 2021-11-09 DIAGNOSIS — Z79899 Other long term (current) drug therapy: Secondary | ICD-10-CM | POA: Diagnosis not present

## 2021-11-09 LAB — CUP PACEART INCLINIC DEVICE CHECK
Battery Remaining Longevity: 54 mo
Battery Voltage: 2.96 V
Brady Statistic RA Percent Paced: 48 %
Brady Statistic RV Percent Paced: 98 %
Date Time Interrogation Session: 20230418171106
Implantable Lead Implant Date: 20190412
Implantable Lead Implant Date: 20190412
Implantable Lead Location: 753859
Implantable Lead Location: 753860
Implantable Pulse Generator Implant Date: 20190412
Lead Channel Impedance Value: 475 Ohm
Lead Channel Impedance Value: 537.5 Ohm
Lead Channel Pacing Threshold Amplitude: 0.5 V
Lead Channel Pacing Threshold Amplitude: 0.5 V
Lead Channel Pacing Threshold Amplitude: 0.75 V
Lead Channel Pacing Threshold Amplitude: 0.75 V
Lead Channel Pacing Threshold Pulse Width: 0.5 ms
Lead Channel Pacing Threshold Pulse Width: 0.5 ms
Lead Channel Pacing Threshold Pulse Width: 0.5 ms
Lead Channel Pacing Threshold Pulse Width: 0.5 ms
Lead Channel Sensing Intrinsic Amplitude: 4.1 mV
Lead Channel Setting Pacing Amplitude: 2 V
Lead Channel Setting Pacing Amplitude: 2.5 V
Lead Channel Setting Pacing Pulse Width: 0.5 ms
Lead Channel Setting Sensing Sensitivity: 2 mV
Pulse Gen Model: 2272
Pulse Gen Serial Number: 9009871

## 2021-11-09 LAB — PACEMAKER DEVICE OBSERVATION

## 2021-11-09 NOTE — Patient Instructions (Signed)
Medication Instructions:  ?- Your physician recommends that you continue on your current medications as directed. Please refer to the Current Medication list given to you today. ? ?*If you need a refill on your cardiac medications before your next appointment, please call your pharmacy* ? ? ?Lab Work: ?- Your physician recommends that you have lab work today: BMP ? ?If you have labs (blood work) drawn today and your tests are completely normal, you will receive your results only by: ?MyChart Message (if you have MyChart) OR ?A paper copy in the mail ?If you have any lab test that is abnormal or we need to change your treatment, we will call you to review the results. ? ? ?Testing/Procedures: ?- none ordered ? ? ?Follow-Up: ?At [facility], you and your health needs are our priority.  As part of our continuing mission to provide you with exceptional heart care, we have created designated Provider Care Teams.  These Care Teams include your primary Cardiologist (physician) and Advanced Practice Providers (APPs -  Physician Assistants and Nurse Practitioners) who all work together to provide you with the care you need, when you need it. ? ?We recommend signing up for the patient portal called "MyChart".  Sign up information is provided on this After Visit Summary.  MyChart is used to connect with patients for Virtual Visits (Telemedicine).  Patients are able to view lab/test results, encounter notes, upcoming appointments, etc.  Non-urgent messages can be sent to your provider as well.   ?To learn more about what you can do with MyChart, go to [email or web address].   ? ?Your next appointment:   ?1 year(s) ? ?The format for your next appointment:   ?In Person ? ?Provider:   ?Virl Axe, MD  ? ? ?Other Instructions ?N/a ? ?Important Information About Sugar ? ? ? ? ? ? ?

## 2021-11-09 NOTE — Progress Notes (Signed)
? ? ? ? ?Patient Care Team: ?Jaclyn Shaggy, MD as PCP - General (Internal Medicine) ?Antonieta Iba, MD as PCP - Cardiology (Cardiology) ? ? ?HPI ? ?Justin Hendrix is a 88 y.o. male ?Seen in follow-up for pacemaker [employer]  implanted 4/19 for complete heart block presenting with syncope ? ?History of a TIA.  Atrial fibrillation detected on his device>>>apixoban.  No bleeding. ?Seen in the ER 4/23 with weakness ?First ECG was difficult to interpret because of artifact.  Irregular.  Possibly atrial fibrillation.  Subsequent ECG demonstrated AV pacing with frequent tracked PACs ? ?The patient denies chest pain, shortness of breath, nocturnal dyspnea, orthopnea or peripheral edema.  There have been no palpitations, lightheadedness or syncope. .  ? ? Lives with his 2 sons  ? ? ? ?DATE TEST EF   ?1/15 Echo   50-55 %   ?4/19 Echo   60-65 %   ?     ? ?Date Cr K Hgb  ?4/19 1.69 4.3 8.7  ?7/19 1.21 4.1 14.1  ?2/20 1.28 4.6 13.7  ?3/23 1.66 3.8 13.0  ? ? ? ?Past Medical History:  ?Diagnosis Date  ? Benign prostatic hypertrophy   ? Chest pain   ? a. 04/2014 Echo: EF 50-55%, mild LVH, nl RV fxn, mild to mod AS, mild AI, mild TR; b. 04/2014 MV: nl EF. No ischemia.  ? Chronic prostatitis   ? Elevated PSA   ? Incomplete bladder emptying   ? Parkinson's disease (HCC)   ? Pre-syncope   ? TIA (transient ischemic attack)   ? Unsteady gait   ? Urinary frequency   ? ? ?Past Surgical History:  ?Procedure Laterality Date  ? INTRAMEDULLARY (IM) NAIL INTERTROCHANTERIC Right 11/04/2017  ? Procedure: INTRAMEDULLARY (IM) NAIL INTERTROCHANTRIC;  Surgeon: Bjorn Pippin, MD;  Location: [facility] OR;  Service: Orthopedics;  Laterality: Right;  ? PACEMAKER IMPLANT N/A 11/03/2017  ? Procedure: PACEMAKER IMPLANT;  Surgeon: Hillis Range, MD;  Location: [facility] INVASIVE [facility];  Service: Cardiovascular;  Laterality: N/A;  ? ? ?Current Meds  ?Medication Sig  ? amantadine (SYMMETREL) 100 MG capsule Take 100 mg by mouth daily.  ? apixaban (ELIQUIS) 5 MG TABS  tablet TAKE 1 TABLET BY MOUTH TWICE DAILY  ? atenolol (TENORMIN) 50 MG tablet   ? budesonide-formoterol (SYMBICORT) 160-4.5 MCG/ACT inhaler Inhale 2 puffs into the lungs 2 (two) times daily as needed (respiratory difficulty).   ? docusate sodium (COLACE) 100 MG capsule Take 1 capsule (100 mg total) by mouth 2 (two) times daily. (Patient taking differently: Take 100 mg by mouth as needed.)  ? finasteride (PROSCAR) 5 MG tablet Take 1 tablet (5 mg total) by mouth daily.  ? fluticasone (FLONASE) 50 MCG/ACT nasal spray 2 sprays by Each Nare route daily. as needed  ? ondansetron (ZOFRAN) 4 MG tablet Take 1 tablet (4 mg total) by mouth every 6 (six) hours as needed for nausea.  ? pantoprazole (PROTONIX) 40 MG tablet Take 40 mg by mouth daily.  ? polyethylene glycol (MIRALAX / GLYCOLAX) packet Take 17 g by mouth daily.  ? senna-docusate (SENOKOT-S) 8.6-50 MG tablet Take 1 tablet by mouth at bedtime.  ? tamsulosin (FLOMAX) 0.4 MG CAPS capsule TAKE 1 CAPSULE BY MOUTH ONCE DAILY 30 MINUTES AFTER LARGEST MEAL.  ? ? ?No Known Allergies ? ? ? ?Review of Systems negative except from HPI and PMH ? ?Physical Exam ?BP 132/90 (BP Location: Right Arm, Patient Position: Sitting, Cuff Size: Normal)   Pulse  83   Ht 6' (1.829 m)   Wt 175 lb (79.4 kg)   SpO2 95%   BMI 23.73 kg/m?  ?Well developed and well nourished in no acute distress ?HENT normal ?Neck supple  ?Clear ?Device pocket well healed; without hematoma or erythema.  There is no tethering  ?Regular rate and rhythm, murmur ?Abd-soft with active BS ?No Clubbing cyanosis  edema ?Skin-warm and dry ?A & Oriented  Grossly normal sensory and motor function ? ?ECG AV pacing at 60 with frequent runs of atrial tachycardia that are tracked ? ? ?Assessment and  Plan ? ?Complete heart block ? ?Falls-infrequent ? ?TIA ? ?Atrial fibrillation ? ?Atrial tachycardia ? ?Pacemaker-[employer]   .    ? ?Renal insufficiency grade 4 ? ?The patient's device was interrogated.  The information was  reviewed. No changes were made in the programming.  Normal device function  ? ?Minimal atrial fibrillation; On Anticoagulation;  No bleeding issues his renal function however has deteriorated; we will recheck his creatinine to see whether he needs to be down titrated. ? ?His atrial tachycardia runs about 100 bpm.  So not withstanding the fact that it comprises about 40% of his beats we will not uptitrate his atenolol which we will continue at 50 mg a day. ? ? ? ? ? ? ?  ? ?

## 2021-11-10 LAB — BASIC METABOLIC PANEL
BUN/Creatinine Ratio: 10 (ref 10–24)
BUN: 15 mg/dL (ref 10–36)
CO2: 21 mmol/L (ref 20–29)
Calcium: 9.3 mg/dL (ref 8.6–10.2)
Chloride: 99 mmol/L (ref 96–106)
Creatinine, Ser: 1.48 mg/dL — ABNORMAL HIGH (ref 0.76–1.27)
Glucose: 96 mg/dL (ref 70–99)
Potassium: 4.9 mmol/L (ref 3.5–5.2)
Sodium: 135 mmol/L (ref 134–144)
eGFR: 43 mL/min/{1.73_m2} — ABNORMAL LOW (ref >59)

## 2021-11-12 ENCOUNTER — Telehealth: Payer: Self-pay | Admitting: Internal Medicine

## 2021-11-12 MED ORDER — APIXABAN 2.5 MG PO TABS: 2.5000 mg | ORAL_TABLET | Freq: Two times a day (BID) | ORAL | 6 refills | Status: DC

## 2021-11-12 NOTE — Telephone Encounter (Signed)
Call back received from the patient's son, Molly Maduro. ?I have discussed the patient's lab results with him from earlier this week and Dr. Odessa Fleming recommendations to: ? ?1) DECREASE eliquis to 2.5 mg BID based on age/ renal function ? ?Robert voices understanding. ? ?I inquired how much eliquis 5 mg tablets the patient currently has left and per Molly Maduro, the patient has ~ 7 days worth. ? ?I have advised Molly Maduro to just have the patient finish out his current supply of eliquis 5 mg BID and I will send in a RX for Eliquis 2.5 mg BID for the patient to start when his current supply is gone.  ? ?Molly Maduro again voices understanding and is agreeable. ? ? ? ? ? ? ?

## 2021-11-12 NOTE — Telephone Encounter (Signed)
Reviewed results further with Dr. Graciela Husbands. ?Patient is currently on eliquis 5 mg BID- creatinine 1.6 (10/15/21) & currently 1.48. ?Per Dr. Graciela Husbands, due to the patient's age and that his renal function is borderline, recommend decreasing Eliquis to 2.5 mg BID. ?  ?Attempted to call the patient. ?No answer- I left a message to please call back. ?  ?

## 2021-11-12 NOTE — Telephone Encounter (Signed)
Deboraha Sprang, MD  ?11/11/2021 10:16 AM EDT   ?  ?Please Inform Patient ?  ?Labs are normal x mild chronic elevation in his creatinine ?  ?Thanks   ? ?

## 2021-11-19 NOTE — Progress Notes (Signed)
Remote pacemaker transmission.   

## 2021-12-23 ENCOUNTER — Encounter: Payer: Self-pay | Admitting: Internal Medicine

## 2022-01-27 ENCOUNTER — Ambulatory Visit: Payer: Medicare PPO | Admitting: Podiatry

## 2022-01-27 ENCOUNTER — Encounter: Payer: Self-pay | Admitting: Podiatry

## 2022-01-27 DIAGNOSIS — M79676 Pain in unspecified toe(s): Secondary | ICD-10-CM

## 2022-01-27 DIAGNOSIS — D689 Coagulation defect, unspecified: Secondary | ICD-10-CM

## 2022-01-27 DIAGNOSIS — B351 Tinea unguium: Secondary | ICD-10-CM

## 2022-01-27 NOTE — Progress Notes (Signed)
This patient returns to my office for at risk foot care.  This patient requires this care by a professional since this patient will be at risk due to having  Coagulation defect.This patient is unable to cut nails themselves since the patient cannot reach their nails.These nails are painful walking and wearing shoes.  This patient presents for at risk foot care today.  General Appearance  Alert, conversant and in no acute stress.  Vascular  Dorsalis pedis and posterior tibial  pulses are weakly  palpable  bilaterally.  Capillary return is within normal limits  bilaterally. Temperature is within normal limits  bilaterally.  Neurologic  Senn-Weinstein monofilament wire test diminished   bilaterally. Muscle power within normal limits bilaterally.  Nails Thick disfigured discolored nails with subungual debris  from hallux to fifth toes bilaterally. No evidence of bacterial infection or drainage bilaterally.  Orthopedic  No limitations of motion  feet .  No crepitus or effusions noted.  No bony pathology or digital deformities noted.  Skin  normotropic skin with no porokeratosis noted bilaterally.  No signs of infections or ulcers noted.     Onychomycosis  Pain in right toes  Pain in left toes  Consent was obtained for treatment procedures.   Mechanical debridement of nails 1-5  bilaterally performed with a nail nipper.  Filed with dremel without incident. No infection or ulcer.  Padding dispensed for second toe right foot due to skin injury.   Return office visit  3 months        Told patient to return for periodic foot care and evaluation due to potential at risk complications.   Helane Gunther DPM

## 2022-02-02 ENCOUNTER — Ambulatory Visit (INDEPENDENT_AMBULATORY_CARE_PROVIDER_SITE_OTHER): Payer: Medicare PPO

## 2022-02-02 DIAGNOSIS — I442 Atrioventricular block, complete: Secondary | ICD-10-CM

## 2022-02-02 LAB — CUP PACEART REMOTE DEVICE CHECK
Battery Remaining Longevity: 52 mo
Battery Remaining Percentage: 53 %
Battery Voltage: 2.98 V
Brady Statistic AP VP Percent: 50 %
Brady Statistic AP VS Percent: 1 %
Brady Statistic AS VP Percent: 48 %
Brady Statistic AS VS Percent: 1 %
Brady Statistic RA Percent Paced: 48 %
Brady Statistic RV Percent Paced: 98 %
Date Time Interrogation Session: 20230712034204
Implantable Lead Implant Date: 20190412
Implantable Lead Implant Date: 20190412
Implantable Lead Location: 753859
Implantable Lead Location: 753860
Implantable Pulse Generator Implant Date: 20190412
Lead Channel Impedance Value: 440 Ohm
Lead Channel Impedance Value: 460 Ohm
Lead Channel Pacing Threshold Amplitude: 0.5 V
Lead Channel Pacing Threshold Amplitude: 0.75 V
Lead Channel Pacing Threshold Pulse Width: 0.5 ms
Lead Channel Pacing Threshold Pulse Width: 0.5 ms
Lead Channel Sensing Intrinsic Amplitude: 0.8 mV
Lead Channel Sensing Intrinsic Amplitude: 4.3 mV
Lead Channel Setting Pacing Amplitude: 2 V
Lead Channel Setting Pacing Amplitude: 2.5 V
Lead Channel Setting Pacing Pulse Width: 0.5 ms
Lead Channel Setting Sensing Sensitivity: 2 mV
Pulse Gen Model: 2272
Pulse Gen Serial Number: 9009871

## 2022-02-22 NOTE — Progress Notes (Signed)
Remote pacemaker transmission.   

## 2022-03-11 ENCOUNTER — Ambulatory Visit: Payer: Medicare PPO | Admitting: Urology

## 2022-03-16 ENCOUNTER — Ambulatory Visit: Payer: Medicare PPO | Admitting: Urology

## 2022-03-24 ENCOUNTER — Other Ambulatory Visit: Payer: Self-pay | Admitting: Urology

## 2022-04-03 ENCOUNTER — Encounter: Payer: Self-pay | Admitting: Emergency Medicine

## 2022-04-03 ENCOUNTER — Observation Stay
Admission: EM | Admit: 2022-04-03 | Discharge: 2022-04-04 | Disposition: A | Discharge status: Home Health | Payer: Medicare PPO | Attending: Internal Medicine | Admitting: Internal Medicine

## 2022-04-03 ENCOUNTER — Other Ambulatory Visit: Payer: Self-pay

## 2022-04-03 ENCOUNTER — Emergency Department: Payer: Medicare PPO

## 2022-04-03 DIAGNOSIS — Z8673 Personal history of transient ischemic attack (TIA), and cerebral infarction without residual deficits: Secondary | ICD-10-CM | POA: Diagnosis not present

## 2022-04-03 DIAGNOSIS — G20 Parkinson's disease: Secondary | ICD-10-CM | POA: Diagnosis not present

## 2022-04-03 DIAGNOSIS — K219 Gastro-esophageal reflux disease without esophagitis: Secondary | ICD-10-CM | POA: Diagnosis not present

## 2022-04-03 DIAGNOSIS — Z950 Presence of cardiac pacemaker: Secondary | ICD-10-CM | POA: Insufficient documentation

## 2022-04-03 DIAGNOSIS — R55 Syncope and collapse: Principal | ICD-10-CM | POA: Insufficient documentation

## 2022-04-03 DIAGNOSIS — I480 Paroxysmal atrial fibrillation: Secondary | ICD-10-CM

## 2022-04-03 DIAGNOSIS — Z87891 Personal history of nicotine dependence: Secondary | ICD-10-CM | POA: Diagnosis not present

## 2022-04-03 DIAGNOSIS — F32A Depression, unspecified: Secondary | ICD-10-CM | POA: Diagnosis not present

## 2022-04-03 DIAGNOSIS — N401 Enlarged prostate with lower urinary tract symptoms: Secondary | ICD-10-CM

## 2022-04-03 HISTORY — DX: Presence of cardiac pacemaker: Z95.0

## 2022-04-03 LAB — COMPREHENSIVE METABOLIC PANEL
ALT: 11 U/L (ref 0–44)
AST: 39 U/L (ref 15–41)
Albumin: 2.8 g/dL — ABNORMAL LOW (ref 3.5–5.0)
Alkaline Phosphatase: 140 U/L — ABNORMAL HIGH (ref 38–126)
Anion gap: 8 (ref 5–15)
BUN: 25 mg/dL — ABNORMAL HIGH (ref 8–23)
CO2: 24 mmol/L (ref 22–32)
Calcium: 7.8 mg/dL — ABNORMAL LOW (ref 8.9–10.3)
Chloride: 105 mmol/L (ref 98–111)
Creatinine, Ser: 1.84 mg/dL — ABNORMAL HIGH (ref 0.61–1.24)
GFR, Estimated: 33 mL/min — ABNORMAL LOW (ref >60)
Glucose, Bld: 110 mg/dL — ABNORMAL HIGH (ref 70–99)
Potassium: 3.6 mmol/L (ref 3.5–5.1)
Sodium: 137 mmol/L (ref 135–145)
Total Bilirubin: 0.6 mg/dL (ref 0.3–1.2)
Total Protein: 5.6 g/dL — ABNORMAL LOW (ref 6.5–8.1)

## 2022-04-03 LAB — LACTIC ACID, PLASMA
Lactic Acid, Venous: 2.9 mmol/L (ref 0.5–1.9)
Lactic Acid, Venous: 1.4 mmol/L (ref 0.5–1.9)

## 2022-04-03 LAB — CBC WITH DIFFERENTIAL/PLATELET
Abs Immature Granulocytes: 0.01 10*3/uL (ref 0.00–0.07)
Basophils Absolute: 0.1 10*3/uL (ref 0.0–0.1)
Basophils Relative: 2 %
Eosinophils Absolute: 0 10*3/uL (ref 0.0–0.5)
Eosinophils Relative: 0 %
HCT: 39.8 % (ref 39.0–52.0)
Hemoglobin: 12.5 g/dL — ABNORMAL LOW (ref 13.0–17.0)
Immature Granulocytes: 0 %
Lymphocytes Relative: 41 %
Lymphs Abs: 2 10*3/uL (ref 0.7–4.0)
MCH: 28.5 pg (ref 26.0–34.0)
MCHC: 31.4 g/dL (ref 30.0–36.0)
MCV: 90.7 fL (ref 80.0–100.0)
Monocytes Absolute: 0.4 10*3/uL (ref 0.1–1.0)
Monocytes Relative: 7 %
Neutro Abs: 2.5 10*3/uL (ref 1.7–7.7)
Neutrophils Relative %: 50 %
Platelets: 167 10*3/uL (ref 150–400)
RBC: 4.39 MIL/uL (ref 4.22–5.81)
RDW: 17.2 % — ABNORMAL HIGH (ref 11.5–15.5)
WBC: 5 10*3/uL (ref 4.0–10.5)
nRBC: 0 % (ref 0.0–0.2)

## 2022-04-03 LAB — CK: Total CK: 98 U/L (ref 49–397)

## 2022-04-03 LAB — TROPONIN I (HIGH SENSITIVITY)
Troponin I (High Sensitivity): 29 ng/L — ABNORMAL HIGH (ref <18)
Troponin I (High Sensitivity): 29 ng/L — ABNORMAL HIGH (ref <18)

## 2022-04-03 MED ORDER — FINASTERIDE 5 MG PO TABS
5.0000 mg | ORAL_TABLET | Freq: Every day | ORAL | Status: DC
  Administered 2022-04-04: 5 mg via ORAL
  Filled 2022-04-03: qty 1

## 2022-04-03 MED ORDER — ONDANSETRON HCL 4 MG PO TABS: 4.0000 mg | ORAL_TABLET | Freq: Four times a day (QID) | ORAL | Status: DC | PRN

## 2022-04-03 MED ORDER — ATENOLOL 25 MG PO TABS
50.0000 mg | ORAL_TABLET | Freq: Every day | ORAL | Status: DC
  Administered 2022-04-04: 50 mg via ORAL
  Filled 2022-04-03: qty 2

## 2022-04-03 MED ORDER — APIXABAN 2.5 MG PO TABS
2.5000 mg | ORAL_TABLET | Freq: Two times a day (BID) | ORAL | Status: DC
  Administered 2022-04-03 – 2022-04-04 (×2): 2.5 mg via ORAL
  Filled 2022-04-03 (×2): qty 1

## 2022-04-03 MED ORDER — ACETAMINOPHEN 325 MG PO TABS: 650.0000 mg | ORAL_TABLET | Freq: Four times a day (QID) | ORAL | Status: DC | PRN

## 2022-04-03 MED ORDER — SODIUM CHLORIDE 0.9% FLUSH: 3.0000 mL | Freq: Two times a day (BID) | INTRAVENOUS | Status: DC

## 2022-04-03 MED ORDER — SENNOSIDES-DOCUSATE SODIUM 8.6-50 MG PO TABS: 1.0000 | ORAL_TABLET | Freq: Every day | ORAL | Status: DC

## 2022-04-03 MED ORDER — ONDANSETRON HCL 4 MG/2ML IJ SOLN: 4.0000 mg | Freq: Four times a day (QID) | INTRAMUSCULAR | Status: DC | PRN

## 2022-04-03 MED ORDER — SODIUM CHLORIDE 0.9 % IV BOLUS
1000.0000 mL | Freq: Once | INTRAVENOUS | Status: AC
  Administered 2022-04-03: 1000 mL via INTRAVENOUS

## 2022-04-03 MED ORDER — FLUTICASONE PROPIONATE 50 MCG/ACT NA SUSP: 2.0000 | Freq: Every day | NASAL | Status: DC | PRN

## 2022-04-03 MED ORDER — POTASSIUM CHLORIDE IN NACL 20-0.9 MEQ/L-% IV SOLN
INTRAVENOUS | Status: DC
  Filled 2022-04-03 (×2): qty 1000

## 2022-04-03 MED ORDER — MAGNESIUM HYDROXIDE 400 MG/5ML PO SUSP: 30.0000 mL | Freq: Every day | ORAL | Status: DC | PRN

## 2022-04-03 MED ORDER — ACETAMINOPHEN 650 MG RE SUPP: 650.0000 mg | Freq: Four times a day (QID) | RECTAL | Status: DC | PRN

## 2022-04-03 MED ORDER — SODIUM CHLORIDE 0.9 % IV BOLUS
500.0000 mL | Freq: Once | INTRAVENOUS | Status: AC
  Administered 2022-04-03: 500 mL via INTRAVENOUS

## 2022-04-03 MED ORDER — POLYETHYLENE GLYCOL 3350 17 G PO PACK
17.0000 g | PACK | Freq: Every day | ORAL | Status: DC
  Filled 2022-04-03: qty 1

## 2022-04-03 MED ORDER — TAMSULOSIN HCL 0.4 MG PO CAPS: 0.4000 mg | ORAL_CAPSULE | Freq: Every day | ORAL | Status: DC

## 2022-04-03 MED ORDER — PANTOPRAZOLE SODIUM 40 MG PO TBEC
40.0000 mg | DELAYED_RELEASE_TABLET | Freq: Every day | ORAL | Status: DC
  Administered 2022-04-04: 40 mg via ORAL
  Filled 2022-04-03: qty 1

## 2022-04-03 MED ORDER — TRAZODONE HCL 50 MG PO TABS: 25.0000 mg | ORAL_TABLET | Freq: Every evening | ORAL | Status: DC | PRN

## 2022-04-03 MED ORDER — AMANTADINE HCL 100 MG PO CAPS
100.0000 mg | ORAL_CAPSULE | Freq: Every day | ORAL | Status: DC
  Administered 2022-04-04: 100 mg via ORAL
  Filled 2022-04-03: qty 1

## 2022-04-03 NOTE — Assessment & Plan Note (Signed)
-   We will continue PPI therapy 

## 2022-04-03 NOTE — ED Provider Notes (Signed)
[facility] Provider Note    Event Date/Time   First MD Initiated Contact with Patient 04/03/22 1530     (approximate)   History   Loss of Consciousness   HPI  Justin Hendrix is a 88 y.o. male with a history of Parkinson's disease who presents with an episode of syncope while on the toilet.  EMS states that the family reported the patient has been weak and somewhat dehydrated for the last few weeks.  He did fall off the toilet but is unclear if he hit his head.  The patient states he feels funny but denies any other focal complaints and does not know why he is in the hospital.  He denies any acute pain.    Physical Exam   Triage Vital Signs: ED Triage Vitals [04/03/22 1530]  Enc Vitals Group     BP      Pulse      Resp      Temp      Temp src      SpO2      Weight 212 lb (96.2 kg)     Height 6' (1.829 m)     Head Circumference      Peak Flow      Pain Score 0     Pain Loc      Pain Edu?      Excl. in GC?     Most recent vital signs: Vitals:   04/03/22 1830 04/03/22 1900  BP: (!) 150/89 (!) 116/90  Pulse: 60 (!) 118  Resp: (!) 27 15  Temp:    SpO2: 98% 98%     General: Alert and oriented, frail appearing but in no acute distress. CV:  Good peripheral perfusion.  Normal heart sounds. Resp:  Normal effort.  Lungs CTAB. Abd:  Soft and nontender.  No distention.  Other:  EOMI.  PERRLA.  Motor intact in all extremities.  No peripheral edema.  Very dry mucous membranes.   ED Results / Procedures / Treatments   Labs (all labs ordered are listed, but only abnormal results are displayed) Labs Reviewed  COMPREHENSIVE METABOLIC PANEL - Abnormal; Notable for the following components:      Result Value   Glucose, Bld 110 (*)    BUN 25 (*)    Creatinine, Ser 1.84 (*)    Calcium 7.8 (*)    Total Protein 5.6 (*)    Albumin 2.8 (*)    Alkaline Phosphatase 140 (*)    GFR, Estimated 33 (*)    All other components within normal limits   CBC WITH DIFFERENTIAL/PLATELET - Abnormal; Notable for the following components:   Hemoglobin 12.5 (*)    RDW 17.2 (*)    All other components within normal limits  LACTIC ACID, PLASMA - Abnormal; Notable for the following components:   Lactic Acid, Venous 2.9 (*)    All other components within normal limits  TROPONIN I (HIGH SENSITIVITY) - Abnormal; Notable for the following components:   Troponin I (High Sensitivity) 29 (*)    All other components within normal limits  TROPONIN I (HIGH SENSITIVITY) - Abnormal; Notable for the following components:   Troponin I (High Sensitivity) 29 (*)    All other components within normal limits  LACTIC ACID, PLASMA  CK  URINALYSIS, ROUTINE W REFLEX MICROSCOPIC     EKG  ED ECG REPORT I, Dionne Bucy, the attending physician, personally viewed and interpreted this ECG.  Date: 04/03/2022 EKG Time:  1538 Rate: 72 Rhythm: Wide-complex, possible paced rhythm Intervals: LBBB morphology ST/T Wave abnormalities: normal Narrative Interpretation: no evidence of acute ischemia; no significant change when compared to EKG of 1423    RADIOLOGY  CT head: I independently viewed and interpreted the images; there is no ICH or other abnormality  Chest x-ray: I independently viewed and interpreted the images; there is no focal consolidation or edema  PROCEDURES:  Critical Care performed: No  Procedures   MEDICATIONS ORDERED IN ED: Medications  sodium chloride 0.9 % bolus 500 mL (500 mLs Intravenous New Bag/Given 04/03/22 1554)  sodium chloride 0.9 % bolus 1,000 mL (1,000 mLs Intravenous New Bag/Given 04/03/22 1721)     IMPRESSION / MDM / ASSESSMENT AND PLAN / ED COURSE  I reviewed the triage vital signs and the nursing notes.  88 year old male with PMH as noted above presents with generalized weakness over the last few weeks with an acute syncopal episode today.  I reviewed the past medical records.  The patient has no recent  admissions.  Cardiology note from 08/04/2021 confirms that the patient is a pacemaker.  He follows with urology for BPH and was last seen a year ago.  Currently, exam is significant for very dry mucous membranes but otherwise unremarkable.  Neurologic exam is nonfocal.  Differential diagnosis includes, but is not limited to, dehydration, electrolyte abnormality, hypovolemia, other metabolic disturbance, vasovagal syncope, cardiac arrhythmia or other primary cardiac cause, acute infection/sepsis, CNS cause.  Patient's presentation is most consistent with acute presentation with potential threat to life or bodily function.  The patient is on the cardiac monitor to evaluate for evidence of arrhythmia and/or significant heart rate changes.  ----------------------------------------- 4:56 PM on 04/03/2022 -----------------------------------------  Initial lactate is elevated, however the patient currently does not meet sepsis criteria with normal vital signs, no fever, and no leukocytosis.  He has no focal symptoms to suggest a source of infection.  The elevated lactate may be due to dehydration.  I have ordered additional fluids.  ----------------------------------------- 8:08 PM on 04/03/2022 -----------------------------------------  Lactate cleared with fluids.  Initial and repeat troponin was minimally elevated with no significant change.  Urinalysis is pending.  Given the weakness, dehydration, and syncope I recommended admission.  I consulted Dr. Arville Care from the hospitalist service; based on our discussion he agrees to admit the patient.   FINAL CLINICAL IMPRESSION(S) / ED DIAGNOSES   Final diagnoses:  Syncope, unspecified syncope type     Rx / DC Orders   ED Discharge Orders     None        Note:  This document was prepared using Dragon voice recognition software and may include unintentional dictation errors.    Dionne Bucy, MD 04/03/22 2009

## 2022-04-03 NOTE — ED Notes (Signed)
Dr. Siadecki at bedside for reevaluation.  

## 2022-04-03 NOTE — Assessment & Plan Note (Signed)
-   The patient will be continued on Eliquis and will continue atenolol.

## 2022-04-03 NOTE — ED Notes (Signed)
Dillin Lofgren (son) states he is stepping out at this time to go home and come back  [phone]

## 2022-04-03 NOTE — Assessment & Plan Note (Signed)
-   We will continue Flomax and Proscar. 

## 2022-04-03 NOTE — H&P (Signed)
[place]   PATIENT NAME: Justin Hendrix    MR#:  [id number]  DATE OF BIRTH:  08-12-1922  DATE OF ADMISSION:  04/03/2022  PRIMARY CARE PHYSICIAN: Jaclyn Shaggy, MD   Patient is coming from: Home  REQUESTING/REFERRING PHYSICIAN: Miki Kins, MD  CHIEF COMPLAINT:   Chief Complaint  Patient presents with   Loss of Consciousness    HISTORY OF PRESENT ILLNESS:  Justin Hendrix is a 88 y.o. male with medical history significant for Parkinson's disease, TIA and BPH who presented to the emergency room with acute onset of syncope.  The patient was having diarrhea and was sitting on the toilet when this happened.  Is been feeling weak and dehydrated over the last few weeks.  No fever or chills.  No nausea or vomiting.  No dysuria, oliguria or hematuria or flank pain.  No chest pain or palpitations but no cough or wheezing or hemoptysis.  ED Course: When he came to the ER vital signs were within normal.  Labs revealed a BUN of 25 and creatinine 1.84 compared to 15/1.48 on 11/09/2021.  High sensitive troponin was 29 and CBC was within normal. EKG as reviewed by me : EKG showed sinus rhythm rate of 72 with left bundle branch block Imaging: Portable chest ray showed no acute cardiopulmonary disease. Noncontrasted CT scan revealed atrophy and chronic small vessel white matter ischemic changes and remote lacunar infarcts with no evidence for acute intracranial abnormalities.  The patient was given 1.5 L of IV normal saline bolus with he will be admitted to a medical telemetry observation bed for further evaluation and management. PAST MEDICAL HISTORY:   Past Medical History:  Diagnosis Date   Benign prostatic hypertrophy    Chest pain    a. 04/2014 Echo: EF 50-55%, mild LVH, nl RV fxn, mild to mod AS, mild AI, mild TR; b. 04/2014 MV: nl EF. No ischemia.   Chronic prostatitis    Elevated PSA    Incomplete bladder emptying    Parkinson's disease (HCC)    Pre-syncope     Presence of permanent cardiac pacemaker    TIA (transient ischemic attack)    Unsteady gait    Urinary frequency     PAST SURGICAL HISTORY:   Past Surgical History:  Procedure Laterality Date   INTRAMEDULLARY (IM) NAIL INTERTROCHANTERIC Right 11/04/2017   Procedure: INTRAMEDULLARY (IM) NAIL INTERTROCHANTRIC;  Surgeon: Bjorn Pippin, MD;  Location: [facility] OR;  Service: Orthopedics;  Laterality: Right;   PACEMAKER IMPLANT N/A 11/03/2017   Procedure: PACEMAKER IMPLANT;  Surgeon: Hillis Range, MD;  Location: [facility] INVASIVE CV LAB;  Service: Cardiovascular;  Laterality: N/A;    SOCIAL HISTORY:   Social History   Tobacco Use   Smoking status: Former   Smokeless tobacco: Current    Types: Chew   Tobacco comments:    smoked for a few yrs while in the [employer]  Substance Use Topics   Alcohol use: No    FAMILY HISTORY:   Family History  Problem Relation Age of Onset   Hypertension Father     DRUG ALLERGIES:  No Known Allergies  REVIEW OF SYSTEMS:   ROS As per history of present illness. All pertinent systems were reviewed above. Constitutional, HEENT, cardiovascular, respiratory, GI, GU, musculoskeletal, neuro, psychiatric, endocrine, integumentary and hematologic systems were reviewed and are otherwise negative/unremarkable except for positive findings mentioned above in the HPI.   MEDICATIONS AT HOME:   Prior to Admission medications  Medication Sig Start Date End Date Taking? Authorizing Provider  amantadine (SYMMETREL) 100 MG capsule Take 100 mg by mouth daily.    [provider]  apixaban (ELIQUIS) 2.5 MG TABS tablet Take 1 tablet (2.5 mg total) by mouth 2 (two) times daily. 11/12/21   Duke Salvia, MD  atenolol (TENORMIN) 50 MG tablet Take 1 tablet (50 mg) by mouth once daily    [provider]  budesonide-formoterol (SYMBICORT) 160-4.5 MCG/ACT inhaler Inhale 2 puffs into the lungs 2 (two) times daily as needed (respiratory difficulty).     [provider]  docusate sodium (COLACE) 100 MG capsule Take 1 capsule (100 mg total) by mouth 2 (two) times daily. Patient taking differently: Take 100 mg by mouth as needed. 11/03/17   Altamese Dilling, MD  finasteride (PROSCAR) 5 MG tablet Take 1 tablet (5 mg total) by mouth daily. 03/10/21   Stoioff, Verna Czech, MD  fluticasone (FLONASE) 50 MCG/ACT nasal spray 2 sprays by Each Nare route daily. as needed    [provider]  ondansetron (ZOFRAN) 4 MG tablet Take 1 tablet (4 mg total) by mouth every 6 (six) hours as needed for nausea. 11/03/17   Altamese Dilling, MD  pantoprazole (PROTONIX) 40 MG tablet Take 40 mg by mouth daily.    [provider]  polyethylene glycol (MIRALAX / GLYCOLAX) packet Take 17 g by mouth daily. 11/08/17   Rolly Salter, MD  senna-docusate (SENOKOT-S) 8.6-50 MG tablet Take 1 tablet by mouth at bedtime. 11/07/17   Rolly Salter, MD  sertraline (ZOLOFT) 100 MG tablet Take 100 mg by mouth daily.  Patient not taking: Reported on 11/09/2021 05/09/16   [provider]  tamsulosin (FLOMAX) 0.4 MG CAPS capsule TAKE 1 CAPSULE BY MOUTH ONCE DAILY 30 MINUTES AFTER LARGEST MEAL. 03/10/21   Stoioff, Verna Czech, MD      VITAL SIGNS:  Blood pressure (!) 151/89, pulse 60, temperature 97.8 F (36.6 C), resp. rate 20, height 6' (1.829 m), weight 74.7 kg, SpO2 100 %.  PHYSICAL EXAMINATION:  Physical Exam  GENERAL:  88 y.o.-year-old Caucasian male patient lying in the bed with no acute distress.  EYES: Pupils equal, round, reactive to light and accommodation. No scleral icterus. Extraocular muscles intact.  HEENT: Head atraumatic, normocephalic. Oropharynx and nasopharynx clear.  NECK:  Supple, no jugular venous distention. No thyroid enlargement, no tenderness.  LUNGS: Normal breath sounds bilaterally, no wheezing, rales,rhonchi or crepitation. No use of accessory muscles of respiration.  CARDIOVASCULAR: Regular rate and rhythm, S1, S2 normal. No  murmurs, rubs, or gallops.  ABDOMEN: Soft, nondistended, nontender. Bowel sounds present. No organomegaly or mass.  EXTREMITIES: No pedal edema, cyanosis, or clubbing.  NEUROLOGIC: Cranial nerves II through XII are intact. Muscle strength 5/5 in all extremities. Sensation intact. Gait not checked.  PSYCHIATRIC: The patient is alert and oriented x 3.  Normal affect and good eye contact. SKIN: No obvious rash, lesion, or ulcer.   LABORATORY PANEL:   CBC Recent Labs  Lab 04/03/22 1531  WBC 5.0  HGB 12.5*  HCT 39.8  PLT 167   ------------------------------------------------------------------------------------------------------------------  Chemistries  Recent Labs  Lab 04/03/22 1531  NA 137  K 3.6  CL 105  CO2 24  GLUCOSE 110*  BUN 25*  CREATININE 1.84*  CALCIUM 7.8*  AST 39  ALT 11  ALKPHOS 140*  BILITOT 0.6   ------------------------------------------------------------------------------------------------------------------  Cardiac Enzymes No results for input(s): "TROPONINI" in the last 168 hours. ------------------------------------------------------------------------------------------------------------------  RADIOLOGY:  DG Chest Port 1 View  Result Date: 04/03/2022 CLINICAL DATA:  Syncope EXAM: PORTABLE CHEST 1 VIEW COMPARISON:  10/15/2021 FINDINGS: Left-sided implanted cardiac device. Stable mild cardiomegaly. Probable hiatal hernia. Aortic atherosclerosis. No focal airspace consolidation, pleural effusion, or pneumothorax. IMPRESSION: No active disease. Electronically Signed   By: Duanne Guess D.O.   On: 04/03/2022 17:12   CT Head Wo Contrast  Result Date: 04/03/2022 CLINICAL DATA:  88 year old male with altered mental status and syncope. EXAM: CT HEAD WITHOUT CONTRAST TECHNIQUE: Contiguous axial images were obtained from the base of the skull through the vertex without intravenous contrast. RADIATION DOSE REDUCTION: This exam was performed according to the  departmental dose-optimization program which includes automated exposure control, adjustment of the mA and/or kV according to patient size and/or use of iterative reconstruction technique. COMPARISON:  10/15/2021 and prior CTs FINDINGS: Brain: No evidence of acute infarction, hemorrhage, hydrocephalus, extra-axial collection or mass lesion/mass effect. Atrophy, chronic small-vessel white matter ischemic changes and remote basal ganglia and thalamic lacunar infarcts again noted. Vascular: Carotid and vertebral atherosclerotic calcifications are noted. Skull: Normal. Negative for fracture or focal lesion. Sinuses/Orbits: No acute finding. Other: None. IMPRESSION: 1. No evidence of acute intracranial abnormality. 2. Atrophy, chronic small-vessel white matter ischemic changes and remote lacunar infarcts. Electronically Signed   By: Harmon Pier M.D.   On: 04/03/2022 16:56      IMPRESSION AND PLAN:  Assessment and Plan: * Syncope - The patient will be admitted to an observation medically monitored bed. - Will check orthostatics q 12 hours. - The patient will be gently hydrated with IV normal saline and monitored for arrhythmias. -Differential diagnoses would include neurally mediated syncope, cardiogenic, arrhythmias related,  orthostatic hypotension and less likely hypoglycemia. -I highly suspect orthostatic hypotension from dehydration and volume depletion as he was indeed orthostatic when he went to the floor, therefore I will hold off on any further investigations..   Depression - We will continue his Zoloft.  GERD without esophagitis - We will continue PPI therapy.  Paroxysmal atrial fibrillation (HCC) - The patient will be continued on Eliquis and will continue atenolol.  Benign prostatic hyperplasia with lower urinary tract symptoms - We will continue Flomax and Proscar.       DVT prophylaxis: Eliquis. Advanced Care Planning:  Code Status: He. is DNR/DNI. Family Communication:  The  plan of care was discussed in details with the patient (and family). I answered all questions. The patient agreed to proceed with the above mentioned plan. Further management will depend upon hospital course. Disposition Plan: Back to previous home environment Consults called: none.  All the records are reviewed and case discussed with ED provider.  Status is: Observation   I certify that at the time of admission, it is my clinical judgment that the patient will require hospital care extending less than 2 midnights.                            Dispo: The patient is from: Home              Anticipated d/c is to: Home              Patient currently is not medically stable to d/c.              Difficult to place patient: No  Hannah Beat M.D on 04/04/2022 at 12:00 AM  [facility]   From 7 PM-7 AM, contact night-coverage  [email or web address]  CC: Primary care physician; Jaclyn Shaggy, MD

## 2022-04-03 NOTE — ED Notes (Signed)
Pt transported to CT ?

## 2022-04-03 NOTE — Assessment & Plan Note (Signed)
-   The patient will be admitted to an observation medically monitored bed. - Will check orthostatics q 12 hours. - The patient will be gently hydrated with IV normal saline and monitored for arrhythmias. -Differential diagnoses would include neurally mediated syncope, cardiogenic, arrhythmias related,  orthostatic hypotension and less likely hypoglycemia. -I highly suspect orthostatic hypotension from dehydration and volume depletion as he was indeed orthostatic when he went to the floor, therefore I will hold off on any further investigations.Marland Kitchen

## 2022-04-03 NOTE — ED Triage Notes (Signed)
Pt via EMS from home. Pt was on the toilet. Pt had a syncopal episode on the toilet. States that has been having some dehydration for the past few weeks. Pt did fall off of the toilet, pt has a pacemaker and is on Eliquis. Pt is A&Ox4 and NAD. No complaints.   91/60 BP 19 ETCO2 130 CBG  98.6 oral

## 2022-04-03 NOTE — ED Notes (Signed)
Pt presents to the ED for syncopal episode while on the toilet today. States that he has been dehydrated for the past week or so. Unknown how long he has been unconscious. Pt has no complaints. On arrival, pt is covered in dried feces. When asked who takes care of him, pt states "myself." Pt is A&Ox4 and NAD. Pt does have a pace maker and is on Eliquis

## 2022-04-04 ENCOUNTER — Encounter: Payer: Self-pay | Admitting: Family Medicine

## 2022-04-04 DIAGNOSIS — R55 Syncope and collapse: Secondary | ICD-10-CM | POA: Diagnosis not present

## 2022-04-04 DIAGNOSIS — I480 Paroxysmal atrial fibrillation: Secondary | ICD-10-CM

## 2022-04-04 DIAGNOSIS — N401 Enlarged prostate with lower urinary tract symptoms: Secondary | ICD-10-CM

## 2022-04-04 DIAGNOSIS — K219 Gastro-esophageal reflux disease without esophagitis: Secondary | ICD-10-CM

## 2022-04-04 DIAGNOSIS — F32A Depression, unspecified: Secondary | ICD-10-CM

## 2022-04-04 LAB — BASIC METABOLIC PANEL
Anion gap: 5 (ref 5–15)
BUN: 21 mg/dL (ref 8–23)
CO2: 24 mmol/L (ref 22–32)
Calcium: 7.6 mg/dL — ABNORMAL LOW (ref 8.9–10.3)
Chloride: 110 mmol/L (ref 98–111)
Creatinine, Ser: 1.32 mg/dL — ABNORMAL HIGH (ref 0.61–1.24)
GFR, Estimated: 49 mL/min — ABNORMAL LOW (ref >60)
Glucose, Bld: 110 mg/dL — ABNORMAL HIGH (ref 70–99)
Potassium: 3.8 mmol/L (ref 3.5–5.1)
Sodium: 139 mmol/L (ref 135–145)

## 2022-04-04 LAB — CBC
HCT: 38.8 % — ABNORMAL LOW (ref 39.0–52.0)
Hemoglobin: 12.5 g/dL — ABNORMAL LOW (ref 13.0–17.0)
MCH: 28.6 pg (ref 26.0–34.0)
MCHC: 32.2 g/dL (ref 30.0–36.0)
MCV: 88.8 fL (ref 80.0–100.0)
Platelets: 153 10*3/uL (ref 150–400)
RBC: 4.37 MIL/uL (ref 4.22–5.81)
RDW: 17.1 % — ABNORMAL HIGH (ref 11.5–15.5)
WBC: 4.9 10*3/uL (ref 4.0–10.5)
nRBC: 0.4 % — ABNORMAL HIGH (ref 0.0–0.2)

## 2022-04-04 LAB — GLUCOSE, CAPILLARY
Glucose-Capillary: 54 mg/dL — ABNORMAL LOW (ref 70–99)
Glucose-Capillary: 61 mg/dL — ABNORMAL LOW (ref 70–99)
Glucose-Capillary: 84 mg/dL (ref 70–99)

## 2022-04-04 MED ORDER — PNEUMOCOCCAL 20-VAL CONJ VACC 0.5 ML IM SUSY: 0.5000 mL | PREFILLED_SYRINGE | INTRAMUSCULAR | Status: DC

## 2022-04-04 NOTE — Assessment & Plan Note (Signed)
-   We will continue his Zoloft. 

## 2022-04-04 NOTE — Progress Notes (Signed)
   04/04/22 1000  Clinical Encounter Type  Visited With Patient  Visit Type Initial  Referral From Nurse  Consult/Referral To Chaplain   Chaplain responded to [facility] consult to provide information on HCPOA. Info given along with Chaplain's contact information to help complete form when ready.

## 2022-04-04 NOTE — Progress Notes (Signed)
   04/04/22 1300  Clinical Encounter Type  Visited With Patient and family together  Visit Type Follow-up  Referral From Nurse  Consult/Referral To Chaplain   Chaplain responded to call to give follow up information on HCPOA.

## 2022-04-04 NOTE — Progress Notes (Signed)
Hypoglycemic Event  CBG: 54   Treatment: 8 oz juice/soda  Symptoms: None  Follow-up CBG: Time:453 CBG Result:61 Treatment: 4oz juice  Time: 523         CBG result  84  Possible Reasons for Event: Inadequate meal intake  Comments/MD notified:n/a    Justin Hendrix

## 2022-04-04 NOTE — Evaluation (Signed)
Occupational Therapy Evaluation Patient Details Name: Justin Hendrix MRN: [id number] DOB: 1923-01-31 Today's Date: 04/04/2022   History of Present Illness 88 y.o. male with medical history significant for Parkinson's disease, TIA and BPH who presented to the emergency room with acute onset of syncope.   Clinical Impression   Pt was seen for OT evaluation this date. Prior to hospital admission, pt was ambulating with RW but son later reports that the pt leaves his RW often. Pt lives with his adult sons and generally able to complete ADL without direct assist. Sons assist with IADL. Pt received with PT seated in recliner. OT provided chair follow prior to initiation of OT evaluation. Pt presents to acute OT demonstrating impaired ADL performance and functional mobility 2/2 decreased strength, balance, activity tolerance, and endorses mild dizziness at times with mobility (See OT problem list). Pt currently requires CGA for completing LB ADL in standing, CGA-MIN A for ADL transfers with VC for RW mgt/hand placement. Pt/son educated in falls prevention and home/routines modifications for bathing to maximize safety/indep. Educated in AE/DME to improve safety with ADL. Pt would benefit from skilled OT services to address noted impairments and functional limitations (see below for any additional details) in order to maximize safety and independence while minimizing falls risk and caregiver burden. Upon hospital discharge, recommend HHOT to maximize pt safety and return to functional independence during meaningful occupations of daily life.      Recommendations for follow up therapy are one component of a multi-disciplinary discharge planning process, led by the attending physician.  Recommendations may be updated based on patient status, additional functional criteria and insurance authorization.   Follow Up Recommendations  Home health OT    Assistance Recommended at Discharge Frequent or constant  Supervision/Assistance  Patient can return home with the following A little help with walking and/or transfers;A little help with bathing/dressing/bathroom;Assistance with cooking/housework;Assist for transportation;Direct supervision/assist for medications management    Functional Status Assessment  Patient has had a recent decline in their functional status and demonstrates the ability to make significant improvements in function in a reasonable and predictable amount of time.  Equipment Recommendations  Tub/shower seat    Recommendations for Other Services       Precautions / Restrictions Precautions Precautions: Fall Restrictions Weight Bearing Restrictions: No      Mobility Bed Mobility               General bed mobility comments: NT, pt in recliner upon OT's arrival    Transfers Overall transfer level: Needs assistance Equipment used: Rolling walker (2 wheels) Transfers: Sit to/from Stand Sit to Stand: Min guard, Min assist           General transfer comment: VC for hand placement      Balance Overall balance assessment: Needs assistance Sitting-balance support: Feet supported, Single extremity supported, No upper extremity supported Sitting balance-Leahy Scale: Fair     Standing balance support: No upper extremity supported, During functional activity Standing balance-Leahy Scale: Fair Standing balance comment: able to stand to complete donning of underwear and shorts over hips in standing without overt LOB                           ADL either performed or assessed with clinical judgement   ADL  General ADL Comments: Pt required set up and supv for initiating LB dressing, CGA for completing donning of underwear and pants over hips. CGA for ADL transfers with RW and VC for hand placement.     Vision         Perception     Praxis      Pertinent Vitals/Pain Pain  Assessment Pain Assessment: No/denies pain     Hand Dominance     Extremity/Trunk Assessment Upper Extremity Assessment Upper Extremity Assessment: Generalized weakness;Overall [facility] for tasks assessed   Lower Extremity Assessment Lower Extremity Assessment: Generalized weakness;Overall WFL for tasks assessed       Communication Communication Communication: HOH   Cognition Arousal/Alertness: Awake/alert Behavior During Therapy: WFL for tasks assessed/performed Overall Cognitive Status: No family/caregiver present to determine baseline cognitive functioning                                 General Comments: oriented to self, also HOH, but able to follow commands with cues; family in at end of session     General Comments       Exercises Other Exercises Other Exercises: Pt/son educated in falls prevention and home/routines modifications for bathing to maximize safety/indep. Educated in AE/DME to improve safety with ADL   Shoulder Instructions      Home Living Family/patient expects to be discharged to:: Private residence Living Arrangements: Children (adult sons) Available Help at Discharge: Family;Available 24 hours/day Type of Home: House Home Access: Ramped entrance     Home Layout: One level     Bathroom Shower/Tub: Tub/shower unit         Home Equipment: Agricultural consultant (2 wheels)          Prior Functioning/Environment Prior Level of Function : Needs assist             Mobility Comments: Pt/son report pt sometimes uses 2WW but leaves it throughout the house. When family see him struggling a bit, they ensure he has his walker ADLs Comments: Pt generally able to complete bathing, dressing, and toileting without assist. Sons assist with med mgt, transportation, meals and cleaning.        OT Problem List: Decreased strength;Decreased activity tolerance;Impaired balance (sitting and/or standing);Decreased knowledge of use of DME or AE       OT Treatment/Interventions: Self-care/ADL training;Therapeutic exercise;Therapeutic activities;DME and/or AE instruction;Energy conservation;Patient/family education;Balance training    OT Goals(Current goals can be found in the care plan section) Acute Rehab OT Goals Patient Stated Goal: go home OT Goal Formulation: With patient/family Time For Goal Achievement: 04/18/22 Potential to Achieve Goals: Good  OT Frequency: Min 2X/week    Co-evaluation              AM-PAC OT "6 Clicks" Daily Activity     Outcome Measure Help from another person eating meals?: None Help from another person taking care of personal grooming?: A Little Help from another person toileting, which includes using toliet, bedpan, or urinal?: A Little Help from another person bathing (including washing, rinsing, drying)?: A Little Help from another person to put on and taking off regular upper body clothing?: A Little Help from another person to put on and taking off regular lower body clothing?: A Little 6 Click Score: 19   End of Session Nurse Communication: Mobility status  Activity Tolerance: Patient tolerated treatment well Patient left: in chair;with call bell/phone within reach;with chair alarm set;with family/visitor present  OT Visit Diagnosis: Other abnormalities of gait and mobility (R26.89);Muscle weakness (generalized) (M62.81)                Time: 7902-4097 OT Time Calculation (min): 14 min Charges:  OT General Charges $OT Visit: 1 Visit OT Evaluation $OT Eval Low Complexity: 1 Low OT Treatments $Self Care/Home Management : 8-22 mins  Arman Filter., MPH, MS, OTR/L ascom [phone] 04/04/22, 3:19 PM

## 2022-04-04 NOTE — Evaluation (Signed)
Physical Therapy Evaluation Patient Details Name: Justin Hendrix MRN: [id number] DOB: 1923-01-24 Today's Date: 04/04/2022  History of Present Illness  88 y.o. male with medical history significant for Parkinson's disease, TIA and BPH who presented to the emergency room with acute onset of syncope.  Clinical Impression  Patient resting in bed upon arrival to room; alert and oriented to self only.  Follows commands, pleasant and cooperative; limited ability to retain and integrate new information.  Denies pain.  Bilat Ue/LE strength and ROM grossly symmetrical and WFL for basic transfers and gait; no focal weakness appreciated.  Does endorse mild dizziness with transition to upright; however, appears to accommodate to position (see vitals flowsheet for details) and does not present as barrier to mobility.  Able to complete bed mobility with mod indep; sit/stand, basic transfers and gait (100' x2) with RW, cga/min assist.  Demonstrates forward flexed posture, short shuffling steps with inconsistent foot placement; poor balance with obstacle/turn negotiation, requiring min assist +1 for safety throughout.  do recommend continued use of RW for all mobility tasks at this time. Of note, sats >98% on RA throughout session.  Left on RA end of session. Would benefit from skilled PT to address above deficits and promote optimal return to PLOF.; Recommend transition to HHPT upon discharge from acute hospitalization.          Recommendations for follow up therapy are one component of a multi-disciplinary discharge planning process, led by the attending physician.  Recommendations may be updated based on patient status, additional functional criteria and insurance authorization.  Follow Up Recommendations Home health PT      Assistance Recommended at Discharge Frequent or constant Supervision/Assistance  Patient can return home with the following  A little help with walking and/or transfers;A little help  with bathing/dressing/bathroom    Equipment Recommendations  (has RW)  Recommendations for Other Services       Functional Status Assessment Patient has had a recent decline in their functional status and demonstrates the ability to make significant improvements in function in a reasonable and predictable amount of time.     Precautions / Restrictions Precautions Precautions: Fall Restrictions Weight Bearing Restrictions: No      Mobility  Bed Mobility Overal bed mobility: Modified Independent                  Transfers Overall transfer level: Needs assistance Equipment used: Rolling walker (2 wheels) Transfers: Sit to/from Stand Sit to Stand: Min assist           General transfer comment: cuing for hand placement; min assist for initial lift off, improving with repetition    Ambulation/Gait Ambulation/Gait assistance: Min assist Gait Distance (Feet):  (100' x2) Assistive device: Rolling walker (2 wheels)         General Gait Details: forward flexed posture, short shuffling steps with inconsistent foot placement; poor balance with obstacle/turn negotiation, requiring min assist +1 for safety throughout.  do recommend continued use of RW for all mobility tasks at this time.  Stairs            Wheelchair Mobility    Modified Rankin (Stroke Patients Only)       Balance Overall balance assessment: Needs assistance Sitting-balance support: No upper extremity supported, Feet supported Sitting balance-Leahy Scale: Good     Standing balance support: Bilateral upper extremity supported Standing balance-Leahy Scale: Fair Standing balance comment: cga/min assist +1 for safety with all standing tasks  Pertinent Vitals/Pain Pain Assessment Pain Assessment: No/denies pain    Home Living Family/patient expects to be discharged to:: Private residence Living Arrangements: Children Available Help at Discharge:  Family;Available 24 hours/day Type of Home: House Home Access: Ramped entrance       Home Layout: One level Home Equipment: Agricultural consultant (2 wheels)      Prior Function Prior Level of Function : Needs assist             Mobility Comments: Pt/son report pt sometimes uses 2WW but leaves it throughout the house. When family see him struggling a bit, they ensure he has his walker ADLs Comments: Pt generally able to complete bathing, dressing, and toileting without assist. Sons assist with med mgt, transportation, meals and cleaning.     Hand Dominance   Dominant Hand: Right    Extremity/Trunk Assessment   Upper Extremity Assessment Upper Extremity Assessment: Overall WFL for tasks assessed    Lower Extremity Assessment Lower Extremity Assessment: Overall WFL for tasks assessed (grossly at least 4-/5 throughout; no focal weakness appreciated)       Communication   Communication: HOH  Cognition Arousal/Alertness: Awake/alert Behavior During Therapy: WFL for tasks assessed/performed                                   General Comments: oriented to self, also HOH, but able to follow commands with cues; family in at end of session        General Comments      Exercises Other Exercises Other Exercises: Educated on recommendations for RW at all times, briefly reviewed management of orthostasis (slow movement transitions with pauses at each transition), signs/symptoms and need for seated rest break when noted.  Son voiced understanding and agreement.   Assessment/Plan    PT Assessment Patient needs continued PT services  PT Problem List Decreased strength;Decreased activity tolerance;Decreased balance;Decreased mobility;Decreased coordination;Decreased cognition;Decreased knowledge of use of DME;Decreased safety awareness;Decreased knowledge of precautions;Cardiopulmonary status limiting activity       PT Treatment Interventions DME instruction;Gait  training;Functional mobility training;Therapeutic activities;Therapeutic exercise;Balance training;Patient/family education    PT Goals (Current goals can be found in the Care Plan section)  Acute Rehab PT Goals Patient Stated Goal: to go home with son PT Goal Formulation: With patient/family Time For Goal Achievement: 04/18/22 Potential to Achieve Goals: Good    Frequency Min 2X/week     Co-evaluation               AM-PAC PT "6 Clicks" Mobility  Outcome Measure Help needed turning from your back to your side while in a flat bed without using bedrails?: None Help needed moving from lying on your back to sitting on the side of a flat bed without using bedrails?: None Help needed moving to and from a bed to a chair (including a wheelchair)?: A Little Help needed standing up from a chair using your arms (e.g., wheelchair or bedside chair)?: A Little Help needed to walk in hospital room?: A Little Help needed climbing 3-5 steps with a railing? : A Little 6 Click Score: 20    End of Session Equipment Utilized During Treatment: Gait belt Activity Tolerance: Patient tolerated treatment well Patient left: in chair;with call bell/phone within reach (OT present at bedside end of session) Nurse Communication: Mobility status PT Visit Diagnosis: Muscle weakness (generalized) (M62.81);Difficulty in walking, not elsewhere classified (R26.2)    Time: 8453-6468 PT Time  Calculation (min) (ACUTE ONLY): 37 min   Charges:   PT Evaluation $PT Eval Moderate Complexity: 1 Mod PT Treatments $Therapeutic Activity: 8-22 mins        Quamir Willemsen H. Manson Passey, PT, DPT, NCS 04/04/22, 4:23 PM [phone]

## 2022-04-04 NOTE — Discharge Summary (Signed)
Physician Discharge Summary  Justin Hendrix HGD:[id number] DOB: [birth year] DOA: 04/03/2022  PCP: Jaclyn Shaggy, MD  Admit date: 04/03/2022 Discharge date: 04/04/2022  Admitted From: Home Disposition:  Home with home health  Recommendations for Outpatient Follow-up:  Follow up with PCP in 1-2 weeks   Home Health:Yes Equipment/Devices:Shower seat   Discharge Condition:Stable  CODE STATUS:DNR  Diet recommendation: Reg  Brief/Interim Summary: 88 y.o. male with medical history significant for Parkinson's disease, TIA and BPH who presented to the emergency room with acute onset of syncope.  The patient was having diarrhea and was sitting on the toilet when this happened.  Is been feeling weak and dehydrated over the last few weeks.  No fever or chills.  No nausea or vomiting.  No dysuria, oliguria or hematuria or flank pain.  No chest pain or palpitations but no cough or wheezing or hemoptysis.  Patient back to baseline at time of discharge.  Seen by PT and OT.  Home health services recommended and ordered.  I suspect the patient's symptoms secondary to orthostatic hypotension in the setting of diarrhea as well as vasovagal event as syncopal event occurred while using the toilet.  No changes made to home medication regimen at time of discharge.     Discharge Diagnoses:  Principal Problem:   Syncope Active Problems:   Benign prostatic hyperplasia with lower urinary tract symptoms   Paroxysmal atrial fibrillation (HCC)   GERD without esophagitis   Depression  Syncope Suspect orthostatic event in the setting of diarrhea and vasovagal event while using the toilet.  Defer echocardiogram at this time.  No changes at home medication regimen.  Seen by PT and OT in house.  Recommend home health on discharge.  Home health services ordered.  Patient stable for discharge home.  Discharge Instructions  Discharge Instructions     Diet - low sodium heart healthy   Complete by: As directed     Increase activity slowly   Complete by: As directed    No wound care   Complete by: As directed       Allergies as of 04/04/2022   No Known Allergies      Medication List     STOP taking these medications    ondansetron 4 MG tablet Commonly known as: ZOFRAN   polyethylene glycol 17 g packet Commonly known as: MIRALAX / GLYCOLAX   senna-docusate 8.6-50 MG tablet Commonly known as: Senokot-S   sertraline 100 MG tablet Commonly known as: ZOLOFT       TAKE these medications    Amantadine HCl 100 MG tablet Take 100 mg by mouth daily.   apixaban 2.5 MG Tabs tablet Commonly known as: ELIQUIS Take 1 tablet (2.5 mg total) by mouth 2 (two) times daily.   atenolol 50 MG tablet Commonly known as: TENORMIN Take 1 tablet (50 mg) by mouth once daily   budesonide-formoterol 160-4.5 MCG/ACT inhaler Commonly known as: SYMBICORT Inhale 2 puffs into the lungs 2 (two) times daily as needed (respiratory difficulty).   docusate sodium 100 MG capsule Commonly known as: COLACE Take 1 capsule (100 mg total) by mouth 2 (two) times daily. What changed:  when to take this reasons to take this   finasteride 5 MG tablet Commonly known as: PROSCAR Take 1 tablet (5 mg total) by mouth daily.   fluticasone 50 MCG/ACT nasal spray Commonly known as: FLONASE 2 sprays by Each Nare route daily. as needed   pantoprazole 40 MG tablet Commonly known as: PROTONIX Take  40 mg by mouth daily.   tamsulosin 0.4 MG Caps capsule Commonly known as: FLOMAX TAKE 1 CAPSULE BY MOUTH ONCE DAILY 30 MINUTES AFTER LARGEST MEAL.               Durable Medical Equipment  (From admission, onward)           Start     Ordered   04/04/22 1336  For home use only DME Shower stool  Once        04/04/22 1335            No Known Allergies  Consultations: None  Procedures/Studies: DG Chest Port 1 View  Result Date: 04/03/2022 CLINICAL DATA:  Syncope EXAM: PORTABLE CHEST 1 VIEW  COMPARISON:  10/15/2021 FINDINGS: Left-sided implanted cardiac device. Stable mild cardiomegaly. Probable hiatal hernia. Aortic atherosclerosis. No focal airspace consolidation, pleural effusion, or pneumothorax. IMPRESSION: No active disease. Electronically Signed   By: Duanne Guess D.O.   On: 04/03/2022 17:12   CT Head Wo Contrast  Result Date: 04/03/2022 CLINICAL DATA:  88 year old male with altered mental status and syncope. EXAM: CT HEAD WITHOUT CONTRAST TECHNIQUE: Contiguous axial images were obtained from the base of the skull through the vertex without intravenous contrast. RADIATION DOSE REDUCTION: This exam was performed according to the departmental dose-optimization program which includes automated exposure control, adjustment of the mA and/or kV according to patient size and/or use of iterative reconstruction technique. COMPARISON:  10/15/2021 and prior CTs FINDINGS: Brain: No evidence of acute infarction, hemorrhage, hydrocephalus, extra-axial collection or mass lesion/mass effect. Atrophy, chronic small-vessel white matter ischemic changes and remote basal ganglia and thalamic lacunar infarcts again noted. Vascular: Carotid and vertebral atherosclerotic calcifications are noted. Skull: Normal. Negative for fracture or focal lesion. Sinuses/Orbits: No acute finding. Other: None. IMPRESSION: 1. No evidence of acute intracranial abnormality. 2. Atrophy, chronic small-vessel white matter ischemic changes and remote lacunar infarcts. Electronically Signed   By: Harmon Pier M.D.   On: 04/03/2022 16:56      Subjective: Seen and examined the day of discharge.  Stable no distress.  Back to baseline level of functioning.  Stable for discharge home.  Discharge Exam: Vitals:   04/04/22 0900 04/04/22 1052  BP: (!) 197/113 (!) 148/90  Pulse:  66  Resp:    Temp:    SpO2:     Vitals:   04/04/22 0420 04/04/22 0739 04/04/22 0900 04/04/22 1052  BP: (!) 160/80 (!) 162/99 (!) 197/113 (!) 148/90   Pulse: (!) 59 61  66  Resp: 16 14    Temp: (!) 96.1 F (35.6 C)     TempSrc: Axillary     SpO2: 100% 100%    Weight:      Height:        General: Pt is alert, awake, not in acute distress Cardiovascular: RRR, S1/S2 +, no rubs, no gallops Respiratory: CTA bilaterally, no wheezing, no rhonchi Abdominal: Soft, NT, ND, bowel sounds + Extremities: no edema, no cyanosis    The results of significant diagnostics from this hospitalization (including imaging, microbiology, ancillary and laboratory) are listed below for reference.     Microbiology: No results found for this or any previous visit (from the past 240 hour(s)).   Labs: BNP (last 3 results) No results for input(s): "BNP" in the last 8760 hours. Basic Metabolic Panel: Recent Labs  Lab 04/03/22 1531 04/04/22 0533  NA 137 139  K 3.6 3.8  CL 105 110  CO2 24 24  GLUCOSE 110* 110*  BUN 25* 21  CREATININE 1.84* 1.32*  CALCIUM 7.8* 7.6*   Liver Function Tests: Recent Labs  Lab 04/03/22 1531  AST 39  ALT 11  ALKPHOS 140*  BILITOT 0.6  PROT 5.6*  ALBUMIN 2.8*   No results for input(s): "LIPASE", "AMYLASE" in the last 168 hours. No results for input(s): "AMMONIA" in the last 168 hours. CBC: Recent Labs  Lab 04/03/22 1531 04/04/22 0533  WBC 5.0 4.9  NEUTROABS 2.5  --   HGB 12.5* 12.5*  HCT 39.8 38.8*  MCV 90.7 88.8  PLT 167 153   Cardiac Enzymes: Recent Labs  Lab 04/03/22 1535  CKTOTAL 98   BNP: Invalid input(s): "POCBNP" CBG: Recent Labs  Lab 04/04/22 0423 04/04/22 0453 04/04/22 0523  GLUCAP 54* 61* 84   D-Dimer No results for input(s): "DDIMER" in the last 72 hours. Hgb A1c No results for input(s): "HGBA1C" in the last 72 hours. Lipid Profile No results for input(s): "CHOL", "HDL", "LDLCALC", "TRIG", "CHOLHDL", "LDLDIRECT" in the last 72 hours. Thyroid function studies No results for input(s): "TSH", "T4TOTAL", "T3FREE", "THYROIDAB" in the last 72 hours.  Invalid input(s):  "FREET3" Anemia work up No results for input(s): "VITAMINB12", "FOLATE", "FERRITIN", "TIBC", "IRON", "RETICCTPCT" in the last 72 hours. Urinalysis    Component Value Date/Time   COLORURINE YELLOW (A) 10/15/2021 1715   APPEARANCEUR HAZY (A) 10/15/2021 1715   APPEARANCEUR Clear 02/08/2018 1103   LABSPEC 1.019 10/15/2021 1715   PHURINE 5.0 10/15/2021 1715   GLUCOSEU NEGATIVE 10/15/2021 1715   HGBUR NEGATIVE 10/15/2021 1715   BILIRUBINUR NEGATIVE 10/15/2021 1715   BILIRUBINUR Negative 02/08/2018 1103   KETONESUR NEGATIVE 10/15/2021 1715   PROTEINUR NEGATIVE 10/15/2021 1715   NITRITE NEGATIVE 10/15/2021 1715   LEUKOCYTESUR NEGATIVE 10/15/2021 1715   Sepsis Labs Recent Labs  Lab 04/03/22 1531 04/04/22 0533  WBC 5.0 4.9   Microbiology No results found for this or any previous visit (from the past 240 hour(s)).   Time coordinating discharge: Over 30 minutes  SIGNED:   Tresa Moore, MD  [facility] 04/04/2022, 1:48 PM Pager   If 7PM-7AM, please contact night-coverage

## 2022-04-04 NOTE — TOC Initial Note (Signed)
Transition of Care [facility]) - Initial/Assessment Note    Patient Details  Name: Justin Hendrix MRN: [id number] Date of Birth: Jun 22, 1923  Transition of Care [facility]) CM/SW Contact:    Chapman Fitch, RN Phone Number: 04/04/2022, 2:19 PM  Clinical Narrative:                  Patient to discharge today Son at bedside Admitted for: syncope Admitted from:home with son PCP: Arlana Pouch, son transports to appointments  Current home health/prior home health/DME: RW  Therapy recommending home health. Patient in agreement and states he does not have a preference of home health agency.  Referral made and accepted by [facility] with Frances Furbish.  Patient to private pay for shower stool.  Referral made to [facility] with [employer].  To be delivered to room prior to discharge          Patient Goals and CMS Choice        Expected Discharge Plan and Services           Expected Discharge Date: 04/04/22                                    Prior Living Arrangements/Services                       Activities of Daily Living Home Assistive Devices/Equipment: Dan Humphreys (specify type) ADL Screening (condition at time of admission) Patient's cognitive ability adequate to safely complete daily activities?: No Is the patient deaf or have difficulty hearing?: No Does the patient have difficulty seeing, even when wearing glasses/contacts?: No Does the patient have difficulty concentrating, remembering, or making decisions?: Yes Patient able to express need for assistance with ADLs?: No Does the patient have difficulty dressing or bathing?: No Independently performs ADLs?: No Communication: Independent Does the patient have difficulty walking or climbing stairs?: Yes Weakness of Legs: Left Weakness of Arms/Hands: None  Permission Sought/Granted                  Emotional Assessment              Admission diagnosis:  Syncope [R55] Syncope, unspecified syncope type [R55] Patient Active  Problem List   Diagnosis Date Noted   Syncope 04/03/2022   Paroxysmal atrial fibrillation (HCC) 04/03/2022   GERD without esophagitis 04/03/2022   Depression 04/03/2022   Complete heart block (HCC) 11/03/2017   History of TIA (transient ischemic attack) 11/03/2017   S/P right hip fracture 11/03/2017   Chest pain at rest 05/14/2014   SOB (shortness of breath) 05/14/2014   Chronic fatigue 05/14/2014   Leg weakness, bilateral 05/14/2014   Urinary urgency 05/14/2014   Benign prostatic hyperplasia with lower urinary tract symptoms 05/14/2014   Parkinson's disease (HCC) 05/14/2014   Chronic prostatitis 02/14/2013   Elevated prostate specific antigen (PSA) 02/14/2013   Incomplete emptying of bladder 02/14/2013   Increased frequency of urination 02/14/2013   PCP:  Jaclyn Shaggy, MD Pharmacy:   [facility] [place], [place] - [address] [place] [address] Phone: [phone] Fax: [phone]     Social Determinants of Health (SDOH) Interventions    Readmission Risk Interventions     No data to display

## 2022-04-04 NOTE — Progress Notes (Signed)
PT Cancellation Note  Patient Details Name: Justin Hendrix MRN: [id number] DOB: 11-05-1922   Cancelled Treatment:    Reason Eval/Treat Not Completed: Medical issues which prohibited therapy (Consult received and chart reviewed.  Evaluation attempted; noted with elevated supine BP (197/113), pending morning meds at this time. Will hold PT evaluation until meds received and BP better controlled for mobility assessment.)  Breakfast tray noted in room; patient set up with meal to eat breakfast prior to therapist return.  Epimenio Schetter H. Manson Passey, PT, DPT, NCS 04/04/22, 10:05 AM [phone]

## 2022-05-02 ENCOUNTER — Emergency Department: Payer: Medicare PPO

## 2022-05-02 ENCOUNTER — Observation Stay
Admission: EM | Admit: 2022-05-02 | Discharge: 2022-05-03 | Disposition: A | Discharge status: Home Health | Payer: Medicare PPO | Attending: Internal Medicine | Admitting: Internal Medicine

## 2022-05-02 ENCOUNTER — Observation Stay: Payer: Medicare PPO

## 2022-05-02 ENCOUNTER — Other Ambulatory Visit: Payer: Self-pay

## 2022-05-02 DIAGNOSIS — G20A1 Parkinson's disease without dyskinesia, without mention of fluctuations: Secondary | ICD-10-CM | POA: Diagnosis not present

## 2022-05-02 DIAGNOSIS — E876 Hypokalemia: Secondary | ICD-10-CM | POA: Diagnosis not present

## 2022-05-02 DIAGNOSIS — E871 Hypo-osmolality and hyponatremia: Secondary | ICD-10-CM | POA: Insufficient documentation

## 2022-05-02 DIAGNOSIS — Z7901 Long term (current) use of anticoagulants: Secondary | ICD-10-CM | POA: Insufficient documentation

## 2022-05-02 DIAGNOSIS — Z8673 Personal history of transient ischemic attack (TIA), and cerebral infarction without residual deficits: Secondary | ICD-10-CM

## 2022-05-02 DIAGNOSIS — G20C Parkinsonism, unspecified: Secondary | ICD-10-CM | POA: Diagnosis not present

## 2022-05-02 DIAGNOSIS — Z87891 Personal history of nicotine dependence: Secondary | ICD-10-CM | POA: Insufficient documentation

## 2022-05-02 DIAGNOSIS — I480 Paroxysmal atrial fibrillation: Secondary | ICD-10-CM | POA: Insufficient documentation

## 2022-05-02 DIAGNOSIS — K219 Gastro-esophageal reflux disease without esophagitis: Secondary | ICD-10-CM | POA: Diagnosis not present

## 2022-05-02 DIAGNOSIS — F32A Depression, unspecified: Secondary | ICD-10-CM | POA: Diagnosis present

## 2022-05-02 DIAGNOSIS — N1831 Chronic kidney disease, stage 3a: Secondary | ICD-10-CM | POA: Diagnosis not present

## 2022-05-02 DIAGNOSIS — G459 Transient cerebral ischemic attack, unspecified: Principal | ICD-10-CM | POA: Insufficient documentation

## 2022-05-02 DIAGNOSIS — Z950 Presence of cardiac pacemaker: Secondary | ICD-10-CM | POA: Diagnosis not present

## 2022-05-02 DIAGNOSIS — R531 Weakness: Secondary | ICD-10-CM | POA: Diagnosis present

## 2022-05-02 LAB — PROTIME-INR
INR: 1.7 — ABNORMAL HIGH (ref 0.8–1.2)
Prothrombin Time: 19.8 seconds — ABNORMAL HIGH (ref 11.4–15.2)

## 2022-05-02 LAB — COMPREHENSIVE METABOLIC PANEL
ALT: 8 U/L (ref 0–44)
AST: 29 U/L (ref 15–41)
Albumin: 2.5 g/dL — ABNORMAL LOW (ref 3.5–5.0)
Alkaline Phosphatase: 278 U/L — ABNORMAL HIGH (ref 38–126)
Anion gap: 11 (ref 5–15)
BUN: 12 mg/dL (ref 8–23)
CO2: 22 mmol/L (ref 22–32)
Calcium: 8.1 mg/dL — ABNORMAL LOW (ref 8.9–10.3)
Chloride: 102 mmol/L (ref 98–111)
Creatinine, Ser: 1.36 mg/dL — ABNORMAL HIGH (ref 0.61–1.24)
GFR, Estimated: 47 mL/min — ABNORMAL LOW (ref >60)
Glucose, Bld: 86 mg/dL (ref 70–99)
Potassium: 3.4 mmol/L — ABNORMAL LOW (ref 3.5–5.1)
Sodium: 135 mmol/L (ref 135–145)
Total Bilirubin: 1.2 mg/dL (ref 0.3–1.2)
Total Protein: 5.8 g/dL — ABNORMAL LOW (ref 6.5–8.1)

## 2022-05-02 LAB — CBC
HCT: 38.9 % — ABNORMAL LOW (ref 39.0–52.0)
Hemoglobin: 12.9 g/dL — ABNORMAL LOW (ref 13.0–17.0)
MCH: 28.9 pg (ref 26.0–34.0)
MCHC: 33.2 g/dL (ref 30.0–36.0)
MCV: 87 fL (ref 80.0–100.0)
Platelets: 194 10*3/uL (ref 150–400)
RBC: 4.47 MIL/uL (ref 4.22–5.81)
RDW: 17.4 % — ABNORMAL HIGH (ref 11.5–15.5)
WBC: 6.7 10*3/uL (ref 4.0–10.5)
nRBC: 0 % (ref 0.0–0.2)

## 2022-05-02 LAB — DIFFERENTIAL
Abs Immature Granulocytes: 0.02 10*3/uL (ref 0.00–0.07)
Basophils Absolute: 0.1 10*3/uL (ref 0.0–0.1)
Basophils Relative: 1 %
Eosinophils Absolute: 0.3 10*3/uL (ref 0.0–0.5)
Eosinophils Relative: 4 %
Immature Granulocytes: 0 %
Lymphocytes Relative: 33 %
Lymphs Abs: 2.2 10*3/uL (ref 0.7–4.0)
Monocytes Absolute: 0.5 10*3/uL (ref 0.1–1.0)
Monocytes Relative: 8 %
Neutro Abs: 3.6 10*3/uL (ref 1.7–7.7)
Neutrophils Relative %: 54 %

## 2022-05-02 LAB — HEMOGLOBIN A1C
Hgb A1c MFr Bld: 5.6 % (ref 4.8–5.6)
Mean Plasma Glucose: 114.02 mg/dL

## 2022-05-02 LAB — APTT: aPTT: 33 seconds (ref 24–36)

## 2022-05-02 LAB — ETHANOL: Alcohol, Ethyl (B): <10 mg/dL (ref <10)

## 2022-05-02 MED ORDER — AMANTADINE HCL 100 MG PO CAPS
100.0000 mg | ORAL_CAPSULE | Freq: Every day | ORAL | Status: DC
  Administered 2022-05-03: 100 mg via ORAL
  Filled 2022-05-02: qty 1

## 2022-05-02 MED ORDER — PANTOPRAZOLE SODIUM 40 MG PO TBEC
40.0000 mg | DELAYED_RELEASE_TABLET | Freq: Every day | ORAL | Status: DC
  Administered 2022-05-03: 40 mg via ORAL
  Filled 2022-05-02: qty 1

## 2022-05-02 MED ORDER — POTASSIUM CHLORIDE CRYS ER 20 MEQ PO TBCR: 40.0000 meq | EXTENDED_RELEASE_TABLET | Freq: Once | ORAL | Status: DC

## 2022-05-02 MED ORDER — ACETAMINOPHEN 160 MG/5ML PO SOLN: 650.0000 mg | ORAL | Status: DC | PRN

## 2022-05-02 MED ORDER — POTASSIUM CHLORIDE 10 MEQ/100ML IV SOLN
10.0000 meq | Freq: Once | INTRAVENOUS | Status: AC
  Administered 2022-05-02: 10 meq via INTRAVENOUS
  Filled 2022-05-02: qty 100

## 2022-05-02 MED ORDER — SENNOSIDES-DOCUSATE SODIUM 8.6-50 MG PO TABS: 1.0000 | ORAL_TABLET | Freq: Every evening | ORAL | Status: DC | PRN

## 2022-05-02 MED ORDER — ACETAMINOPHEN 650 MG RE SUPP: 650.0000 mg | RECTAL | Status: DC | PRN

## 2022-05-02 MED ORDER — TAMSULOSIN HCL 0.4 MG PO CAPS: 0.4000 mg | ORAL_CAPSULE | Freq: Every day | ORAL | Status: DC

## 2022-05-02 MED ORDER — SODIUM CHLORIDE 0.9% FLUSH: 3.0000 mL | Freq: Once | INTRAVENOUS | Status: DC

## 2022-05-02 MED ORDER — STROKE: EARLY STAGES OF RECOVERY BOOK: Freq: Once | Status: AC

## 2022-05-02 MED ORDER — MOMETASONE FURO-FORMOTEROL FUM 200-5 MCG/ACT IN AERO
2.0000 | INHALATION_SPRAY | Freq: Two times a day (BID) | RESPIRATORY_TRACT | Status: DC
  Administered 2022-05-03: 2 via RESPIRATORY_TRACT
  Filled 2022-05-02: qty 8.8

## 2022-05-02 MED ORDER — FINASTERIDE 5 MG PO TABS
5.0000 mg | ORAL_TABLET | Freq: Every day | ORAL | Status: DC
  Administered 2022-05-03: 5 mg via ORAL
  Filled 2022-05-02: qty 1

## 2022-05-02 MED ORDER — ACETAMINOPHEN 325 MG PO TABS: 650.0000 mg | ORAL_TABLET | ORAL | Status: DC | PRN

## 2022-05-02 MED ORDER — APIXABAN 2.5 MG PO TABS
2.5000 mg | ORAL_TABLET | Freq: Two times a day (BID) | ORAL | Status: DC
  Administered 2022-05-02 – 2022-05-03 (×2): 2.5 mg via ORAL
  Filled 2022-05-02 (×3): qty 1

## 2022-05-02 NOTE — ED Provider Notes (Signed)
[facility] Provider Note    Event Date/Time   First MD Initiated Contact with Patient 05/02/22 1644     (approximate)   History   Weakness   HPI  Justin Hendrix is a 88 y.o. male who is awake and alert gets around by himself.  He was reaching for his walker when he suddenly slumped over had a droopy right side of the face could not move his right arm right leg appeared to be weak to EMS was called the patient recovered.  He is back to his baseline with good strength in both arms no facial drooping no leg weakness.      Physical Exam   Triage Vital Signs: ED Triage Vitals  Enc Vitals Group     BP 05/02/22 1238 101/73     Pulse Rate 05/02/22 1238 60     Resp 05/02/22 1238 16     Temp 05/02/22 1238 98 F (36.7 C)     Temp Source 05/02/22 1238 Oral     SpO2 05/02/22 1238 98 %     Weight --      Height --      Head Circumference --      Peak Flow --      Pain Score 05/02/22 1218 0     Pain Loc --      Pain Edu? --      Excl. in GC? --     Most recent vital signs: Vitals:   05/02/22 1630 05/02/22 1900  BP: 131/82 125/80  Pulse: 63 66  Resp: 12 18  Temp: 98 F (36.7 C)   SpO2: 100% 100%     General: Awake, no distress.  CV:  Good peripheral perfusion.  Heart regular rate and rhythm no audible murmurs Resp:  Normal effort.  Lungs are clear Abd:  No distention.  Soft and nontender Extremities: No edema Neuro exam cranial nerves II through XII are intact although visual fields were not checked cerebellar finger-nose rapid alternating movements in the hands are normal motor strength is 5/5 throughout patient does not report any numbness.  ED Results / Procedures / Treatments   Labs (all labs ordered are listed, but only abnormal results are displayed) Labs Reviewed  PROTIME-INR - Abnormal; Notable for the following components:      Result Value   Prothrombin Time 19.8 (*)    INR 1.7 (*)    All other components within normal  limits  CBC - Abnormal; Notable for the following components:   Hemoglobin 12.9 (*)    HCT 38.9 (*)    RDW 17.4 (*)    All other components within normal limits  COMPREHENSIVE METABOLIC PANEL - Abnormal; Notable for the following components:   Potassium 3.4 (*)    Creatinine, Ser 1.36 (*)    Calcium 8.1 (*)    Total Protein 5.8 (*)    Albumin 2.5 (*)    Alkaline Phosphatase 278 (*)    GFR, Estimated 47 (*)    All other components within normal limits  APTT  DIFFERENTIAL  ETHANOL  LIPID PANEL  BASIC METABOLIC PANEL  MAGNESIUM  HEMOGLOBIN A1C     EKG  EKG read and interpreted by me shows a ventricularly paced rhythm rate of 73 somewhat irregular   RADIOLOGY CT of the head read by radiology reviewed and interpreted by me shows some chronic infarct in the brain no acute changes.  There is partial opacification of left frontal sinus because  of mucus thickening in fluid.  Other sinuses are involved as well.  PROCEDURES:  Critical Care performed: Degele care time 20 minutes.  This includes evaluating the patient reviewing his old records especially at least 8 minutes talking to him and his son about the reasons to keep him in the hospital overnight mostly so he did not have a very large stroke with complete right-sided paralysis that did not resolve.  As I explained to them mostly to the patient himself the best way we can do this is to keep him in the hospital at least overnight and evaluate him more thoroughly.  Procedures   MEDICATIONS ORDERED IN ED: Medications  sodium chloride flush (NS) 0.9 % injection 3 mL (has no administration in time range)   stroke: early stages of recovery book (has no administration in time range)  acetaminophen (TYLENOL) tablet 650 mg (has no administration in time range)    Or  acetaminophen (TYLENOL) 160 MG/5ML solution 650 mg (has no administration in time range)    Or  acetaminophen (TYLENOL) suppository 650 mg (has no administration in time  range)  senna-docusate (Senokot-S) tablet 1 tablet (has no administration in time range)  apixaban (ELIQUIS) tablet 2.5 mg (has no administration in time range)  potassium chloride 10 mEq in 100 mL IVPB (has no administration in time range)  pantoprazole (PROTONIX) EC tablet 40 mg (has no administration in time range)  finasteride (PROSCAR) tablet 5 mg (has no administration in time range)  tamsulosin (FLOMAX) capsule 0.4 mg (has no administration in time range)  amantadine (SYMMETREL) capsule 100 mg (has no administration in time range)  mometasone-formoterol (DULERA) 200-5 MCG/ACT inhaler 2 puff (has no administration in time range)     IMPRESSION / MDM / ASSESSMENT AND PLAN / ED COURSE  I reviewed the triage vital signs and the nursing notes.  Differential diagnosis includes, but is not limited to, TIA.  Complex migraine is unlikely as the patient did not have a headache.  Stroke is unlikely the patient's symptoms resolved completely.  MS is unlikely as the patient is too old and again the symptoms were on and off very quickly.  Patient's presentation is most consistent with acute presentation with potential threat to life or bodily function.  The patient is on the cardiac monitor to evaluate for evidence of arrhythmia and/or significant heart rate changes.  None have been seen    FINAL CLINICAL IMPRESSION(S) / ED DIAGNOSES   Final diagnoses:  TIA (transient ischemic attack)     Rx / DC Orders   ED Discharge Orders     None        Note:  This document was prepared using Dragon voice recognition software and may include unintentional dictation errors.   Nena Polio, MD 05/02/22 [phone]

## 2022-05-02 NOTE — H&P (Signed)
History and Physical    Patient: Justin Hendrix:[id number] DOB: [birth year] DOA: 05/02/2022 DOS: the patient was seen and examined on 05/02/2022 PCP: Jaclyn Shaggy, MD  Patient coming from: Home  Chief Complaint:  Chief Complaint  Patient presents with   Weakness   HPI: Justin Hendrix is a 88 y.o. male with medical history significant of paroxysmal atrial fibrillation, Parkinson disease, history of TIA, depression, chronic kidney disease stage IIIa, who presents to the hospital complaining of right-sided weakness and facial droop.  Symptoms lasted a few hours and resolved.  Upon arriving the hospital, CT scan showed chronic small vessel disease with old stroke, no acute changes.  Potassium 3.4.   Review of Systems: As mentioned in the history of present illness. All other systems reviewed and are negative. Past Medical History:  Diagnosis Date   Benign prostatic hypertrophy    Chest pain    a. 04/2014 Echo: EF 50-55%, mild LVH, nl RV fxn, mild to mod AS, mild AI, mild TR; b. 04/2014 MV: nl EF. No ischemia.   Chronic prostatitis    Elevated PSA    Incomplete bladder emptying    Parkinson's disease    Pre-syncope    Presence of permanent cardiac pacemaker    TIA (transient ischemic attack)    Unsteady gait    Urinary frequency    Past Surgical History:  Procedure Laterality Date   INTRAMEDULLARY (IM) NAIL INTERTROCHANTERIC Right 11/04/2017   Procedure: INTRAMEDULLARY (IM) NAIL INTERTROCHANTRIC;  Surgeon: Bjorn Pippin, MD;  Location: [facility] OR;  Service: Orthopedics;  Laterality: Right;   PACEMAKER IMPLANT N/A 11/03/2017   Procedure: PACEMAKER IMPLANT;  Surgeon: Hillis Range, MD;  Location: [facility] INVASIVE CV LAB;  Service: Cardiovascular;  Laterality: N/A;   Social History:  reports that he has quit smoking. His smokeless tobacco use includes chew. He reports that he does not drink alcohol and does not use drugs.  No Known Allergies  Family History  Problem Relation Age of  Onset   Hypertension Father     Prior to Admission medications   Medication Sig Start Date End Date Taking? Authorizing Provider  Amantadine HCl 100 MG tablet Take 100 mg by mouth daily. 02/22/22   [provider]  apixaban (ELIQUIS) 2.5 MG TABS tablet Take 1 tablet (2.5 mg total) by mouth 2 (two) times daily. 11/12/21   Duke Salvia, MD  atenolol (TENORMIN) 50 MG tablet Take 1 tablet (50 mg) by mouth once daily    [provider]  budesonide-formoterol (SYMBICORT) 160-4.5 MCG/ACT inhaler Inhale 2 puffs into the lungs 2 (two) times daily as needed (respiratory difficulty).     [provider]  docusate sodium (COLACE) 100 MG capsule Take 1 capsule (100 mg total) by mouth 2 (two) times daily. Patient taking differently: Take 100 mg by mouth as needed. 11/03/17   Altamese Dilling, MD  finasteride (PROSCAR) 5 MG tablet Take 1 tablet (5 mg total) by mouth daily. 03/10/21   Stoioff, Verna Czech, MD  fluticasone (FLONASE) 50 MCG/ACT nasal spray 2 sprays by Each Nare route daily. as needed    [provider]  pantoprazole (PROTONIX) 40 MG tablet Take 40 mg by mouth daily.    [provider]  tamsulosin (FLOMAX) 0.4 MG CAPS capsule TAKE 1 CAPSULE BY MOUTH ONCE DAILY 30 MINUTES AFTER LARGEST MEAL. 03/10/21   [name], MD    Physical Exam: Vitals:   05/02/22 1238 05/02/22 1630  BP: 101/73 131/82  Pulse:  60 63  Resp: 16 12  Temp: 98 F (36.7 C) 98 F (36.7 C)  TempSrc: Oral   SpO2: 98% 100%   Physical Exam Constitutional:      General: He is not in acute distress.    Appearance: Normal appearance. He is not ill-appearing or toxic-appearing.  HENT:     Head: Normocephalic and atraumatic.     Nose: Nose normal. No congestion.     Mouth/Throat:     Mouth: Mucous membranes are moist.     Pharynx: Oropharynx is clear. No oropharyngeal exudate.  Neck:     Vascular: No carotid bruit.  Cardiovascular:     Rate and Rhythm: Normal rate. Rhythm  irregular.     Heart sounds: No murmur heard.    No gallop.  Pulmonary:     Effort: Pulmonary effort is normal. No respiratory distress.     Breath sounds: Normal breath sounds. No wheezing.  Abdominal:     General: Abdomen is flat. Bowel sounds are normal.     Palpations: Abdomen is soft.     Tenderness: There is no abdominal tenderness.  Musculoskeletal:        General: No swelling or tenderness. Normal range of motion.     Cervical back: Normal range of motion and neck supple. No rigidity or tenderness.  Lymphadenopathy:     Cervical: No cervical adenopathy.  Skin:    General: Skin is warm and dry.     Coloration: Skin is not jaundiced.  Neurological:     General: No focal deficit present.     Mental Status: He is alert and oriented to person, place, and time.     Cranial Nerves: No cranial nerve deficit.  Psychiatric:        Mood and Affect: Mood normal.        Behavior: Behavior normal.     Data Reviewed:  Results as above.  Assessment and Plan: TIA. Patient presents today with right-sided weakness and facial droop, symptoms resolved.  Condition consistent with TIA.  Patient is not a thrombolytic candidate due to rapid resolution of symptoms. Patient has a pacemaker, will not obtain the MRI.  But I will check lipid panel, HbA1c, echocardiogram and carotid ultrasound. Patient is taking Eliquis at 2.5 mg twice a day.  Will increase to full dose of 5 mg twice a day for now. Neurology consult is a placed.  Paroxysmal atrial fibrillation. Pacemaker. Reviewed the EKG, irregular and paced rhythm.  No current tachycardia, continue anticoagulation.  Parkinson dementia. Continue home medicines.  Chronic kidney disease stage IIIa. Hypokalemia. Renal function stable, replete potassium.  Recheck level tomorrow.     Advance Care Planning:   Code Status: DNR  Patient states that his CODE STATUS is DO NOT RESUSCITATE.  Consults: Neurology  Family Communication: Not able  to reach patient's son.  Severity of Illness: The appropriate patient status for this patient is OBSERVATION. Observation status is judged to be reasonable and necessary in order to provide the required intensity of service to ensure the patient's safety. The patient's presenting symptoms, physical exam findings, and initial radiographic and laboratory data in the context of their medical condition is felt to place them at decreased risk for further clinical deterioration. Furthermore, it is anticipated that the patient will be medically stable for discharge from the hospital within 2 midnights of admission.   Author: Sharen Hones, MD 05/02/2022 5:53 PM  For on call review [email or web address].

## 2022-05-02 NOTE — ED Provider Triage Note (Signed)
  Emergency Medicine Provider Triage Evaluation Note  Justin Hendrix , a 88 y.o.male,  was evaluated in triage.  Pt complains of sudden onset right-sided weakness and facial droop that started at approximately 1030 today.  Per EMS, patient symptoms resolved once they arrived.  He is currently asymptomatic at this time.   Review of Systems  Positive: Weakness, facial droop Negative: Denies fever, chest pain, vomiting  Physical Exam   Vitals:   05/02/22 1238  BP: 101/73  Pulse: 60  Resp: 16  Temp: 98 F (36.7 C)  SpO2: 98%   Gen:   Awake, no distress   Resp:  Normal effort  MSK:   Moves extremities without difficulty  Other:  No facial droop or gross neurological deficits at this time.  Medical Decision Making  Given the patient's initial medical screening exam, the following diagnostic evaluation has been ordered. The patient will be placed in the appropriate treatment space, once one is available, to complete the evaluation and treatment. I have discussed the plan of care with the patient and I have advised the patient that an ED physician or mid-level practitioner will reevaluate their condition after the test results have been received, as the results may give them additional insight into the type of treatment they may need.    Diagnostics: Labs, head CT  Treatments: none immediately   Teodoro Spray, [place] 05/02/22 1538

## 2022-05-02 NOTE — ED Triage Notes (Signed)
Pt comes via EMS from home with c/o right sided weakness and facial droop. EMS states when they arrived pt symptoms had resolved. Pt did have fall yesterday and unknown if he hit his head. Pt is on thinners.   VSS [id number]  Pt is at baseline.

## 2022-05-03 ENCOUNTER — Observation Stay
Admit: 2022-05-03 | Discharge: 2022-05-03 | Disposition: A | Discharge status: Home / Self Care | Payer: Medicare PPO | Attending: Internal Medicine | Admitting: Internal Medicine

## 2022-05-03 ENCOUNTER — Observation Stay: Payer: Medicare PPO

## 2022-05-03 DIAGNOSIS — N1831 Chronic kidney disease, stage 3a: Secondary | ICD-10-CM

## 2022-05-03 DIAGNOSIS — G20A1 Parkinson's disease without dyskinesia, without mention of fluctuations: Secondary | ICD-10-CM | POA: Diagnosis not present

## 2022-05-03 DIAGNOSIS — G459 Transient cerebral ischemic attack, unspecified: Secondary | ICD-10-CM | POA: Diagnosis not present

## 2022-05-03 LAB — LIPID PANEL
Cholesterol: 125 mg/dL (ref 0–200)
HDL: 34 mg/dL — ABNORMAL LOW (ref >40)
LDL Cholesterol: 78 mg/dL (ref 0–99)
Total CHOL/HDL Ratio: 3.7 RATIO
Triglycerides: 65 mg/dL (ref <150)
VLDL: 13 mg/dL (ref 0–40)

## 2022-05-03 LAB — ECHOCARDIOGRAM COMPLETE
AR max vel: 1.73 cm2
AV Area VTI: 2.27 cm2
AV Area mean vel: 2.13 cm2
AV Mean grad: 5.7 mmHg
AV Peak grad: 10 mmHg
Ao pk vel: 1.58 m/s
Area-P 1/2: 2.08 cm2
P 1/2 time: 475 msec
S' Lateral: 2.2 cm

## 2022-05-03 LAB — BASIC METABOLIC PANEL
Anion gap: 6 (ref 5–15)
BUN: 14 mg/dL (ref 8–23)
CO2: 23 mmol/L (ref 22–32)
Calcium: 7.6 mg/dL — ABNORMAL LOW (ref 8.9–10.3)
Chloride: 105 mmol/L (ref 98–111)
Creatinine, Ser: 1.34 mg/dL — ABNORMAL HIGH (ref 0.61–1.24)
GFR, Estimated: 48 mL/min — ABNORMAL LOW (ref >60)
Glucose, Bld: 95 mg/dL (ref 70–99)
Potassium: 3.3 mmol/L — ABNORMAL LOW (ref 3.5–5.1)
Sodium: 134 mmol/L — ABNORMAL LOW (ref 135–145)

## 2022-05-03 LAB — POTASSIUM: Potassium: 3.8 mmol/L (ref 3.5–5.1)

## 2022-05-03 LAB — MAGNESIUM: Magnesium: 1.8 mg/dL (ref 1.7–2.4)

## 2022-05-03 MED ORDER — POTASSIUM CHLORIDE 10 MEQ/100ML IV SOLN
10.0000 meq | INTRAVENOUS | Status: AC
  Administered 2022-05-03 (×3): 10 meq via INTRAVENOUS
  Filled 2022-05-03 (×3): qty 100

## 2022-05-03 MED ORDER — SODIUM CHLORIDE 0.9 % IV SOLN: INTRAVENOUS | Status: AC

## 2022-05-03 NOTE — Evaluation (Signed)
Physical Therapy Evaluation Patient Details Name: Justin Hendrix MRN: [id number] DOB: [birth year] Today's Date: 05/03/2022  History of Present Illness  Justin Hendrix is a 88 y.o. male with medical history significant of paroxysmal atrial fibrillation, Parkinson disease, history of TIA, depression, chronic kidney disease stage IIIa, who presents to the hospital complaining of right-sided weakness and facial droop.  Symptoms lasted a few hours and resolved.     Upon arriving the hospital, CT scan showed chronic small vessel disease with old stroke, no acute changes.  Potassium 3.4.   Clinical Impression  Pt admitted with above diagnosis. Pt received in care of NT's for bathing. 10 minutes spent of un-billable time while NT's finish bathing tasks. Pt demonstrating adequate AROM with RUE and RLE in sitting.  Reports accurate home lay out and PLOF despite history of cognitive deficits at baseline. At baseline pt is household Ambulator with RW. Indep with ADL's, relies on sons' help for medication management and transportation per EMR.   To date, pt completing STS to RW with x2 bouts of momentum and hands on arm rests to stand. Pt tolerating ~100' of gait with RW with RW outside pt's BOS. With minguard and VC's pt improves in RW placement with slowed, but consistent step through gait and truncal flexion. Falls risk with turns noted with poor sequencing of RW with LE's placed outside of RW requiring mod to max multimodal cuing on RW and VC's for foot placement with fair carryover to prevent tripping hazard.  Pt returning to recliner requiring VC's for hand placement. Pt endorses close to baseline, but ambulating at slowed cadence. Education provided on benefits of [facility] to return to PLOF and continued OOB mobility to maintain strength and independence with tasks. Pt with all needs in reach. Pt currently with functional limitations due to the deficits listed below (see PT Problem List). Pt will benefit from  skilled PT to increase their independence and safety with mobility to allow discharge to the venue listed below.      Recommendations for follow up therapy are one component of a multi-disciplinary discharge planning process, led by the attending physician.  Recommendations may be updated based on patient status, additional functional criteria and insurance authorization.  Follow Up Recommendations Home health PT      Assistance Recommended at Discharge Frequent or constant Supervision/Assistance  Patient can return home with the following  A little help with walking and/or transfers;Assistance with cooking/housework;Assist for transportation;Help with stairs or ramp for entrance;A little help with bathing/dressing/bathroom    Equipment Recommendations None recommended by PT  Recommendations for Other Services       Functional Status Assessment Patient has had a recent decline in their functional status and demonstrates the ability to make significant improvements in function in a reasonable and predictable amount of time.     Precautions / Restrictions Precautions Precautions: Fall Restrictions Weight Bearing Restrictions: No      Mobility  Bed Mobility               General bed mobility comments: NT. In recliner pre and post eval Patient Response: Cooperative  Transfers Overall transfer level: Needs assistance Equipment used: Rolling walker (2 wheels) Transfers: Sit to/from Stand Sit to Stand: Min guard           General transfer comment: x2 attempts and use of momentum to stand. No physical assist needed though    Ambulation/Gait Ambulation/Gait assistance: Min guard Gait Distance (Feet): 100 Feet Assistive device: Rolling walker (  2 wheels) Gait Pattern/deviations: Step-through pattern, Trunk flexed, Narrow base of support, Decreased step length - right, Decreased step length - left       General Gait Details: requires some VC's to maintain RW closer to  BOS. Turns with feet outside RW despite VC's for safety.  Stairs            Wheelchair Mobility    Modified Rankin (Stroke Patients Only)       Balance Overall balance assessment: Needs assistance Sitting-balance support: Feet supported, No upper extremity supported Sitting balance-Leahy Scale: Good     Standing balance support: Bilateral upper extremity supported, During functional activity Standing balance-Leahy Scale: Fair Standing balance comment: light UE support on RW                             Pertinent Vitals/Pain Pain Assessment Pain Assessment: No/denies pain    Home Living Family/patient expects to be discharged to:: Private residence Living Arrangements: Children;Spouse/significant other Available Help at Discharge: Family;Available 24 hours/day Type of Home: House Home Access: Ramped entrance       Home Layout: One level Home Equipment: Cane - single Librarian, academic (2 wheels)      Prior Function Prior Level of Function : Needs assist               ADLs Comments: Pt generally able to complete bathing, dressing, and toileting without assist. Sons assist with med mgt, transportation, meals and cleaning.     Hand Dominance   Dominant Hand: Right    Extremity/Trunk Assessment   Upper Extremity Assessment Upper Extremity Assessment: Generalized weakness    Lower Extremity Assessment Lower Extremity Assessment: Generalized weakness    Cervical / Trunk Assessment Cervical / Trunk Assessment: Kyphotic  Communication   Communication: HOH  Cognition Arousal/Alertness: Awake/alert Behavior During Therapy: WFL for tasks assessed/performed Overall Cognitive Status: History of cognitive impairments - at baseline                                 General Comments: Pleasant and cooperative. Reports accurate home lay out and PLOF as documented in EMR. Follows all commands.        General Comments General  comments (skin integrity, edema, etc.): HR in mid 80's post ambulation.    Exercises     Assessment/Plan    PT Assessment Patient needs continued PT services  PT Problem List         PT Treatment Interventions DME instruction;Therapeutic exercise;Gait training;Balance training;Neuromuscular re-education;Functional mobility training;Therapeutic activities;Patient/family education    PT Goals (Current goals can be found in the Care Plan section)  Acute Rehab PT Goals Patient Stated Goal: improve walking back to baseline PT Goal Formulation: With patient Time For Goal Achievement: 05/17/22 Potential to Achieve Goals: Good    Frequency Min 2X/week     Co-evaluation               AM-PAC PT "6 Clicks" Mobility  Outcome Measure Help needed turning from your back to your side while in a flat bed without using bedrails?: A Little Help needed moving from lying on your back to sitting on the side of a flat bed without using bedrails?: A Little Help needed moving to and from a bed to a chair (including a wheelchair)?: A Little Help needed standing up from a chair using your arms (e.g., wheelchair or bedside  chair)?: A Little Help needed to walk in hospital room?: A Little Help needed climbing 3-5 steps with a railing? : A Lot 6 Click Score: 17    End of Session Equipment Utilized During Treatment: Gait belt Activity Tolerance: Patient tolerated treatment well Patient left: in chair;with call bell/phone within reach;with chair alarm set Nurse Communication: Mobility status PT Visit Diagnosis: Other abnormalities of gait and mobility (R26.89);Muscle weakness (generalized) (M62.81)    Time: 2094-7096 PT Time Calculation (min) (ACUTE ONLY): 40 min   Charges:   PT Evaluation $PT Eval Low Complexity: 1 Low $PT Eval Moderate Complexity: 1 Mod PT Treatments $Gait Training: 8-22 mins        Delphia Grates. Fairly IV, PT, DPT Physical Therapist- [place]  [facility]  05/03/2022, 11:34 AM

## 2022-05-03 NOTE — TOC Transition Note (Signed)
Transition of Care [facility]) - CM/SW Discharge Note   Patient Details  Name: Justin Hendrix MRN: [id number] Date of Birth: March 27, 1923  Transition of Care [facility]) CM/SW Contact:  Laurena Slimmer, RN Phone Number: 05/03/2022, 4:57 PM   Clinical Narrative:    Spoke with patient's son regarding discharge plan. Patient son agreeable to [facility] He did not have a preference. Referral sent and received by [place] from [facility]. His son will transport him home. TOC signing off.    Final next level of care: Home w Home Health Services Barriers to Discharge: Barriers Resolved   Patient Goals and CMS Choice   CMS Medicare.gov Compare Post Acute Care list provided to:: Patient Represenative (must comment) Choice offered to / list presented to : Adult Children  Discharge Placement                  Name of family member notified: Marcy Siren    Discharge Plan and Services                          HH Arranged: PT, OT [facility] Agency: [place] Date [place]: 05/03/22 Time [place]: [phone] Representative spoke with at [place]: [place]  Social Determinants of Health ([place]) Interventions     Readmission Risk Interventions     No data to display

## 2022-05-03 NOTE — Progress Notes (Signed)
Pt with witnessed fall. No injury noted. No changes to shift assessment. MD notified. No new orders. Attempted to notify son, voice mail left asking to call RN at [phone]. Son later notified at bedside. Continuing to monitor.   05/03/22 1320  What Happened  Was fall witnessed? Yes  Who witnessed fall? Damien [place]  Patients activity before fall ambulating-unassisted  Point of contact buttocks  Was patient injured? No  Patient found on floor  Stated prior activity other (comment) (pt states he's ready to go home)  Follow Up  MD notified Sharen Hones  Time MD notified 53  Family notified Yes - comment (voicemail left for Mr. Marcy Siren to contact Regino Schultze at [phone])  Additional tests No  Adult Fall Risk Assessment  Risk Factor Category (scoring not indicated) Fall has occurred during this admission (document High fall risk);History of more than one fall within 6 months before admission (document High fall risk)  Patient Fall Risk Level High fall risk  Adult Fall Risk Interventions  Required Bundle Interventions *See Row Information* High fall risk - low, moderate, and high requirements implemented  Additional Interventions Room near nurses station  Screening for Fall Injury Risk (To be completed on HIGH fall risk patients) - Assessing Need for Floor Mats  Risk For Fall Injury- Criteria for Floor Mats None identified - No additional interventions needed  Vitals  Temp 97.6 F (36.4 C)  BP (!) 192/97  MAP (mmHg) 121  BP Location Right Arm  BP Method Automatic  Patient Position (if appropriate) Lying  Pulse Rate 67  Pulse Rate Source Dinamap  Resp 19  Oxygen Therapy  SpO2 93 %  O2 Device Room Air  Pain Assessment  Pain Scale 0-10  Pain Score 0  PCA/Epidural/Spinal Assessment  Respiratory Pattern Regular;Unlabored  Neurological  Level of Consciousness Alert  Orientation Level Oriented to person;Oriented to situation  NIH Stroke Scale ( + Modified Stroke  Scale Criteria)   Interval Shift assessment  Level of Consciousness (1a.)    0  LOC Questions (1b. )   + 0  LOC Commands (1c. )   +  0  Best Gaze (2. )  + 0  Visual (3. )  + 0  Facial Palsy (4. )     0  Motor Arm, Left (5a. )   + 0  Motor Arm, Right (5b. )   + 0  Motor Leg, Left (6a. )   + 0  Motor Leg, Right (6b. )   + 0  Limb Ataxia (7. ) 0  Sensory (8. )   + 0  Best Language (9. )   + 1  Dysarthria (10. ) 0  Extinction/Inattention (11.)   + 0  Modified SS Total  + 1  Complete NIHSS TOTAL 1  Musculoskeletal  Musculoskeletal (WDL) X  Assistive Device Front wheel walker  Generalized Weakness Yes  Integumentary  Integumentary (WDL) X  Skin Integrity Abrasion  Abrasion Location Arm;Back  Abrasion Location Orientation Right;Left (middle back)

## 2022-05-03 NOTE — Discharge Summary (Signed)
Physician Discharge Summary   Patient: Justin Hendrix: [id number] DOB: [birth year]  Admit date:     05/02/2022  Discharge date: 05/03/22  Discharge Physician: Sharen Hones   PCP: Albina Billet, MD   Recommendations at discharge:   Follow-up with PCP in 1 week Follow-up with neurology in 1 month.  Discharge Diagnoses: Principal Problem:   TIA (transient ischemic attack) Active Problems:   Parkinson's disease ([place])   Paroxysmal atrial fibrillation (HCC)   GERD without esophagitis   Depression   Chronic kidney disease, stage 3a (HCC)   Hypokalemia  Resolved Problems:   * No resolved hospital problems. *  Hospital Course: Justin Hendrix is a 88 y.o. male with medical history significant of paroxysmal atrial fibrillation, Parkinson disease, history of TIA, depression, chronic kidney disease stage IIIa, who presents to the hospital complaining of right-sided weakness and facial droop.  Symptoms lasted a few hours and resolved. Patient condition is consistent with TIA, could not perform MRI due to pacemaker.  Carotid ultrasound did not show significant occlusion.  Repeated head CT on 10/10 did not show any acute stroke. Patient is not a candidate for statin treatment due to advanced age. Patient condition is more likely caused by thromboembolic event due to atrial fibrillation.  Anticoagulation with Eliquis was restarted, adjusted to renal dose. At this point, he is medically stable to be discharged.  Assessment and Plan: TIA. Paroxysmal atrial fibrillation. Pacemaker. Patient has been seen by neurology, cleared for discharge. Patient symptom has resolved, will continue anticoagulation with Eliquis.  Based on renal function, patient will be on 2.5 mg twice a day. Echocardiogram showed EF 45-50% with regional abnormality, No valvular stenosis.  Patient will continue to follow with PCP and neurology as outpatient. Due to advanced age, statin will not be beneficial.    Parkinson dementia. Continue home medicines.   Chronic kidney disease stage IIIa. Hypokalemia. Mild hyponatremia. Renal function stable.  Potassium has normalized.          Consultants: Neurology Procedures performed: None  Disposition: Home Diet recommendation:  Cardiac diet DISCHARGE MEDICATION: Allergies as of 05/03/2022   No Known Allergies      Medication List     TAKE these medications    Amantadine HCl 100 MG tablet Take 100 mg by mouth daily.   apixaban 2.5 MG Tabs tablet Commonly known as: ELIQUIS Take 1 tablet (2.5 mg total) by mouth 2 (two) times daily.   atenolol 50 MG tablet Commonly known as: TENORMIN Take 1 tablet (50 mg) by mouth once daily   budesonide-formoterol 160-4.5 MCG/ACT inhaler Commonly known as: SYMBICORT Inhale 2 puffs into the lungs 2 (two) times daily as needed (respiratory difficulty).   docusate sodium 100 MG capsule Commonly known as: COLACE Take 1 capsule (100 mg total) by mouth 2 (two) times daily. What changed:  when to take this reasons to take this   finasteride 5 MG tablet Commonly known as: PROSCAR Take 1 tablet (5 mg total) by mouth daily.   fluticasone 50 MCG/ACT nasal spray Commonly known as: FLONASE 2 sprays by Each Nare route daily. as needed   pantoprazole 40 MG tablet Commonly known as: PROTONIX Take 40 mg by mouth daily.   tamsulosin 0.4 MG Caps capsule Commonly known as: FLOMAX TAKE 1 CAPSULE BY MOUTH ONCE DAILY 30 MINUTES AFTER LARGEST MEAL.        Follow-up Information     Albina Billet, MD Follow up in 1 week(s).   Specialty: Internal  Medicine Contact information: [address]   [place] [address] [phone]         Antonieta Iba, MD .   Specialty: Cardiology Contact information: [address] [place] [address] [phone]         [facility] Follow up in 1 month(s).   Contact information: [address] [place] [address] [phone]               Discharge Exam: There were no vitals filed for this visit. General exam: Appears calm and comfortable  Respiratory system: Clear to auscultation. Respiratory effort normal. Cardiovascular system: S1 & S2 heard, RRR. No JVD, murmurs, rubs, gallops or clicks. No pedal edema. Gastrointestinal system: Abdomen is nondistended, soft and nontender. No organomegaly or masses felt. Normal bowel sounds heard. Central nervous system: Alert and oriented. No focal neurological deficits. Extremities: Symmetric 5 x 5 power. Skin: No rashes, lesions or ulcers Psychiatry:  Mood & affect appropriate.    Condition at discharge: good  The results of significant diagnostics from this hospitalization (including imaging, microbiology, ancillary and laboratory) are listed below for reference.   Imaging Studies: CT HEAD WO CONTRAST ( )  Result Date: 05/03/2022 CLINICAL DATA:  Stroke follow up EXAM: CT HEAD WITHOUT CONTRAST TECHNIQUE: Contiguous axial images were obtained from the base of the skull through the vertex without intravenous contrast. RADIATION DOSE REDUCTION: This exam was performed according to the departmental dose-optimization program which includes automated exposure control, adjustment of the mA and/or kV according to patient size and/or use of iterative reconstruction technique. COMPARISON:  CT Head 05/02/22 FINDINGS: Brain: No evidence of acute infarction, hemorrhage, hydrocephalus, extra-axial collection or mass lesion/mass effect. Sequela of severe chronic microvascular ischemic change with chronic left thalamic and basal ganglia lacunar infarcts. Advanced generalized volume loss. Vascular: No hyperdense vessel or unexpected calcification. Skull: Normal. Negative for fracture or focal lesion. Degenerative changes at the left TMJ. Sinuses/Orbits: Mucosal thickening left frontal sinus. Redemonstrated right-sided mastoid  effusion. Bilateral lens replacements. Other: None. IMPRESSION: 1. No acute intracranial abnormality. 2. Sequela of severe chronic microvascular ischemic change with chronic left thalamic and basal ganglia lacunar infarcts. 3. Redemonstrated right-sided mastoid effusion. Electronically Signed   By: Lorenza Cambridge M.D.   On: 05/03/2022 13:19   US Carotid Bilateral (at [facility] and [facility] only)  Result Date: 05/02/2022 CLINICAL DATA:  Transient ischemic attack.  History of tobacco use EXAM: BILATERAL CAROTID DUPLEX ULTRASOUND TECHNIQUE: Wallace Cullens scale imaging, color Doppler and duplex ultrasound were performed of bilateral carotid and vertebral arteries in the neck. COMPARISON:  11/29/2007 FINDINGS: Criteria: Quantification of carotid stenosis is based on velocity parameters that correlate the residual internal carotid diameter with NASCET-based stenosis levels, using the diameter of the distal internal carotid lumen as the denominator for stenosis measurement. The following velocity measurements were obtained (PSV/EDV): RIGHT ICA: 34/9 cm/sec CCA: 50/6 cm/sec SYSTOLIC ICA/CCA RATIO:  0.7 ECA: 76 cm/sec LEFT ICA: 56/21 cm/sec CCA: 46/7 cm/sec SYSTOLIC ICA/CCA RATIO:  1.2 ECA: 51 cm/sec RIGHT CAROTID ARTERY: Normal color flow and velocity waveforms. Bifurcation is patent without significant calcific or noncalcific stenosis. RIGHT VERTEBRAL ARTERY:  Patent with antegrade flow. LEFT CAROTID ARTERY: Normal color flow and velocity waveforms. Bifurcation is patent without significant calcific or noncalcific stenosis. LEFT VERTEBRAL ARTERY:  Patent with antegrade flow. Upper extremity blood pressures: Not obtained. IMPRESSION: No evidence of hemodynamically significant stenosis of either right or left internal carotid artery. Electronically Signed  By: Burman Nieves M.D.   On: 05/02/2022 19:22   CT HEAD WO CONTRAST  Result Date: 05/02/2022 CLINICAL DATA:  Provided history: Weakness. Additional history provided: Right-sided  weakness and facial droop. Fall yesterday. EXAM: CT HEAD WITHOUT CONTRAST TECHNIQUE: Contiguous axial images were obtained from the base of the skull through the vertex without intravenous contrast. RADIATION DOSE REDUCTION: This exam was performed according to the departmental dose-optimization program which includes automated exposure control, adjustment of the mA and/or kV according to patient size and/or use of iterative reconstruction technique. COMPARISON:  Prior head CT examinations 05/02/2022 and earlier. FINDINGS: Brain: Moderate to moderately advanced cerebral atrophy. Moderate patchy and ill-defined hypoattenuation within the cerebral white matter, nonspecific but compatible with chronic small vessel ischemic disease. Chronic lacunar infarcts within the left basal ganglia and bilateral thalami. Redemonstrated small chronic infarct within the left cerebellar hemisphere. There is no acute intracranial hemorrhage. No demarcated cortical infarct. No extra-axial fluid collection. No evidence of an intracranial mass. No midline shift. Vascular: No hyperdense vessel. Atherosclerotic calcifications. Skull: No fracture or aggressive osseous lesion. Sinuses/Orbits: No mass or acute finding within the imaged orbits. Extensive partial opacification of the left frontal sinus due to the presence of mucosal thickening and fluid. 8 mm mucous retention cyst within the right frontoethmoidal recess. Minimal mucosal thickening and fluid scattered within the bilateral ethmoid air cells. Minimal mucosal thickening within the right sphenoid sinus, and within the right maxillary sinus at the imaged levels. Other: Small right mastoid effusion. IMPRESSION: 1. No evidence of acute intracranial abnormality. 2. Chronic small vessel ischemic disease with chronic infarcts, as described and stable from the prior head CT of 04/03/2022. 3. Moderate to moderately advance cerebral atrophy. 4. Paranasal sinus disease at the imaged levels, as  described. 5. Small right mastoid effusion. Electronically Signed   By: Jackey Loge D.O.   On: 05/02/2022 13:27   DG Chest Port 1 View  Result Date: 04/03/2022 CLINICAL DATA:  Syncope EXAM: PORTABLE CHEST 1 VIEW COMPARISON:  10/15/2021 FINDINGS: Left-sided implanted cardiac device. Stable mild cardiomegaly. Probable hiatal hernia. Aortic atherosclerosis. No focal airspace consolidation, pleural effusion, or pneumothorax. IMPRESSION: No active disease. Electronically Signed   By: Duanne Guess D.O.   On: 04/03/2022 17:12   CT Head Wo Contrast  Result Date: 04/03/2022 CLINICAL DATA:  88 year old male with altered mental status and syncope. EXAM: CT HEAD WITHOUT CONTRAST TECHNIQUE: Contiguous axial images were obtained from the base of the skull through the vertex without intravenous contrast. RADIATION DOSE REDUCTION: This exam was performed according to the departmental dose-optimization program which includes automated exposure control, adjustment of the mA and/or kV according to patient size and/or use of iterative reconstruction technique. COMPARISON:  10/15/2021 and prior CTs FINDINGS: Brain: No evidence of acute infarction, hemorrhage, hydrocephalus, extra-axial collection or mass lesion/mass effect. Atrophy, chronic small-vessel white matter ischemic changes and remote basal ganglia and thalamic lacunar infarcts again noted. Vascular: Carotid and vertebral atherosclerotic calcifications are noted. Skull: Normal. Negative for fracture or focal lesion. Sinuses/Orbits: No acute finding. Other: None. IMPRESSION: 1. No evidence of acute intracranial abnormality. 2. Atrophy, chronic small-vessel white matter ischemic changes and remote lacunar infarcts. Electronically Signed   By: Harmon Pier M.D.   On: 04/03/2022 16:56    Microbiology: Results for orders placed or performed during the hospital encounter of 10/15/21  Resp Panel by RT-PCR (Flu A&B, Covid) Nasopharyngeal Swab     Status: None    Collection Time: 10/15/21  2:56 PM  Specimen: Nasopharyngeal Swab; Nasopharyngeal(NP) swabs in vial transport medium  Result Value Ref Range Status   SARS Coronavirus 2 by RT PCR NEGATIVE NEGATIVE Final    Comment: (NOTE) SARS-CoV-2 target nucleic acids are NOT DETECTED.  The SARS-CoV-2 RNA is generally detectable in upper respiratory specimens during the acute phase of infection. The lowest concentration of SARS-CoV-2 viral copies this assay can detect is 138 copies/mL. A negative result does not preclude SARS-Cov-2 infection and should not be used as the sole basis for treatment or other patient management decisions. A negative result may occur with  improper specimen collection/handling, submission of specimen other than nasopharyngeal swab, presence of viral mutation(s) within the areas targeted by this assay, and inadequate number of viral copies(<138 copies/mL). A negative result must be combined with clinical observations, patient history, and epidemiological information. The expected result is Negative.  Fact Sheet for Patients:  BloggerCourse.com  Fact Sheet for Healthcare Providers:  SeriousBroker.it  This test is no t yet approved or cleared by the [place] FDA and  has been authorized for detection and/or diagnosis of SARS-CoV-2 by FDA under an Emergency Use Authorization (EUA). This EUA will remain  in effect (meaning this test can be used) for the duration of the COVID-19 declaration under Section 564(b)(1) of the Act, 21 U.S.C.section 360bbb-3(b)(1), unless the authorization is terminated  or revoked sooner.       Influenza A by PCR NEGATIVE NEGATIVE Final   Influenza B by PCR NEGATIVE NEGATIVE Final    Comment: (NOTE) The Xpert Xpress SARS-CoV-2/FLU/RSV plus assay is intended as an aid in the diagnosis of influenza from Nasopharyngeal swab specimens and should not be used as a sole basis for treatment.  Nasal washings and aspirates are unacceptable for Xpert Xpress SARS-CoV-2/FLU/RSV testing.  Fact Sheet for Patients: BloggerCourse.com  Fact Sheet for Healthcare Providers: SeriousBroker.it  This test is not yet approved or cleared by the [place] FDA and has been authorized for detection and/or diagnosis of SARS-CoV-2 by FDA under an Emergency Use Authorization (EUA). This EUA will remain in effect (meaning this test can be used) for the duration of the COVID-19 declaration under Section 564(b)(1) of the Act, 21 U.S.C. section 360bbb-3(b)(1), unless the authorization is terminated or revoked.  Performed at [facility], [address] [place], [place] [address]     Labs: CBC: Recent Labs  Lab 05/02/22 1239  WBC 6.7  NEUTROABS 3.6  HGB 12.9*  HCT 38.9*  MCV 87.0  PLT 194   Basic Metabolic Panel: Recent Labs  Lab 05/02/22 1239 05/03/22 0442 05/03/22 1447  NA 135 134*  --   K 3.4* 3.3* 3.8  CL 102 105  --   CO2 22 23  --   GLUCOSE 86 95  --   BUN 12 14  --   CREATININE 1.36* 1.34*  --   CALCIUM 8.1* 7.6*  --   MG  --  1.8  --    Liver Function Tests: Recent Labs  Lab 05/02/22 1239  AST 29  ALT 8  ALKPHOS 278*  BILITOT 1.2  PROT 5.8*  ALBUMIN 2.5*   CBG: No results for input(s): "GLUCAP" in the last 168 hours.  Discharge time spent: greater than 30 minutes.  Signed: Marrion Coy, MD [facility] 05/03/2022

## 2022-05-03 NOTE — Evaluation (Signed)
Occupational Therapy Evaluation Patient Details Name: Justin Hendrix MRN: [id number] DOB: May 09, 1923 Today's Date: 05/03/2022   History of Present Illness Justin Hendrix is a 88 y.o. male with medical history significant of paroxysmal atrial fibrillation, Parkinson disease, history of TIA, depression, chronic kidney disease stage IIIa, who presents to the hospital complaining of right-sided weakness and facial droop.  Symptoms lasted a few hours and resolved.     Upon arriving the hospital, CT scan showed chronic small vessel disease with old stroke, no acute changes.  Potassium 3.4.   Clinical Impression   Patient presenting with decreased independence in self-care, functional mobility, safety, and cognition. Patient in bed upon arrival and agreeable to OT services. Patient reports he lives at home with his wife and son. Patient with a history of cognitive deficits, but able to provide some PLOF. At baseline patient reports independent with ADLs and requires assistance for medication and transportation. Patient currently functioning at mod I for bed mobility (HOB raised) min A for sit > stand. Patient was able to take a few steps to transfer to recliner. Patient very close to baseline. Set up A of breakfast tray at end of session. Patient left in recliner with chair alarm set, call bell in reach, and all needs met. Patient will benefit from acute OT to increase overall independence in the areas of ADLs, functional mobility, in order to safely discharge home.         Recommendations for follow up therapy are one component of a multi-disciplinary discharge planning process, led by the attending physician.  Recommendations may be updated based on patient status, additional functional criteria and insurance authorization.   Follow Up Recommendations  Home health OT    Assistance Recommended at Discharge Frequent or constant Supervision/Assistance  Patient can return home with the following A  little help with walking and/or transfers;A little help with bathing/dressing/bathroom;Assist for transportation;Direct supervision/assist for financial management;Assistance with cooking/housework;Help with stairs or ramp for entrance    Functional Status Assessment  Patient has had a recent decline in their functional status and demonstrates the ability to make significant improvements in function in a reasonable and predictable amount of time.  Equipment Recommendations  None recommended by OT    Recommendations for Other Services       Precautions / Restrictions Precautions Precautions: Fall Restrictions Weight Bearing Restrictions: No      Mobility Bed Mobility Overal bed mobility: Modified Independent             General bed mobility comments: HOB raised    Transfers Overall transfer level: Needs assistance Equipment used:  (IV stand) Transfers: Sit to/from Stand Sit to Stand: Min assist                  Balance Overall balance assessment: Needs assistance Sitting-balance support: Feet supported, No upper extremity supported Sitting balance-Leahy Scale: Good     Standing balance support: Bilateral upper extremity supported, During functional activity Standing balance-Leahy Scale: Fair                             ADL either performed or assessed with clinical judgement   ADL                                               Vision Baseline Vision/History:  1 Wears glasses              Pertinent Vitals/Pain Pain Assessment Pain Assessment: No/denies pain     Hand Dominance Right   Extremity/Trunk Assessment Upper Extremity Assessment Upper Extremity Assessment: Generalized weakness   Lower Extremity Assessment Lower Extremity Assessment: Generalized weakness   Cervical / Trunk Assessment Cervical / Trunk Assessment: Kyphotic   Communication Communication Communication: HOH   Cognition Arousal/Alertness:  Awake/alert Behavior During Therapy: WFL for tasks assessed/performed Overall Cognitive Status: History of cognitive impairments - at baseline                                       General Comments  HR in mid 80's post ambulation.            Home Living Family/patient expects to be discharged to:: Private residence Living Arrangements: Children;Spouse/significant other Available Help at Discharge: Family;Available 24 hours/day Type of Home: House Home Access: Ramped entrance     Home Layout: One level     Bathroom Shower/Tub: Teacher, early years/pre: Standard     Home Equipment: Cane - single Barista (2 wheels)          Prior Functioning/Environment Prior Level of Function : Needs assist               ADLs Comments: Pt generally able to complete bathing, dressing, and toileting without assist. Sons assist with med mgt, transportation, meals and cleaning.        OT Problem List: Decreased strength;Decreased cognition;Decreased safety awareness;Decreased activity tolerance      OT Treatment/Interventions: Self-care/ADL training;Therapeutic exercise;Patient/family education;Therapeutic activities    OT Goals(Current goals can be found in the care plan section) Acute Rehab OT Goals Patient Stated Goal: to return home. OT Goal Formulation: With patient Time For Goal Achievement: 05/03/22 Potential to Achieve Goals: Good ADL Goals Pt Will Perform Lower Body Bathing: with modified independence Pt Will Perform Lower Body Dressing: with modified independence Pt Will Transfer to Toilet: with modified independence Pt Will Perform Toileting - Clothing Manipulation and hygiene: with modified independence  OT Frequency: Min 2X/week       AM-PAC OT "6 Clicks" Daily Activity     Outcome Measure Help from another person eating meals?: None Help from another person taking care of personal grooming?: A Little Help from another  person toileting, which includes using toliet, bedpan, or urinal?: A Little Help from another person bathing (including washing, rinsing, drying)?: A Little Help from another person to put on and taking off regular upper body clothing?: None Help from another person to put on and taking off regular lower body clothing?: A Little 6 Click Score: 20   End of Session Nurse Communication: Mobility status  Activity Tolerance: Patient tolerated treatment well Patient left: in chair;with nursing/sitter in room;with chair alarm set;with call bell/phone within reach  OT Visit Diagnosis: Muscle weakness (generalized) (M62.81)                Time: 0017-4944 OT Time Calculation (min): 18 min Charges:  OT General Charges $OT Visit: 1 Visit OT Evaluation $OT Eval Low Complexity: 1 Low    Gregory Barrick, OTS 05/03/2022, 1:24 PM

## 2022-05-03 NOTE — Progress Notes (Signed)
SLP Cancellation Note  Patient Details Name: Justin Hendrix MRN: [id number] DOB: [birth year]   Cancelled treatment:       Reason Eval/Treat Not Completed:  (chart reviewed; consulted NSG then met w/ pt.) Met w/ pt in room; pt sitting in chair. Upon asking him about the breakfast tray in room, he adamantly denied that it was his tray and stated "I did not eat breakfast this morning - that's not my food". After sitting w/ him and offering him snacks to include chocolate milk (he stated he liked) and applesauce, pt conversed w/ this Clinician more opening and easily. Noted reduced insight and some Confusion in his engagement/responses. Pt has a Baseline of Parkinson disease and Cognitive decline per chart notes.   Pt denied any difficulty swallowing and is currently on a regular diet per MD orders; sipped on chocolate milk w/ no immediate, overt s/s of aspiration noted during visit.  Pt was easily distracted but conversed in basic conversation w/ fairly intelligible speech w/ this SLP, and he could answer basic questions re: self (likes/dislikes, name/DOB). He repeatedly asked where his Son was; redirected pt saying Son would visit in the PM. Pt spoke w/ Neurologist during this visit also.  Recommend pt return home to known setting and post Rest and lessening of acuity of illness, IF Family determine that pt is having Cognitive-linguistic decline from his norm, then to f/u w/ pt's PCP for referral to skilled ST services then to address any needs. It is difficult to determine if pt is different from his Baseline at this time, especially in Acute setting and w/out Rest and Family.  Neurologist updated and agreed. NSG updated and will reconsult if any change/decline in current status while admitted.        Orinda Kenner, MS, CCC-SLP Speech Language Pathologist Rehab Services; [place] [phone] (ascom) Eion Timbrook 05/03/2022, 4:42 PM

## 2022-05-03 NOTE — Hospital Course (Addendum)
Justin Hendrix is a 88 y.o. male with medical history significant of paroxysmal atrial fibrillation, Parkinson disease, history of TIA, depression, chronic kidney disease stage IIIa, who presents to the hospital complaining of right-sided weakness and facial droop.  Symptoms lasted a few hours and resolved. Patient condition is consistent with TIA, could not perform MRI due to pacemaker.  Carotid ultrasound did not show significant occlusion.  Repeated head CT on 10/10 did not show any acute stroke. Patient is not a candidate for statin treatment due to advanced age. Patient condition is more likely caused by thromboembolic event due to atrial fibrillation.  Anticoagulation with Eliquis was restarted, adjusted to renal dose. At this point, he is medically stable to be discharged.

## 2022-05-03 NOTE — Progress Notes (Signed)
*  PRELIMINARY RESULTS* Echocardiogram 2D Echocardiogram has been performed.  Justin Hendrix 05/03/2022, 10:51 AM

## 2022-05-04 ENCOUNTER — Ambulatory Visit (INDEPENDENT_AMBULATORY_CARE_PROVIDER_SITE_OTHER): Payer: Medicare PPO

## 2022-05-04 DIAGNOSIS — I442 Atrioventricular block, complete: Secondary | ICD-10-CM

## 2022-05-04 LAB — CUP PACEART REMOTE DEVICE CHECK
Battery Remaining Longevity: 47 mo
Battery Remaining Percentage: 50 %
Battery Voltage: 2.98 V
Brady Statistic AP VP Percent: 52 %
Brady Statistic AP VS Percent: 1 %
Brady Statistic AS VP Percent: 46 %
Brady Statistic AS VS Percent: 1 %
Brady Statistic RA Percent Paced: 50 %
Brady Statistic RV Percent Paced: 98 %
Date Time Interrogation Session: 20231011022415
Implantable Lead Implant Date: 20190412
Implantable Lead Implant Date: 20190412
Implantable Lead Location: 753859
Implantable Lead Location: 753860
Implantable Pulse Generator Implant Date: 20190412
Lead Channel Impedance Value: 400 Ohm
Lead Channel Impedance Value: 410 Ohm
Lead Channel Pacing Threshold Amplitude: 0.5 V
Lead Channel Pacing Threshold Amplitude: 0.75 V
Lead Channel Pacing Threshold Pulse Width: 0.5 ms
Lead Channel Pacing Threshold Pulse Width: 0.5 ms
Lead Channel Sensing Intrinsic Amplitude: 1 mV
Lead Channel Sensing Intrinsic Amplitude: 6.6 mV
Lead Channel Setting Pacing Amplitude: 2 V
Lead Channel Setting Pacing Amplitude: 2.5 V
Lead Channel Setting Pacing Pulse Width: 0.5 ms
Lead Channel Setting Sensing Sensitivity: 2 mV
Pulse Gen Model: 2272
Pulse Gen Serial Number: 9009871

## 2022-05-10 ENCOUNTER — Observation Stay
Admission: EM | Admit: 2022-05-10 | Discharge: 2022-05-12 | Disposition: A | Discharge status: Home Health | Payer: Medicare PPO | Attending: Internal Medicine | Admitting: Internal Medicine

## 2022-05-10 ENCOUNTER — Emergency Department: Payer: Medicare PPO

## 2022-05-10 DIAGNOSIS — Z950 Presence of cardiac pacemaker: Secondary | ICD-10-CM | POA: Diagnosis not present

## 2022-05-10 DIAGNOSIS — G459 Transient cerebral ischemic attack, unspecified: Principal | ICD-10-CM

## 2022-05-10 DIAGNOSIS — I639 Cerebral infarction, unspecified: Secondary | ICD-10-CM

## 2022-05-10 DIAGNOSIS — R29810 Facial weakness: Secondary | ICD-10-CM | POA: Diagnosis present

## 2022-05-10 DIAGNOSIS — Z87891 Personal history of nicotine dependence: Secondary | ICD-10-CM | POA: Diagnosis not present

## 2022-05-10 DIAGNOSIS — Z79899 Other long term (current) drug therapy: Secondary | ICD-10-CM | POA: Diagnosis not present

## 2022-05-10 DIAGNOSIS — Z8673 Personal history of transient ischemic attack (TIA), and cerebral infarction without residual deficits: Secondary | ICD-10-CM | POA: Diagnosis not present

## 2022-05-10 DIAGNOSIS — N1831 Chronic kidney disease, stage 3a: Secondary | ICD-10-CM | POA: Insufficient documentation

## 2022-05-10 DIAGNOSIS — R748 Abnormal levels of other serum enzymes: Secondary | ICD-10-CM | POA: Diagnosis not present

## 2022-05-10 DIAGNOSIS — G20A1 Parkinson's disease without dyskinesia, without mention of fluctuations: Secondary | ICD-10-CM | POA: Diagnosis not present

## 2022-05-10 DIAGNOSIS — Z7901 Long term (current) use of anticoagulants: Secondary | ICD-10-CM | POA: Insufficient documentation

## 2022-05-10 DIAGNOSIS — I480 Paroxysmal atrial fibrillation: Secondary | ICD-10-CM

## 2022-05-10 DIAGNOSIS — N401 Enlarged prostate with lower urinary tract symptoms: Secondary | ICD-10-CM | POA: Diagnosis present

## 2022-05-10 DIAGNOSIS — K219 Gastro-esophageal reflux disease without esophagitis: Secondary | ICD-10-CM | POA: Diagnosis present

## 2022-05-10 LAB — COMPREHENSIVE METABOLIC PANEL
ALT: 7 U/L (ref 0–44)
AST: 28 U/L (ref 15–41)
Albumin: 2.1 g/dL — ABNORMAL LOW (ref 3.5–5.0)
Alkaline Phosphatase: 287 U/L — ABNORMAL HIGH (ref 38–126)
Anion gap: 8 (ref 5–15)
BUN: 14 mg/dL (ref 8–23)
CO2: 24 mmol/L (ref 22–32)
Calcium: 7.8 mg/dL — ABNORMAL LOW (ref 8.9–10.3)
Chloride: 104 mmol/L (ref 98–111)
Creatinine, Ser: 1.26 mg/dL — ABNORMAL HIGH (ref 0.61–1.24)
GFR, Estimated: 52 mL/min — ABNORMAL LOW (ref >60)
Glucose, Bld: 140 mg/dL — ABNORMAL HIGH (ref 70–99)
Potassium: 4.5 mmol/L (ref 3.5–5.1)
Sodium: 136 mmol/L (ref 135–145)
Total Bilirubin: 1.1 mg/dL (ref 0.3–1.2)
Total Protein: 4.9 g/dL — ABNORMAL LOW (ref 6.5–8.1)

## 2022-05-10 LAB — CBC
HCT: 36.7 % — ABNORMAL LOW (ref 39.0–52.0)
Hemoglobin: 11.9 g/dL — ABNORMAL LOW (ref 13.0–17.0)
MCH: 29.3 pg (ref 26.0–34.0)
MCHC: 32.4 g/dL (ref 30.0–36.0)
MCV: 90.4 fL (ref 80.0–100.0)
Platelets: 211 10*3/uL (ref 150–400)
RBC: 4.06 MIL/uL — ABNORMAL LOW (ref 4.22–5.81)
RDW: 18.2 % — ABNORMAL HIGH (ref 11.5–15.5)
WBC: 8 10*3/uL (ref 4.0–10.5)
nRBC: 0 % (ref 0.0–0.2)

## 2022-05-10 LAB — GLUCOSE, CAPILLARY: Glucose-Capillary: 91 mg/dL (ref 70–99)

## 2022-05-10 LAB — DIFFERENTIAL
Abs Immature Granulocytes: 0.04 10*3/uL (ref 0.00–0.07)
Basophils Absolute: 0.1 10*3/uL (ref 0.0–0.1)
Basophils Relative: 1 %
Eosinophils Absolute: 0.2 10*3/uL (ref 0.0–0.5)
Eosinophils Relative: 3 %
Immature Granulocytes: 1 %
Lymphocytes Relative: 21 %
Lymphs Abs: 1.6 10*3/uL (ref 0.7–4.0)
Monocytes Absolute: 0.3 10*3/uL (ref 0.1–1.0)
Monocytes Relative: 4 %
Neutro Abs: 5.7 10*3/uL (ref 1.7–7.7)
Neutrophils Relative %: 70 %

## 2022-05-10 LAB — ETHANOL: Alcohol, Ethyl (B): <10 mg/dL (ref <10)

## 2022-05-10 LAB — PROTIME-INR
INR: 2 — ABNORMAL HIGH (ref 0.8–1.2)
Prothrombin Time: 22.6 seconds — ABNORMAL HIGH (ref 11.4–15.2)

## 2022-05-10 LAB — APTT: aPTT: 29 seconds (ref 24–36)

## 2022-05-10 MED ORDER — IOHEXOL 350 MG/ML SOLN
75.0000 mL | Freq: Once | INTRAVENOUS | Status: AC | PRN
  Administered 2022-05-10: 60 mL via INTRAVENOUS

## 2022-05-10 MED ORDER — ACETAMINOPHEN 160 MG/5ML PO SOLN: 650.0000 mg | ORAL | Status: DC | PRN

## 2022-05-10 MED ORDER — MOMETASONE FURO-FORMOTEROL FUM 200-5 MCG/ACT IN AERO
2.0000 | INHALATION_SPRAY | Freq: Two times a day (BID) | RESPIRATORY_TRACT | Status: DC
  Administered 2022-05-11 – 2022-05-12 (×3): 2 via RESPIRATORY_TRACT
  Filled 2022-05-10: qty 8.8

## 2022-05-10 MED ORDER — TAMSULOSIN HCL 0.4 MG PO CAPS
0.4000 mg | ORAL_CAPSULE | Freq: Every day | ORAL | Status: DC
  Administered 2022-05-11 – 2022-05-12 (×2): 0.4 mg via ORAL
  Filled 2022-05-10 (×2): qty 1

## 2022-05-10 MED ORDER — ACETAMINOPHEN 650 MG RE SUPP: 650.0000 mg | RECTAL | Status: DC | PRN

## 2022-05-10 MED ORDER — ACETAMINOPHEN 325 MG PO TABS: 650.0000 mg | ORAL_TABLET | ORAL | Status: DC | PRN

## 2022-05-10 MED ORDER — ATENOLOL 50 MG PO TABS
50.0000 mg | ORAL_TABLET | Freq: Every day | ORAL | Status: DC
  Administered 2022-05-11 – 2022-05-12 (×2): 50 mg via ORAL
  Filled 2022-05-10 (×3): qty 1

## 2022-05-10 MED ORDER — STROKE: EARLY STAGES OF RECOVERY BOOK: Freq: Once | Status: AC

## 2022-05-10 MED ORDER — AMANTADINE HCL 100 MG PO CAPS
100.0000 mg | ORAL_CAPSULE | Freq: Every day | ORAL | Status: DC
  Administered 2022-05-11 – 2022-05-12 (×2): 100 mg via ORAL
  Filled 2022-05-10 (×2): qty 1

## 2022-05-10 MED ORDER — DOCUSATE SODIUM 100 MG PO CAPS
100.0000 mg | ORAL_CAPSULE | ORAL | Status: DC | PRN
  Administered 2022-05-12: 100 mg via ORAL
  Filled 2022-05-10: qty 1

## 2022-05-10 MED ORDER — APIXABAN 5 MG PO TABS: 5.0000 mg | ORAL_TABLET | Freq: Two times a day (BID) | ORAL | Status: DC

## 2022-05-10 MED ORDER — HEPARIN SODIUM (PORCINE) 5000 UNIT/ML IJ SOLN
5000.0000 [IU] | Freq: Three times a day (TID) | INTRAMUSCULAR | Status: AC
  Administered 2022-05-10 – 2022-05-11 (×2): 5000 [IU] via SUBCUTANEOUS
  Filled 2022-05-10 (×2): qty 1

## 2022-05-10 MED ORDER — FINASTERIDE 5 MG PO TABS
5.0000 mg | ORAL_TABLET | Freq: Every day | ORAL | Status: DC
  Administered 2022-05-11 – 2022-05-12 (×2): 5 mg via ORAL
  Filled 2022-05-10 (×2): qty 1

## 2022-05-10 MED ORDER — PANTOPRAZOLE SODIUM 40 MG PO TBEC
40.0000 mg | DELAYED_RELEASE_TABLET | Freq: Every day | ORAL | Status: DC
  Administered 2022-05-11 – 2022-05-12 (×2): 40 mg via ORAL
  Filled 2022-05-10 (×3): qty 1

## 2022-05-10 NOTE — Code Documentation (Signed)
Stroke Response Nurse Documentation Code Documentation  Justin Hendrix is a 88 y.o. male arriving to [facility] via [facility] on 05/10/2022 with past medical hx of afib, pacemaker, CKD, and recent TIA. On Eliquis (apixaban) daily. Code stroke was activated by EMS.   Patient from home where he was LKW at 1530 and now complaining of slurred speech. Son noticed patient to have acute onset slurred speech and called EMS.   Stroke team at the bedside on patient arrival. Patient to CT with team. NIHSS 11, see documentation for details and code stroke times. Patient with decreased LOC, disoriented, left hemianopia, left facial droop, right leg weakness, dysarthria , and Visual  neglect on exam. The following imaging was completed:  CT Head and CTA. Patient is not a candidate for IV Thrombolytic due to last dose of Eliquis taken this morning, per MD. Patient is not a candidate for IR due to no LVO seen on imaging, per MD.   Care Plan: Q2 NIHSS + VS.   Bedside handoff with ED RN Erlene Quan.    Charise Carwin  Stroke Response RN

## 2022-05-10 NOTE — Assessment & Plan Note (Signed)
PPI ?

## 2022-05-10 NOTE — H&P (Addendum)
History and Physical   PHILLIPPE ORLICK SFK:[id number] DOB: [birth year] DOA: 05/10/2022  PCP: Albina Billet, MD  Outpatient Specialists: Dr. Bernardo Heater Patient coming from: Home via EMS  I have personally briefly reviewed patient's old medical records in [place].  Chief Concern: Facial droop  HPI: [name] is a 88 year old male with history of TIA, Parkinson's disease, paroxysmal atrial fibrillation, GERD, depression, CKD stage IIIa, who presents emergency department for chief concerns of right-sided weakness and facial droop.  Initial vitals in the emergency department showed temperature of 98.8, respiration rate of 17, heart rate of 61, blood pressure 131/74, SPO2 100% on room air.  WBC was 8.0, hemoglobin 11.4, platelets of 211.  Serum sodium 136, potassium 4.5, chloride 104, bicarb 24, nonfasting blood glucose 140, BUN of 14, serum creatinine of 1.26, eGFR is 52.  EDP consulted telemetry neurology who recommends a CTA of the head and neck with and without contrast.  Telemetry neurologist does not recommend TNK  ED treatment: None  At bedside patient was able to tell me his first and last name and he knows he is in the hospital.  He was confused as to why he is in the hospital.  He was not able to tell me his age, or the current location.    He does not appear to be in acute distress.  He states that his son lives with him.  When asked if his son cares for him, he states "I guess you can call at that ".  I asked him if his son provides food for him and ensures he eats.  He states that his son does put out the food for him.  I asked him if he can use the restroom on his own, and patient states that yes he can use the restroom on his own.  When I ask if the patient feels safe?  He responded "what the hell are you talking about? "  I stated to the patient that I wanted to ensure he is safe.  Patient states, "of course I am.  I do not know what you are talking  about"  Social history: His son lives with him.  ROS: Unable to fully complete as patient is not alert to his age, or current calendar year or reason why he is in the hospital  ED Course: Discussed with emergency medicine provider, patient requiring hospitalization for chief concerns of TIA.  Assessment/Plan  Principal Problem:   TIA (transient ischemic attack) Active Problems:   History of TIA (transient ischemic attack)   Benign prostatic hyperplasia with lower urinary tract symptoms   Parkinson's disease (HCC)   Paroxysmal atrial fibrillation (HCC)   GERD without esophagitis   Chronic kidney disease, stage 3a (HCC)   Elevated alkaline phosphatase level   Assessment and Plan:  * TIA (transient ischemic attack) - Telemetry neurology was consulted, Dr. Erlinda Hong was on call who recommends CTA of the head and neck with and without contrast as patient has a cardiac device and not a candidate for MRI at this time - Per EDP, CTA was negative for an acute stroke - Telemetry neurologist recommends repeat CT scan of head in 24 hours and consideration to be given to increase patient's Eliquis from 2.5 mg p.o. twice daily to 5 mg p.o. twice daily - Neurologist has ordered EEG and that is pending completion - Admit to telemetry medical, observation  History of TIA (transient ischemic attack) - Patient is on Eliquis 2.5 mg -  Pending serum creatinine and EGFR to determine if patient can resume Eliquis at 5 mg p.o. twice daily - Per discharge note on 05/03/2022: Patient was discharged with a recommendation to continue Eliquis at 5 mg p.o. twice daily - Attempted to call son to discuss why patient is on Eliquis 2.5 mg p.o. twice daily, no pick up  Elevated alkaline phosphatase level - This appears to be chronically elevated dating back to a CMP on 01/25/2018 - Recommend continue follow-up with outpatient PCP  Chronic kidney disease, stage 3a ([place]) - Pending CMP to determine if patient's CKD is at  baseline  GERD without esophagitis - PPI  Paroxysmal atrial fibrillation ([place]) - Patient is on Eliquis 2.5 mg BID, and per review of prior serum creatinine, this is incorrect dosing - I am pending a CMP to result in order to determine the correct dosing for patient - Serum creatinine and EGFR has resulted, patient's appropriate dosing is Eliquis 5 mg p.o. twice daily, this has been resumed for 05/11/2022 at 2200  Parkinson's disease ([place]) - Amantadine 100 mg p.o. daily resumed  Benign prostatic hyperplasia with lower urinary tract symptoms - Tamsulosin 0.4 mg daily before breakfast resumed  CODE STATUS - I was not able to confirm CODE STATUS with family as no pickup during my phone call - AM team to confirm with next of kin  Chart reviewed.   DVT prophylaxis: Heparin 5000 subcu q8h until repeat CT head is negative for bleed and eliquis is resumed Code Status: DNR per prior chart review Diet: NPO Family Communication: Attempted to call son at [phone], no pickup and left voicemail Disposition Plan: Pending clinical course Consults called: Telemetry neurology Admission status: Telemetry medical, observation  Past Medical History:  Diagnosis Date   Benign prostatic hypertrophy    Chest pain    a. 04/2014 Echo: EF 50-55%, mild LVH, nl RV fxn, mild to mod AS, mild AI, mild TR; b. 04/2014 MV: nl EF. No ischemia.   Chronic prostatitis    Elevated PSA    Incomplete bladder emptying    Parkinson's disease    Pre-syncope    Presence of permanent cardiac pacemaker    TIA (transient ischemic attack)    Unsteady gait    Urinary frequency    Past Surgical History:  Procedure Laterality Date   INTRAMEDULLARY (IM) NAIL INTERTROCHANTERIC Right 11/04/2017   Procedure: INTRAMEDULLARY (IM) NAIL INTERTROCHANTRIC;  Surgeon: Hiram Gash, MD;  Location: [place];  Service: Orthopedics;  Laterality: Right;   PACEMAKER IMPLANT N/A 11/03/2017   Procedure: PACEMAKER IMPLANT;  Surgeon: Thompson Grayer, MD;  Location: [place] [facility];  Service: Cardiovascular;  Laterality: N/A;   Social History:  reports that he has quit smoking. His smokeless tobacco use includes chew. He reports that he does not drink alcohol and does not use drugs.  No Known Allergies Family History  Problem Relation Age of Onset   Hypertension Father    Family history: Family history reviewed and not pertinent  Prior to Admission medications   Medication Sig Start Date End Date Taking? Authorizing Provider  Amantadine HCl 100 MG tablet Take 100 mg by mouth daily. 02/22/22   [provider]  apixaban (ELIQUIS) 2.5 MG TABS tablet Take 1 tablet (2.5 mg total) by mouth 2 (two) times daily. 11/12/21   Deboraha Sprang, MD  atenolol (TENORMIN) 50 MG tablet Take 1 tablet (50 mg) by mouth once daily    [provider]  budesonide-formoterol (SYMBICORT) 160-4.5 MCG/ACT  inhaler Inhale 2 puffs into the lungs 2 (two) times daily as needed (respiratory difficulty).     [provider]  docusate sodium (COLACE) 100 MG capsule Take 1 capsule (100 mg total) by mouth 2 (two) times daily. Patient taking differently: Take 100 mg by mouth as needed for mild constipation. 11/03/17   Vaughan Basta, MD  finasteride (PROSCAR) 5 MG tablet Take 1 tablet (5 mg total) by mouth daily. 03/10/21   Stoioff, Ronda Fairly, MD  fluticasone (FLONASE) 50 MCG/ACT nasal spray 2 sprays by Each Nare route daily. as needed    [provider]  pantoprazole (PROTONIX) 40 MG tablet Take 40 mg by mouth daily.    [provider]  tamsulosin (FLOMAX) 0.4 MG CAPS capsule TAKE 1 CAPSULE BY MOUTH ONCE DAILY 30 MINUTES AFTER LARGEST MEAL. 03/10/21   Abbie Sons, MD   Physical Exam: Vitals:   05/10/22 1645 05/10/22 1649 05/10/22 1650  BP: 131/74    Pulse: 61    Resp: 17    Temp: 98.8 F (37.1 C)    TempSrc: Oral    SpO2: 100% 100%   Weight:   81.6 kg   Constitutional: appears age-appropriate, frail Eyes:  PERRL, lids and conjunctivae normal ENMT: Mucous membranes are moist. Posterior pharynx clear of any exudate or lesions. Age-appropriate dentition.  Mild bilateral hearing loss Neck: normal, supple, no masses, no thyromegaly Respiratory: clear to auscultation bilaterally, no wheezing, no crackles. Normal respiratory effort. No accessory muscle use.  Cardiovascular: Regular rate and rhythm, no murmurs / rubs / gallops. No extremity edema. 2+ pedal pulses. No carotid bruits.  Abdomen: no tenderness, no masses palpated, no hepatosplenomegaly. Bowel sounds positive.  Musculoskeletal: no clubbing / cyanosis. No joint deformity upper and lower extremities. Good ROM, no contractures, no atrophy. Normal muscle tone.  Skin: Multiple age spots.  Small wound on the right lower extremity approximately 1 to 1/2 inch in diameter and does not appear to be infected and appears old.  Multiple skin ecchymosis consistent with geriatric age Neurologic: Sensation intact. Strength 5/5 in all 4.  Psychiatric: Normal judgment and insight. Alert and oriented x 3. Normal mood.   EKG: Ordered by EDP pending results  Chest x-ray on Admission: I personally reviewed and I agree with radiologist reading as below.  CT ANGIO HEAD NECK W WO CM (CODE STROKE)  Result Date: 05/10/2022 CLINICAL DATA:  Acute neuro deficit rule out stroke EXAM: CT ANGIOGRAPHY HEAD AND NECK TECHNIQUE: Multidetector CT imaging of the head and neck was performed using the standard protocol during bolus administration of intravenous contrast. Multiplanar CT image reconstructions and MIPs were obtained to evaluate the vascular anatomy. Carotid stenosis measurements (when applicable) are obtained utilizing NASCET criteria, using the distal internal carotid diameter as the denominator. RADIATION DOSE REDUCTION: This exam was performed according to the departmental dose-optimization program which includes automated exposure control, adjustment of the mA and/or  kV according to patient size and/or use of iterative reconstruction technique. CONTRAST:  74mL OMNIPAQUE IOHEXOL 350 MG/ML SOLN COMPARISON:  CT head 05/10/2022 FINDINGS: CTA NECK FINDINGS Aortic arch: Aortic arch incompletely imaged. There is atherosclerotic calcification in the visualized arch. The origin of the innominate artery and left common carotid artery not imaged. Left subclavian artery patent. Right carotid system: Right carotid artery widely patent without stenosis. Left carotid system: Left carotid widely patent without stenosis. Vertebral arteries: Both vertebral arteries patent to the skull base without stenosis. Left vertebral artery dominant. Skeleton: Cervical spondylosis. No acute skeletal  abnormality. Chronic appearing superior endplate fracture of T3. Other neck: Negative for mass or adenopathy. Upper chest: Left-sided transvenous pacemaker. Visualized lung apices clear bilaterally. Review of the MIP images confirms the above findings CTA HEAD FINDINGS Anterior circulation: Atherosclerotic calcification in the cavernous carotid bilaterally without stenosis. No large vessel occlusion in the anterior or middle cerebral arteries. There is atherosclerotic irregularity and stenosis in the M1 and M2 branches bilaterally. Moderate atherosclerotic disease in the anterior cerebral arteries bilaterally Posterior circulation: Left vertebral artery widely patent and dominant. Mild atherosclerotic disease left vertebral artery at the skull base. Small right vertebral artery with faint opacification distally but no significant stenosis. Right vertebral artery extends to the basilar. Basilar patent. Mild atherosclerotic disease in the basilar. Posterior cerebral arteries patent without occlusion. There is atherosclerotic irregularity in the posterior cerebral arteries bilaterally. Venous sinuses: Limited venous enhancement due to arterial phase scanning. Anatomic variants: None Review of the MIP images confirms  the above findings IMPRESSION: 1. No significant carotid or vertebral artery stenosis in the neck. 2. No intracranial large vessel occlusion. 3. Intracranial atherosclerotic disease throughout the anterior, middle, and posterior cerebral arteries bilaterally. Electronically Signed   By: Marlan Palau M.D.   On: 05/10/2022 17:23   CT HEAD CODE STROKE WO CONTRAST  Result Date: 05/10/2022 CLINICAL DATA:  Code stroke. Neuro deficit, acute, stroke suspected CT head EXAM: CT HEAD WITHOUT CONTRAST TECHNIQUE: Contiguous axial images were obtained from the base of the skull through the vertex without intravenous contrast. RADIATION DOSE REDUCTION: This exam was performed according to the departmental dose-optimization program which includes automated exposure control, adjustment of the mA and/or kV according to patient size and/or use of iterative reconstruction technique. COMPARISON:  May 03, 2022. FINDINGS: Brain: No evidence of acute large vascular territory infarction, hemorrhage, hydrocephalus, extra-axial collection or mass lesion/mass effect. Small remote lacunar infarcts in the basal ganglia and thalami. Additional patchy white matter hypoattenuation, nonspecific but compatible with chronic microvascular ischemic disease. Cerebral atrophy Vascular: Calcific atherosclerosis.  No hyperdense vessel identified Skull: No acute fracture. Sinuses/Orbits: Frothy secretions in the left frontal sinus. Other: Mastoid effusions. ASPECTS [facility] Stroke Program Early CT Score) total score (0-10 with 10 being normal): 10. Code stroke imaging results were communicated on 05/10/2022 at 4:23 pm to provider Modesto Charon via telephone, who verbally acknowledged these results. IMPRESSION: 1. Evidence of acute intracranial. ASPECTS is 10. 2. Remote lacunar infarcts, chronic microvascular ischemic disease, and cerebral atrophy (ICD10-G31.9). Electronically Signed   By: Feliberto Harts M.D.   On: 05/10/2022 16:23    Labs on  Admission: I have personally reviewed following labs CBC: Recent Labs  Lab 05/10/22 1702  WBC 8.0  NEUTROABS 5.7  HGB 11.9*  HCT 36.7*  MCV 90.4  PLT 211   Basic Metabolic Panel: Recent Labs  Lab 05/10/22 1702  NA 136  K 4.5  CL 104  CO2 24  GLUCOSE 140*  BUN 14  CREATININE 1.26*  CALCIUM 7.8*    GFR: Estimated Creatinine Clearance: 36.8 mL/min (A) (by C-G formula based on SCr of 1.26 mg/dL (H)).  Coagulation Profile: Recent Labs  Lab 05/10/22 1702  INR 2.0*   CBG: Recent Labs  Lab 05/10/22 1609  GLUCAP 91   Urine analysis:    Component Value Date/Time   COLORURINE YELLOW (A) 10/15/2021 1715   APPEARANCEUR HAZY (A) 10/15/2021 1715   APPEARANCEUR Clear 02/08/2018 1103   LABSPEC 1.019 10/15/2021 1715   PHURINE 5.0 10/15/2021 1715   GLUCOSEU NEGATIVE 10/15/2021 1715  HGBUR NEGATIVE 10/15/2021 [place] 10/15/2021 1715   BILIRUBINUR Negative 02/08/2018 [place] 10/15/2021 1715   PROTEINUR NEGATIVE 10/15/2021 1715   NITRITE NEGATIVE 10/15/2021 [place] 10/15/2021 1715   Dr. Tobie Poet [facility]  If 7PM-7AM, please contact overnight-coverage provider If 7AM-7PM, please contact day coverage provider [email or web address]  05/10/2022, 7:25 PM

## 2022-05-10 NOTE — Assessment & Plan Note (Signed)
-   Pending CMP to determine if patient's CKD is at baseline

## 2022-05-10 NOTE — ED Triage Notes (Signed)
Pt arrived via EMS from home with right sided weakness and facial drop. Pt was also not talking. When pt arrived in ER pt s/s had semi resolved. Pt is tracking staff around room and talking.

## 2022-05-10 NOTE — Assessment & Plan Note (Signed)
-   Amantadine 100 mg p.o. daily resumed

## 2022-05-10 NOTE — Assessment & Plan Note (Signed)
-   This appears to be chronically elevated dating back to a CMP on 01/25/2018 - Recommend continue follow-up with outpatient PCP

## 2022-05-10 NOTE — Assessment & Plan Note (Signed)
-  Patient is on Eliquis 2.5 mg - Pending serum creatinine and EGFR to determine if patient can resume Eliquis at 5 mg p.o. twice daily - Per discharge note on 05/03/2022: Patient was discharged with a recommendation to continue Eliquis at 5 mg p.o. twice daily - Attempted to call son to discuss why patient is on Eliquis 2.5 mg p.o. twice daily, no pick up

## 2022-05-10 NOTE — Assessment & Plan Note (Addendum)
-   Telemetry neurology was consulted, Dr. Erlinda Hong was on call who recommends CTA of the head and neck with and without contrast as patient has a cardiac device and not a candidate for MRI at this time - Per EDP, CTA was negative for an acute stroke - Telemetry neurologist recommends repeat CT scan of head in 24 hours and consideration to be given to increase patient's Eliquis from 2.5 mg p.o. twice daily to 5 mg p.o. twice daily - Neurologist has ordered EEG and that is pending completion - Admit to telemetry medical, observation

## 2022-05-10 NOTE — Progress Notes (Signed)
Code stroke cart activated at 1610. Dr. Erlinda Hong also communicated with tele-nurse team regarding patient's arrival. Patient already in CT when code stroke was activated. LKW 1530. Dr. Erlinda Hong on cart with patient in CT. Patient returned back from CT at 1638. Stroke response RN also at bedside. Dr. Erlinda Hong on cart for exam at 1640.   Berenice Bouton MSN, RN Tele-stroke RN

## 2022-05-10 NOTE — ED Notes (Signed)
CODE STROKE- PHARMACY COMMUNICATION  Time CODE STROKE called/page received: 1601  Time response to CODE STROKE was made: immediately  Time Stroke Kit retrieved from [place] (only if needed): No TNK per neurologist  Name of Provider/Nurse contacted: Rosalin Hawking, MD  Past Medical History:  Diagnosis Date   Benign prostatic hypertrophy    Chest pain    a. 04/2014 Echo: EF 50-55%, mild LVH, nl RV fxn, mild to mod AS, mild AI, mild TR; b. 04/2014 MV: nl EF. No ischemia.   Chronic prostatitis    Elevated PSA    Incomplete bladder emptying    Parkinson's disease    Pre-syncope    Presence of permanent cardiac pacemaker    TIA (transient ischemic attack)    Unsteady gait    Urinary frequency    Prior to Admission medications   Medication Sig Start Date End Date Taking? Authorizing Provider  Amantadine HCl 100 MG tablet Take 100 mg by mouth daily. 02/22/22   [provider]  apixaban (ELIQUIS) 2.5 MG TABS tablet Take 1 tablet (2.5 mg total) by mouth 2 (two) times daily. 11/12/21   Deboraha Sprang, MD  atenolol (TENORMIN) 50 MG tablet Take 1 tablet (50 mg) by mouth once daily    [provider]  budesonide-formoterol (SYMBICORT) 160-4.5 MCG/ACT inhaler Inhale 2 puffs into the lungs 2 (two) times daily as needed (respiratory difficulty).     [provider]  docusate sodium (COLACE) 100 MG capsule Take 1 capsule (100 mg total) by mouth 2 (two) times daily. Patient taking differently: Take 100 mg by mouth as needed for mild constipation. 11/03/17   Vaughan Basta, MD  finasteride (PROSCAR) 5 MG tablet Take 1 tablet (5 mg total) by mouth daily. 03/10/21   Stoioff, Ronda Fairly, MD  fluticasone (FLONASE) 50 MCG/ACT nasal spray 2 sprays by Each Nare route daily. as needed    [provider]  pantoprazole (PROTONIX) 40 MG tablet Take 40 mg by mouth daily.    [provider]  tamsulosin (FLOMAX) 0.4 MG CAPS capsule TAKE 1 CAPSULE BY MOUTH ONCE DAILY 30  MINUTES AFTER LARGEST MEAL. 03/10/21   Abbie Sons, MD    Dara Hoyer, PharmD PGY-1 Pharmacy Resident 05/10/2022 4:21 PM

## 2022-05-10 NOTE — ED Notes (Signed)
Pt came to ED smelling of feces. RN asked pt who he lives with and pt sts " well you can say that I live by myself. I mean my two children live in my home with me but they dont take care of me. I havent seen them in awhile." RN notified provider of findings.

## 2022-05-10 NOTE — Assessment & Plan Note (Addendum)
-  Patient is on Eliquis 2.5 mg BID, and per review of prior serum creatinine, this is incorrect dosing - I am pending a CMP to result in order to determine the correct dosing for patient - Serum creatinine and EGFR has resulted, patient's appropriate dosing is Eliquis 5 mg p.o. twice daily, this has been resumed for 05/11/2022 at 2200

## 2022-05-10 NOTE — Progress Notes (Signed)
Responded to code Stroke, patient alone in room, no family presence. Please page chaplain if any support needed.

## 2022-05-10 NOTE — Progress Notes (Addendum)
Patient had a CMP ordered at 1700.  Admission order in place at 1733.  At the time of this dictation and signing (1907), CMP is still stating in progress.  This provider called lab and was told that the specimen was received at 1706 BUN is still pending and therefore the CMP is still pending results.

## 2022-05-10 NOTE — Assessment & Plan Note (Signed)
-   Tamsulosin 0.4 mg daily before breakfast resumed

## 2022-05-10 NOTE — ED Provider Notes (Signed)
[facility] Provider Note    Event Date/Time   First MD Initiated Contact with Patient 05/10/22 1618     (approximate)   History   Code Stroke   HPI  Justin Hendrix is a 88 y.o. male   Past medical history of Parkinson's disease, ?TIA recently admitted for stroke work-up, presents as a stroke code last known normal around 3:30 PM reported by family left arm and facial motor deficits.  Patient is disoriented, unclear baseline.  He went immediately to the CT scanner which showed no acute stroke or bleed.  Patient was assessed by show coordinator at bedside as well as telestroke Dr. Orville Govern appropriate and reassuring, patient is stable.  The plan was no acute interventions, no MRI due to pacer incompatibility, repeat CT scan of the head in 24 hours, patient is on Eliquis at 2.5 mg and can increase to full dose if no other contraindications after follow-up CT scan, no LVO and not a thrombectomy candidate.  Admit to hospitalist.       Physical Exam   Triage Vital Signs: ED Triage Vitals  Enc Vitals Group     BP 05/10/22 1645 131/74     Pulse Rate 05/10/22 1645 61     Resp 05/10/22 1645 17     Temp 05/10/22 1645 98.8 F (37.1 C)     Temp Source 05/10/22 1645 Oral     SpO2 05/10/22 1645 100 %     Weight 05/10/22 1650 180 lb (81.6 kg)     Height --      Head Circumference --      Peak Flow --      Pain Score 05/10/22 1649 0     Pain Loc --      Pain Edu? --      Excl. in [place]? --     Most recent vital signs: Vitals:   05/10/22 1645 05/10/22 1649  BP: 131/74   Pulse: 61   Resp: 17   Temp: 98.8 F (37.1 C)   SpO2: 100% 100%    General: Awake, no distress.  CV:  Good peripheral perfusion.  Resp:  Normal effort.  Abd:  No distention.  Other:  Disoriented, mild left facial droop.   ED Results / Procedures / Treatments   Labs (all labs ordered are listed, but only abnormal results are displayed) Labs Reviewed  PROTIME-INR  - Abnormal; Notable for the following components:      Result Value   Prothrombin Time 22.6 (*)    INR 2.0 (*)    All other components within normal limits  CBC - Abnormal; Notable for the following components:   RBC 4.06 (*)    Hemoglobin 11.9 (*)    HCT 36.7 (*)    RDW 18.2 (*)    All other components within normal limits  COMPREHENSIVE METABOLIC PANEL - Abnormal; Notable for the following components:   Glucose, Bld 140 (*)    Creatinine, Ser 1.26 (*)    Calcium 7.8 (*)    Total Protein 4.9 (*)    Albumin 2.1 (*)    Alkaline Phosphatase 287 (*)    GFR, Estimated 52 (*)    All other components within normal limits  ETHANOL  APTT  DIFFERENTIAL  URINE DRUG SCREEN, QUALITATIVE ([facility] ONLY)  URINALYSIS, ROUTINE W REFLEX MICROSCOPIC  LIPID PANEL  CBC     I reviewed labs and they are notable for glucose 140  ED ECG REPORT I,  Lucillie Garfinkel, the attending physician, personally viewed and interpreted this ECG.   Date: 05/10/2022  EKG Time: 1641  Rate: 82  Rhythm: AV paced rhythm  Intervals:no ischemic changes, no stemi  RADIOLOGY I independently reviewed and interpreted CT of the head and see no obvious bleed or midline shift.   PROCEDURES:  Critical Care performed: Yes, see critical care procedure note(s)  .Critical Care  Performed by: Lucillie Garfinkel, MD Authorized by: Lucillie Garfinkel, MD   Critical care provider statement:    Critical care time (minutes):  30   Critical care was necessary to treat or prevent imminent or life-threatening deterioration of the following conditions:  CNS failure or compromise   Critical care was time spent personally by me on the following activities:  Development of treatment plan with patient or surrogate, discussions with consultants, evaluation of patient's response to treatment, examination of patient, ordering and review of laboratory studies, ordering and review of radiographic studies, ordering and performing treatments and interventions,  pulse oximetry, re-evaluation of patient's condition and review of [facility]: Medications  docusate sodium (COLACE) capsule 100 mg (has no administration in time range)  pantoprazole (PROTONIX) EC tablet 40 mg (has no administration in time range)   stroke: early stages of recovery book (0 each Does not apply Hold 05/10/22 1746)  acetaminophen (TYLENOL) tablet 650 mg (has no administration in time range)    Or  acetaminophen (TYLENOL) 160 MG/5ML solution 650 mg (has no administration in time range)    Or  acetaminophen (TYLENOL) suppository 650 mg (has no administration in time range)  atenolol (TENORMIN) tablet 50 mg (has no administration in time range)  finasteride (PROSCAR) tablet 5 mg (has no administration in time range)  tamsulosin (FLOMAX) capsule 0.4 mg (has no administration in time range)  amantadine (SYMMETREL) capsule 100 mg (has no administration in time range)  mometasone-formoterol (DULERA) 200-5 MCG/ACT inhaler 2 puff (has no administration in time range)  apixaban (ELIQUIS) tablet 5 mg (has no administration in time range)  heparin injection 5,000 Units (has no administration in time range)  iohexol (OMNIPAQUE) 350 MG/ML injection 75 mL (60 mLs Intravenous Contrast Given 05/10/22 1629)    Consultants:  I spoke with neuro Dr Erlinda Hong regarding care plan for this patient.   IMPRESSION / MDM / ASSESSMENT AND PLAN / ED COURSE  I reviewed the triage vital signs and the nursing notes.                              Differential diagnosis includes, but is not limited to, acute stroke, TIA, infection, metabolic derangement   The patient is on the cardiac monitor to evaluate for evidence of arrhythmia and/or significant heart rate changes.  MDM: Code stroke, dispo as above for stroke work-up inpatient no interventions currently.   Patient's presentation is most consistent with acute presentation with potential threat to life or bodily  function.       FINAL CLINICAL IMPRESSION(S) / ED DIAGNOSES   Final diagnoses:  Facial droop     Rx / DC Orders   ED Discharge Orders     None        Note:  This document was prepared using Dragon voice recognition software and may include unintentional dictation errors.    Lucillie Garfinkel, MD 05/10/22 [phone]

## 2022-05-10 NOTE — Hospital Course (Addendum)
Justin Hendrix is a 88 year old male with history of TIA, Parkinson's disease, paroxysmal atrial fibrillation, GERD, depression, CKD stage IIIa, who presents emergency department for chief concerns of right-sided weakness and facial droop.  Initial vitals in the emergency department showed temperature of 98.8, respiration rate of 17, heart rate of 61, blood pressure 131/74, SPO2 100% on room air.  WBC was 8.0, hemoglobin 11.4, platelets of 211.  Serum sodium 136, potassium 4.5, chloride 104, bicarb 24, nonfasting blood glucose 140, BUN of 14, serum creatinine of 1.26, eGFR is 52.  EDP consulted telemetry neurology who recommends a CTA of the head and neck with and without contrast.  Telemetry neurologist does not recommend TNK  ED treatment: None

## 2022-05-10 NOTE — Consult Note (Addendum)
[facility] Telemedicine Consult   Requesting Provider: Dr. Jacelyn Grip  Chief Complaint: left side weakness and left facial droop  HPI:  88 yo M with PMH of CHB s/p pacemaker, Afib on eliquis 2.5mg , PD, CKD IIIa, recent TIA presented to ED for code stroke.  Per RN, pt was at home and son found him to have left sided weakness and left facial droop. Last seen well 3:30pm. I called son's phone number on file but nobody picked up the phone. Voice mail message left for calling back. Per RN report, pt also had urinary incontinence with today's event, which was unusual for him, but no shaking jerking or LOC, tongue biting. No hx of seizure.   Of note, pt was recently admitted on 10/9 for right sided weakness and right facial droop, but resolved after a few hours. EF 45-50%. LDL 78 and A1C 5.6. CUS unremarkable. Continued on eliquis at discharge. He was also admitted 03/2022 for an episode of syncope. He has hx of complete heart block with pacemaker, and afib on eliquis 2.5mg  bid. Per EMS, pt son gives pt medication and he took eliquis this morning. BP on arrival 130/80 and glucose 91. CT no acute finding. CTA head and neck no LVO but intracranial stenosis.    LKW: [id number] tpa given?: No, on eliquis IR Thrombectomy? No, no LVO Modified Rankin Scale: 2-Slight disability-UNABLE to perform all activities but does not need assistance   Exam: Vitals:   05/10/22 1645 05/10/22 1649  BP: 131/74   Pulse: 61   Resp: 17   Temp: 98.8 F (37.1 C)   SpO2: 100% 100%     Temp:  [98.8 F (37.1 C)] 98.8 F (37.1 C) (10/17 1645) Pulse Rate:  [61] 61 (10/17 1645) Resp:  [17] 17 (10/17 1645) BP: (131)/(74) 131/74 (10/17 1645) SpO2:  [100 %] 100 % (10/17 1649) Weight:  [81.6 kg] 81.6 kg (10/17 1650)  General - Well nourished, well developed, in no apparent distress.  Ophthalmologic - fundi not visualized due to noncooperation.  Cardiovascular - irregular rhythm and rate.  Neuro - slight drowsy  but easily arousable, not orientated to time or age. No aphasia, paucity of speech, moderate to severe dysarthria, able to repeat simple sentence but not cooperative with naming, able to follow all simple commands. No gaze palsy but not tracking bilaterally as requested, visual field testing difficulty but seems not blinking to visual threat on the left. Mild left facial droop. Tongue protrusion not cooperative. Bilateral UEs 4/5, no drift. RLE drift but not hit bed in 5 sec, LLE no drift. Sensation symmetrical bilaterally, no sensory neglect, b/l FTN intact grossly, gait not tested. Marland Kitchen   NIH Stroke Scale  Level Of Consciousness 0=Alert; keenly responsive 1=Arouse to minor stimulation 2=Requires repeated stimulation to arouse or movements to pain 3=postures or unresponsive 1  LOC Questions to Month and Age 77=Answers both questions correctly 1=Answers one question correctly or dysarthria/intubated/trauma/language barrier 2=Answers neither question correctly or aphasia 2  LOC Commands      -Open/Close eyes     -Open/close grip     -Pantomime commands if communication barrier 0=Performs both tasks correctly 1=Performs one task correctly 2=Performs neighter task correctly 0  Best Gaze     -Only assess horizontal gaze 0=Normal 1=Partial gaze palsy 2=Forced deviation, or total gaze paresis 0  Visual 0=No visual loss 1=Partial hemianopia 2=Complete hemianopia 3=Bilateral hemianopia (blind including cortical blindness) 2  Facial Palsy     -Use grimace if obtunded 0=Normal symmetrical movement  1=Minor paralysis (asymmetry) 2=Partial paralysis (lower face) 3=Complete paralysis (upper and lower face) 1  Motor  0=No drift for 10/5 seconds 1=Drift, but does not hit bed 2=Some antigravity effort, hits  bed 3=No effort against gravity, limb falls 4=No movement 0=Amputation/joint fusion Right Arm 0     Leg 1    Left Arm 0     Leg 0  Limb Ataxia     - FNT/HTS 0=Absent or does not understand  or paralyzed or amputation/joint fusion 1=Present in one limb 2=Present in two limbs 0  Sensory 0=Normal 1=Mild to moderate sensory loss 2=Severe to total sensory loss or coma/unresponsive 0  Best Language 0=No aphasia, normal 1=Mild to moderate aphasia 2=Severe aphasia 3=Mute, global aphasia, or coma/unresponsive 1  Dysarthria 0=Normal 1=Mild to moderate 2=Severe, unintelligible or mute/anarthric 0=intubated/unable to test 1  Extinction/Neglect 0=No abnormality 1=visual/tactile/auditory/spatia/personal inattention/Extinction to bilateral simultaneous stimulation 2=Profound neglect/extinction more than 1 modality  1  Total   10      Imaging Reviewed:  CT HEAD CODE STROKE WO CONTRAST  Result Date: 05/10/2022 CLINICAL DATA:  Code stroke. Neuro deficit, acute, stroke suspected CT head EXAM: CT HEAD WITHOUT CONTRAST TECHNIQUE: Contiguous axial images were obtained from the base of the skull through the vertex without intravenous contrast. RADIATION DOSE REDUCTION: This exam was performed according to the departmental dose-optimization program which includes automated exposure control, adjustment of the mA and/or kV according to patient size and/or use of iterative reconstruction technique. COMPARISON:  May 03, 2022. FINDINGS: Brain: No evidence of acute large vascular territory infarction, hemorrhage, hydrocephalus, extra-axial collection or mass lesion/mass effect. Small remote lacunar infarcts in the basal ganglia and thalami. Additional patchy white matter hypoattenuation, nonspecific but compatible with chronic microvascular ischemic disease. Cerebral atrophy Vascular: Calcific atherosclerosis.  No hyperdense vessel identified Skull: No acute fracture. Sinuses/Orbits: Frothy secretions in the left frontal sinus. Other: Mastoid effusions. ASPECTS [facility] Stroke Program Early CT Score) total score (0-10 with 10 being normal): 10. Code stroke imaging results were communicated on  05/10/2022 at 4:23 pm to provider Jacelyn Grip via telephone, who verbally acknowledged these results. IMPRESSION: 1. Evidence of acute intracranial. ASPECTS is 10. 2. Remote lacunar infarcts, chronic microvascular ischemic disease, and cerebral atrophy (ICD10-G31.9). Electronically Signed   By: Margaretha Sheffield M.D.   On: 05/10/2022 16:23     Labs reviewed in epic and pertinent values follow: Platelet 211, Cre 1.34  Assessment:  88 yo M with PMH of CHB s/p pacemaker, Afib on eliquis 2.5mg , PD, CKD IIIa, recent TIA presented to ED for possible left sided weakness and left facial droop. Last seen well 3:30pm. Hx of complete heart block with pacemaker, and afib on eliquis 2.5mg  bid. NIHSS = 10. CT no acute finding. CTA head and neck no LVO but intracranial stenosis. Pt not TNK candidate due to on eliquis. Not IR candidate due to no LVO. Pt has pacemaker which may not compatible with MRI. Probably needs CT repeat in 24 hours. If no bleeding or large infarct, can resume eliquis but may need pharmacy dosing to avoid subtherapeutic dose. Given urinary incontinence, will check EEG.    Recommendations:  Recommend hospitalist service admission and continue further stroke work up  Frequent neuro checks Telemetry monitoring MRI brain if pacemaker compatible, otherwise CT repeat in 24 hours Routine EEG PT/OT/speech consult Permissive hypertension (only treat if BP > 180/105 given on eliquis) for 24-48 hours post stroke onset GI and DVT prophylaxis  Pt had eliquis today, hold off AC for now. If  CT repeat in large infarct or bleeding, can resume eliquis with pharmacy dosing.  Stroke risk factor modification Discussed with Dr. Jacelyn Grip ED physician We will follow    Consult Participants: RN, stroke response RN, pt and me Location of the provider: [facility] Location of the patient: [facility]  Time Code Stroke Page received:  4:00pm Time neurologist arrived:  4:08pm Time NIHSS completed: 4:22pm    This consult was  provided via telemedicine with 2-way video and audio communication. The patient/family was informed that care would be provided in this way and agreed to receive care in this manner.   This patient is receiving care for possible acute neurological changes. There was 60 minutes of care by this provider at the time of service, including time for direct evaluation via telemedicine, review of medical records, imaging studies and discussion of findings with providers, the patient and/or family.  Rosalin Hawking, MD PhD Stroke Neurology 05/10/2022 5:11 PM

## 2022-05-10 NOTE — ED Notes (Signed)
Pt in CT at this time. Neuro cart already in room and button was pressed by [employer]. All known info provided.

## 2022-05-11 ENCOUNTER — Observation Stay: Payer: Medicare PPO

## 2022-05-11 ENCOUNTER — Encounter: Payer: Self-pay | Admitting: Internal Medicine

## 2022-05-11 ENCOUNTER — Other Ambulatory Visit: Payer: Self-pay

## 2022-05-11 DIAGNOSIS — R4182 Altered mental status, unspecified: Secondary | ICD-10-CM | POA: Diagnosis not present

## 2022-05-11 DIAGNOSIS — G459 Transient cerebral ischemic attack, unspecified: Secondary | ICD-10-CM | POA: Diagnosis not present

## 2022-05-11 DIAGNOSIS — I480 Paroxysmal atrial fibrillation: Secondary | ICD-10-CM | POA: Diagnosis not present

## 2022-05-11 LAB — CBC
HCT: 37.4 % — ABNORMAL LOW (ref 39.0–52.0)
Hemoglobin: 12.3 g/dL — ABNORMAL LOW (ref 13.0–17.0)
MCH: 28.9 pg (ref 26.0–34.0)
MCHC: 32.9 g/dL (ref 30.0–36.0)
MCV: 88 fL (ref 80.0–100.0)
Platelets: 221 10*3/uL (ref 150–400)
RBC: 4.25 MIL/uL (ref 4.22–5.81)
RDW: 18.1 % — ABNORMAL HIGH (ref 11.5–15.5)
WBC: 8.2 10*3/uL (ref 4.0–10.5)
nRBC: 0 % (ref 0.0–0.2)

## 2022-05-11 LAB — GAMMA GT: GGT: 13 U/L (ref 7–50)

## 2022-05-11 LAB — LIPID PANEL
Cholesterol: 120 mg/dL (ref 0–200)
HDL: 42 mg/dL (ref >40)
LDL Cholesterol: 67 mg/dL (ref 0–99)
Total CHOL/HDL Ratio: 2.9 RATIO
Triglycerides: 56 mg/dL (ref <150)
VLDL: 11 mg/dL (ref 0–40)

## 2022-05-11 LAB — TSH: TSH: 1.189 u[IU]/mL (ref 0.350–4.500)

## 2022-05-11 LAB — MAGNESIUM: Magnesium: 1.8 mg/dL (ref 1.7–2.4)

## 2022-05-11 MED ORDER — APIXABAN 5 MG PO TABS
5.0000 mg | ORAL_TABLET | Freq: Two times a day (BID) | ORAL | Status: DC
  Administered 2022-05-11 – 2022-05-12 (×2): 5 mg via ORAL
  Filled 2022-05-11 (×2): qty 1

## 2022-05-11 MED ORDER — SODIUM CHLORIDE 0.9 % IV SOLN: INTRAVENOUS | Status: DC

## 2022-05-11 MED ORDER — ORAL CARE MOUTH RINSE: 15.0000 mL | OROMUCOSAL | Status: DC | PRN

## 2022-05-11 NOTE — Evaluation (Signed)
Clinical/Bedside Swallow Evaluation Patient Details  Name: Justin Hendrix MRN: [id number] Date of Birth: July 07, 1923  Today's Date: 05/11/2022 Time: SLP Start Time (ACUTE ONLY): 0915 SLP Stop Time (ACUTE ONLY): 1015 SLP Time Calculation (min) (ACUTE ONLY): 60 min  Past Medical History:  Past Medical History:  Diagnosis Date   Benign prostatic hypertrophy    Chest pain    a. 04/2014 Echo: EF 50-55%, mild LVH, nl RV fxn, mild to mod AS, mild AI, mild TR; b. 04/2014 MV: nl EF. No ischemia.   Chronic prostatitis    Elevated PSA    Incomplete bladder emptying    Parkinson's disease    Pre-syncope    Presence of permanent cardiac pacemaker    TIA (transient ischemic attack)    Unsteady gait    Urinary frequency    Past Surgical History:  Past Surgical History:  Procedure Laterality Date   INTRAMEDULLARY (IM) NAIL INTERTROCHANTERIC Right 11/04/2017   Procedure: INTRAMEDULLARY (IM) NAIL INTERTROCHANTRIC;  Surgeon: Bjorn Pippin, MD;  Location: [facility] OR;  Service: Orthopedics;  Laterality: Right;   PACEMAKER IMPLANT N/A 11/03/2017   Procedure: PACEMAKER IMPLANT;  Surgeon: Hillis Range, MD;  Location: [facility] INVASIVE CV LAB;  Service: Cardiovascular;  Laterality: N/A;   HPI:  Pt 88 year old with past history of complete heart block status post pacemaker, A-fib on Eliquis 2.5 mg dosage, Parkinson's Dis w/ Cognitive decline, CKD 3A, recent TIA presented to the ED as a code stroke for left-sided weakness and facial droop with last known well at 3:30 PM.  Not a candidate for IV thrombolysis due to being on Eliquis.  Admitted for repeat head CT in 24 hours to evaluate for any evolving infarct versus clinical presentation secondary to a right splenic TIA.  Had a recent admission for TIA with right-sided weakness that had resolved.  Pt has a Baseline of Pt has a Baseline of Parkinson disease and Cognitive decline per chart notes at previous admits; noted multiple admits in recent months.   Head CT: Remote  lacunar infarcts, chronic microvascular ischemic disease,  and cerebral atrophy -- see Neurology note.    Assessment / Plan / Recommendation  Clinical Impression   Pt seen for BSE this date. Pt awake, verbal and engaged easily w/ this Clinician. Baseline Cognitive decline per chart history/notes, but quite functional language abilities this morning to make his wants/needs known this date. Neurologist present, agreed. Pt was oriented to self and place; followed basic 1-step commands and was able to feed himself w/ setup support.  On RA; Afebrile; WBC WNL.  Pt is primarily Edentulous.   Pt appears to present w/ grossly adequate oropharyngeal phase swallow function w/ No overt oropharyngeal phase dysphagia noted, No neuromuscular deficits noted. Pt consumed po trials w/ No overt, clinical s/s of aspiration during po trials. Pt appears at reduced risk for aspiration following general aspiration precautions and when given tray setup/food prep and support at meals -- encouragement to eat/drink.   However, pt does have challenging factors that could impact oropharyngeal swallowing to include Baseline Cognitive decline and parkinson's dis, deconditioned/weakness, and advanced age. These factors can increase risk for aspiration, dysphagia as well as decreased oral intake overall. Recommend Dietician support.   During po trials, pt consumed all consistencies w/ no overt coughing, decline in vocal quality, or change in respiratory presentation during/post trials. O2 sats remained 97%. Oral phase appeared grossly [facility] w/ timely bolus management, mashing/gumming of softened solids, and control of bolus propulsion for A-P transfer for swallowing.  Oral clearing achieved w/ all trial consistencies -- moistened, chopped/soft foods given.  OM Exam appeared [facility] w/ No unilateral weakness noted. Speech Clear. Pt fed self w/ setup support and positioning support.   Recommend a more Mech Soft consistency diet w/  well-Cut/Chopped meats, moistened foods; Thin liquids -- Cup drinking(carefully monitor straw use and cease use of coughing noted). Recommend general aspiration precautions, reducing distractions at meals, and tray setup/support at meals. Pills WHOLE in Puree for safer, easier swallowing -- pt has done this in the past.  Education given on Pills in Puree for now and at D/C to pt/NSG; food consistencies and easy to eat options; general aspiration precautions. MD/NSG to reconsult if any new needs arise. NSG updated, agreed. MD updated. Recommend Dietician f/u for support.  OF NOTE: Re: pt's cognitive-linguistic abilities, he appears at his Baseline when reviewing chart notes and per NSG report -- he is able to adequately communicate his wants/needs; follow 1-step commands. Speech is clear/intelligible(edentulous). Pt should have Supervision at Home at D/C d/t Baseline Cognitive decline and Parkinson's Dis.  Recommend pt return home to known setting and post Rest and lessening of acuity of illness, IF Family determine that pt is having Cognitive-linguistic decline from his norm, then to f/u w/ pt's PCP for referral to skilled ST services then to address any needs. It is difficult to determine if pt is different from his Baseline at this time, especially in Acute setting and w/out Rest and Family.  Neurologist updated and agreed. SLP Visit Diagnosis: Dysphagia, unspecified (R13.10) (lacks Dentition for effective mastication of solids; Cognitive decline at [facility] per charts)    Aspiration Risk  Mild aspiration risk;Risk for inadequate nutrition/hydration (reduced when following general aspiration precautions)    Diet Recommendation     Medication Administration: Whole meds with puree (vs need to Crush in puree)    Other  Recommendations Recommended Consults:  (Dietician f/u) Oral Care Recommendations: Oral care BID;Oral care before and after PO;Staff/trained caregiver to provide oral care (setup,  support) Other Recommendations:  (n/a)    Recommendations for follow up therapy are one component of a multi-disciplinary discharge planning process, led by the attending physician.  Recommendations may be updated based on patient status, additional functional criteria and insurance authorization.  Follow up Recommendations No SLP follow up      Assistance Recommended at Discharge Intermittent Supervision/Assistance (setup and support at meals)  Functional Status Assessment Patient has had a recent decline in their functional status and demonstrates the ability to make significant improvements in function in a reasonable and predictable amount of time.  Frequency and Duration  (n/a)   (n/a)       Prognosis Prognosis for Safe Diet Advancement: Good Barriers to Reach Goals: Cognitive deficits;Time post onset;Severity of deficits;Motivation Barriers/Prognosis Comment: pt has Baseline Cognitive decline      Swallow Study   General Date of Onset: 05/10/22 HPI: Pt 88 year old with past history of complete heart block status post pacemaker, A-fib on Eliquis 2.5 mg dosage, Parkinson's Dis w/ Cognitive decline, CKD 3A, recent TIA presented to the ED as a code stroke for left-sided weakness and facial droop with last known well at 3:30 PM.  Not a candidate for IV thrombolysis due to being on Eliquis.  Admitted for repeat head CT in 24 hours to evaluate for any evolving infarct versus clinical presentation secondary to a right splenic TIA.  Had a recent admission for TIA with right-sided weakness that had resolved.  Pt has a Baseline of  Pt has a Baseline of Parkinson disease and Cognitive decline per chart notes at previous admits; noted multiple admits in recent months.   Head CT: Remote lacunar infarcts, chronic microvascular ischemic disease,  and cerebral atrophy -- see Neurology note. Type of Study: Bedside Swallow Evaluation Previous Swallow Assessment: none Diet Prior to this Study:  NPO Temperature Spikes Noted: No (wbc 8.2) Respiratory Status: Room air History of Recent Intubation: No Behavior/Cognition: Alert;Cooperative;Pleasant mood;Confused;Distractible;Requires cueing (adequately redirected to tasks, conversation) Oral Cavity Assessment: Within Functional Limits Oral Care Completed by SLP: Yes Oral Cavity - Dentition: Edentulous (1-2 teeth) Vision: Functional for self-feeding Self-Feeding Abilities: Able to feed self;Needs assist;Needs set up Patient Positioning: Upright in bed (needed positioning assistance) Baseline Vocal Quality: Normal Volitional Cough: Strong Volitional Swallow: Able to elicit    Oral/Motor/Sensory Function Overall Oral Motor/Sensory Function: Within functional limits (no gross unilateral weakness; NO lingual weakness)   Ice Chips Ice chips: Within functional limits Presentation: Spoon (fed; 2 trials)   Thin Liquid Thin Liquid: Within functional limits Presentation: Cup;Self Fed (10+ trials)    Nectar Thick Nectar Thick Liquid: Not tested   Honey Thick Honey Thick Liquid: Not tested   Puree Puree: Within functional limits Presentation: Self Fed;Spoon (~4 ozs total)   Solid     Solid: Impaired (lacks Dentition) Presentation: Self Fed;Spoon (8 trials) Oral Phase Impairments: Impaired mastication (dentition) Pharyngeal Phase Impairments:  (none) Other Comments: time and moistening foods helped        Orinda Kenner, MS, CCC-SLP Speech Language Pathologist Rehab Services; [place] [phone] (ascom) Justin Hendrix 05/11/2022,12:07 PM

## 2022-05-11 NOTE — Progress Notes (Signed)
Eeg done 

## 2022-05-11 NOTE — Plan of Care (Signed)
  Problem: Education: Goal: Knowledge of General Education information will improve Description: Including pain rating scale, medication(s)/side effects and non-pharmacologic comfort measures Outcome: Progressing   Problem: Health Behavior/Discharge Planning: Goal: Ability to manage health-related needs will improve Outcome: Progressing   Problem: Education: Goal: Knowledge of secondary prevention will improve (SELECT ALL) Outcome: Progressing Goal: Knowledge of patient specific risk factors will improve (INDIVIDUALIZE FOR PATIENT) Outcome: Progressing   Problem: Nutrition: Goal: Risk of aspiration will decrease Outcome: Progressing

## 2022-05-11 NOTE — Progress Notes (Addendum)
PROGRESS NOTE    JAIVYN GULLA  BHA:[id number] DOB: 10-29-1922 DOA: 05/10/2022 PCP: Albina Billet, MD   Brief Narrative: 88 year old with past medical history significant for TIA, Parkinson disease, paroxysmal A-fib, GERD, CKD stage IIIa presents with concern of right-sided weakness and facial droop.  Recent admission for left side weakness.  Patient was evaluated by telemetry neurology Dr. Erlinda Hong who did not recommended tPA because patient was already on Eliquis. Patient's symptoms subsequently has resolved.  Plan is to proceed for evaluation of stroke with repeating CT head today at 4:00, EEG, check orthostatic vitals.   Assessment & Plan:   Principal Problem:   TIA (transient ischemic attack) Active Problems:   History of TIA (transient ischemic attack)   Benign prostatic hyperplasia with lower urinary tract symptoms   Parkinson's disease (HCC)   Paroxysmal atrial fibrillation (HCC)   GERD without esophagitis   Chronic kidney disease, stage 3a (HCC)   Elevated alkaline phosphatase level   1-Right side weakness, facial droop:  TIA Vs rule out seizure, orthostatic hypotension.  Plan to repeat CT head today. Depending on result resume Eliquis pharmacy to dose.  PT, OT eval.  EEG.  Appreciate neurology evaluation.   Transaminase; chronic elevation.  Follow up out patient  Checking GGT  CKD stage IIIa:  Cr stable.   GERD; PPI  Paroxysmal A fib  Pacemaker.  Cardiology will assist with pacemake interrogation. Few SVT, short, no a fib.   FTT; not eating much for probably a moth per son. Plan to check for constipation, TSH. Palliative care consult for goals of care.     Estimated body mass index is 23.02 kg/m as calculated from the following:   Height as of this encounter: 6' (1.829 m).   Weight as of this encounter: 77 kg.   DVT prophylaxis: scd Code Status: DNR, patient wishes t be DNR Family Communication: son didn't answer call.  Disposition Plan:  Status is:  Observation The patient remains OBS appropriate and will d/c before 2 midnights.    Consultants:  Neurology   Procedures:    Antimicrobials:    Subjective: He is alert, denies pain. Moves all 4 extremities.  Facial droop resolved.    Objective: Vitals:   05/11/22 0519 05/11/22 0735 05/11/22 1105 05/11/22 1150  BP: 137/74 (!) 141/68 (!) 137/96 (!) 144/96  Pulse: 68 (!) 59 70 63  Resp: 16 16  16   Temp: (!) 97.3 F (36.3 C) 98 F (36.7 C)  97.7 F (36.5 C)  TempSrc: Oral     SpO2: 95% 92%  96%  Weight:      Height:       No intake or output data in the 24 hours ending 05/11/22 1305 Filed Weights   05/10/22 1650 05/10/22 2146  Weight: 81.6 kg 77 kg    Examination:  General exam: Appears calm and comfortable  Respiratory system: Clear to auscultation. Respiratory effort normal. Cardiovascular system: S1 & S2 heard, RRR. No JVD, murmurs, rubs, gallops or clicks. No pedal edema. Gastrointestinal system: Abdomen is nondistended, soft and nontender. No organomegaly or masses felt. Normal bowel sounds heard. Central nervous system: Alert and oriented. No focal neurological deficits. Extremities: Symmetric 5 x 5 power. Skin: No rashes, lesions or ulcers    Data Reviewed: I have personally reviewed following labs and imaging studies  CBC: Recent Labs  Lab 05/10/22 1702 05/11/22 0505  WBC 8.0 8.2  NEUTROABS 5.7  --   HGB 11.9* 12.3*  HCT 36.7* 37.4*  MCV 90.4 88.0  PLT 211 221   Basic Metabolic Panel: Recent Labs  Lab 05/10/22 1702  NA 136  K 4.5  CL 104  CO2 24  GLUCOSE 140*  BUN 14  CREATININE 1.26*  CALCIUM 7.8*   GFR: Estimated Creatinine Clearance: 36.5 mL/min (A) (by C-G formula based on SCr of 1.26 mg/dL (H)). Liver Function Tests: Recent Labs  Lab 05/10/22 1702  AST 28  ALT 7  ALKPHOS 287*  BILITOT 1.1  PROT 4.9*  ALBUMIN 2.1*   No results for input(s): "LIPASE", "AMYLASE" in the last 168 hours. No results for input(s): "AMMONIA"  in the last 168 hours. Coagulation Profile: Recent Labs  Lab 05/10/22 1702  INR 2.0*   Cardiac Enzymes: No results for input(s): "CKTOTAL", "CKMB", "CKMBINDEX", "TROPONINI" in the last 168 hours. BNP (last 3 results) No results for input(s): "PROBNP" in the last 8760 hours. HbA1C: No results for input(s): "HGBA1C" in the last 72 hours. CBG: Recent Labs  Lab 05/10/22 1609  GLUCAP 91   Lipid Profile: Recent Labs    05/11/22 0505  CHOL 120  HDL 42  LDLCALC 67  TRIG 56  CHOLHDL 2.9   Thyroid Function Tests: No results for input(s): "TSH", "T4TOTAL", "FREET4", "T3FREE", "THYROIDAB" in the last 72 hours. Anemia Panel: No results for input(s): "VITAMINB12", "FOLATE", "FERRITIN", "TIBC", "IRON", "RETICCTPCT" in the last 72 hours. Sepsis Labs: No results for input(s): "PROCALCITON", "LATICACIDVEN" in the last 168 hours.  No results found for this or any previous visit (from the past 240 hour(s)).       Radiology Studies: CT ANGIO HEAD NECK W WO CM (CODE STROKE)  Result Date: 05/10/2022 CLINICAL DATA:  Acute neuro deficit rule out stroke EXAM: CT ANGIOGRAPHY HEAD AND NECK TECHNIQUE: Multidetector CT imaging of the head and neck was performed using the standard protocol during bolus administration of intravenous contrast. Multiplanar CT image reconstructions and MIPs were obtained to evaluate the vascular anatomy. Carotid stenosis measurements (when applicable) are obtained utilizing NASCET criteria, using the distal internal carotid diameter as the denominator. RADIATION DOSE REDUCTION: This exam was performed according to the departmental dose-optimization program which includes automated exposure control, adjustment of the mA and/or kV according to patient size and/or use of iterative reconstruction technique. CONTRAST:  91mL OMNIPAQUE IOHEXOL 350 MG/ML SOLN COMPARISON:  CT head 05/10/2022 FINDINGS: CTA NECK FINDINGS Aortic arch: Aortic arch incompletely imaged. There is  atherosclerotic calcification in the visualized arch. The origin of the innominate artery and left common carotid artery not imaged. Left subclavian artery patent. Right carotid system: Right carotid artery widely patent without stenosis. Left carotid system: Left carotid widely patent without stenosis. Vertebral arteries: Both vertebral arteries patent to the skull base without stenosis. Left vertebral artery dominant. Skeleton: Cervical spondylosis. No acute skeletal abnormality. Chronic appearing superior endplate fracture of T3. Other neck: Negative for mass or adenopathy. Upper chest: Left-sided transvenous pacemaker. Visualized lung apices clear bilaterally. Review of the MIP images confirms the above findings CTA HEAD FINDINGS Anterior circulation: Atherosclerotic calcification in the cavernous carotid bilaterally without stenosis. No large vessel occlusion in the anterior or middle cerebral arteries. There is atherosclerotic irregularity and stenosis in the M1 and M2 branches bilaterally. Moderate atherosclerotic disease in the anterior cerebral arteries bilaterally Posterior circulation: Left vertebral artery widely patent and dominant. Mild atherosclerotic disease left vertebral artery at the skull base. Small right vertebral artery with faint opacification distally but no significant stenosis. Right vertebral artery extends to the basilar. Basilar patent. Mild  atherosclerotic disease in the basilar. Posterior cerebral arteries patent without occlusion. There is atherosclerotic irregularity in the posterior cerebral arteries bilaterally. Venous sinuses: Limited venous enhancement due to arterial phase scanning. Anatomic variants: None Review of the MIP images confirms the above findings IMPRESSION: 1. No significant carotid or vertebral artery stenosis in the neck. 2. No intracranial large vessel occlusion. 3. Intracranial atherosclerotic disease throughout the anterior, middle, and posterior cerebral  arteries bilaterally. Electronically Signed   By: Marlan Palau M.D.   On: 05/10/2022 17:23   CT HEAD CODE STROKE WO CONTRAST  Result Date: 05/10/2022 CLINICAL DATA:  Code stroke. Neuro deficit, acute, stroke suspected CT head EXAM: CT HEAD WITHOUT CONTRAST TECHNIQUE: Contiguous axial images were obtained from the base of the skull through the vertex without intravenous contrast. RADIATION DOSE REDUCTION: This exam was performed according to the departmental dose-optimization program which includes automated exposure control, adjustment of the mA and/or kV according to patient size and/or use of iterative reconstruction technique. COMPARISON:  May 03, 2022. FINDINGS: Brain: No evidence of acute large vascular territory infarction, hemorrhage, hydrocephalus, extra-axial collection or mass lesion/mass effect. Small remote lacunar infarcts in the basal ganglia and thalami. Additional patchy white matter hypoattenuation, nonspecific but compatible with chronic microvascular ischemic disease. Cerebral atrophy Vascular: Calcific atherosclerosis.  No hyperdense vessel identified Skull: No acute fracture. Sinuses/Orbits: Frothy secretions in the left frontal sinus. Other: Mastoid effusions. ASPECTS [place] Stroke Program Early CT Score) total score (0-10 with 10 being normal): 10. Code stroke imaging results were communicated on 05/10/2022 at 4:23 pm to provider Modesto Charon via telephone, who verbally acknowledged these results. IMPRESSION: 1. Evidence of acute intracranial. ASPECTS is 10. 2. Remote lacunar infarcts, chronic microvascular ischemic disease, and cerebral atrophy (ICD10-G31.9). Electronically Signed   By: Feliberto Harts M.D.   On: 05/10/2022 16:23        Scheduled Meds:   stroke: early stages of recovery book   Does not apply Once   amantadine  100 mg Oral Daily   atenolol  50 mg Oral Daily   finasteride  5 mg Oral Daily   mometasone-formoterol  2 puff Inhalation BID   pantoprazole  40 mg  Oral Daily   tamsulosin  0.4 mg Oral QPC breakfast   Continuous Infusions:   LOS: 0 days    Time spent: 35 minutes.     Alba Cory, MD [facility]   If 7PM-7AM, please contact night-coverage [email or web address]  05/11/2022, 1:05 PM

## 2022-05-11 NOTE — Progress Notes (Addendum)
Neurology Progress Note   S://  Seen and examined in person today. He was seen as a telestroke yesterday by my colleague Dr. Erlinda Hong.  Concerns were for left-sided weakness and left facial droop.  88 year old with past history of complete heart block status post pacemaker, A-fib on Eliquis 2.5 mg dosage, PT, CKD 3A, recent TIA presented to the ED as a code stroke for left-sided weakness and facial droop with last known well at 3:30 PM.  Not a candidate for IV thrombolysis due to being on Eliquis.  Admitted for repeat head CT in 24 hours to evaluate for any evolving infarct versus clinical presentation secondary to a right splenic TIA  Had a recent admission for TIA with right-sided weakness that had resolved.   O:// Current vital signs: BP (!) 141/68 (BP Location: Right Arm)   Pulse (!) 59   Temp 98 F (36.7 C)   Resp 16   Ht 6' (1.829 m)   Wt 77 kg   SpO2 92%   BMI 23.02 kg/m  Vital signs in last 24 hours: Temp:  [97.3 F (36.3 C)-98.8 F (37.1 C)] 98 F (36.7 C) (10/18 0735) Pulse Rate:  [57-70] 59 (10/18 0735) Resp:  [16-22] 16 (10/18 0735) BP: (128-160)/(68-82) 141/68 (10/18 0735) SpO2:  [92 %-100 %] 92 % (10/18 0735) Weight:  [77 kg-81.6 kg] 77 kg (10/17 2146) General: Awake alert in no distress HEENT: Normocephalic atraumatic Lungs: Clear Cardiovascular: Irregularly [place] Neurological exam He is awake alert communicative He is able to tell me his name and date of birth. He is not able to tell me the current date or month He is able to tell me his age correctly He is able to name simple objects Definitely slow to respond to questions. He is able to repeat without a problem. Cranial nerves: Pupils equal round react light, extraocular movements appear unhindered and he tracks my finger on both sides pretty much equally although slow to respond to commands, blinks to threat from both sides, question some mild left facial droop. Motor examination with no drift in bilateral  upper extremities and symmetric antigravity strength with mild drift in bilateral lower extremities. Sensation intact to light touch, no neglect Coordination: Finger-nose-finger testing normal bilaterally.  Medications  Current Facility-Administered Medications:     stroke: early stages of recovery book, , Does not apply, Once, Cox, Amy N, DO   acetaminophen (TYLENOL) tablet 650 mg, 650 mg, Oral, Q4H PRN **OR** acetaminophen (TYLENOL) 160 MG/5ML solution 650 mg, 650 mg, Per Tube, Q4H PRN **OR** acetaminophen (TYLENOL) suppository 650 mg, 650 mg, Rectal, Q4H PRN, Cox, Amy N, DO   amantadine (SYMMETREL) capsule 100 mg, 100 mg, Oral, Daily, Cox, Amy N, DO   atenolol (TENORMIN) tablet 50 mg, 50 mg, Oral, Daily, Cox, Amy N, DO   docusate sodium (COLACE) capsule 100 mg, 100 mg, Oral, PRN, Cox, Amy N, DO   finasteride (PROSCAR) tablet 5 mg, 5 mg, Oral, Daily, Cox, Amy N, DO   mometasone-formoterol (DULERA) 200-5 MCG/ACT inhaler 2 puff, 2 puff, Inhalation, BID, Cox, Amy N, DO   Oral care mouth rinse, 15 mL, Mouth Rinse, PRN, Cox, Amy N, DO   pantoprazole (PROTONIX) EC tablet 40 mg, 40 mg, Oral, Daily, Cox, Amy N, DO   tamsulosin (FLOMAX) capsule 0.4 mg, 0.4 mg, Oral, QPC breakfast, Cox, Amy N, DO Labs CBC    Component Value Date/Time   WBC 8.2 05/11/2022 0505   RBC 4.25 05/11/2022 0505   HGB 12.3 (L) 05/11/2022  0505   HGB 13.2 06/30/2020 1304   HCT 37.4 (L) 05/11/2022 0505   HCT 40.1 06/30/2020 1304   PLT 221 05/11/2022 0505   PLT 263 06/30/2020 1304   MCV 88.0 05/11/2022 0505   MCV 87 06/30/2020 1304   MCH 28.9 05/11/2022 0505   MCHC 32.9 05/11/2022 0505   RDW 18.1 (H) 05/11/2022 0505   RDW 15.1 06/30/2020 1304   LYMPHSABS 1.6 05/10/2022 1702   LYMPHSABS 2.3 06/30/2020 1304   MONOABS 0.3 05/10/2022 1702   EOSABS 0.2 05/10/2022 1702   EOSABS 0.2 06/30/2020 1304   BASOSABS 0.1 05/10/2022 1702   BASOSABS 0.1 06/30/2020 1304    CMP     Component Value Date/Time   NA 136 05/10/2022  1702   NA 135 11/09/2021 1332   K 4.5 05/10/2022 1702   CL 104 05/10/2022 1702   CO2 24 05/10/2022 1702   GLUCOSE 140 (H) 05/10/2022 1702   BUN 14 05/10/2022 1702   BUN 15 11/09/2021 1332   CREATININE 1.26 (H) 05/10/2022 1702   CALCIUM 7.8 (L) 05/10/2022 1702   PROT 4.9 (L) 05/10/2022 1702   ALBUMIN 2.1 (L) 05/10/2022 1702   AST 28 05/10/2022 1702   ALT 7 05/10/2022 1702   ALKPHOS 287 (H) 05/10/2022 1702   BILITOT 1.1 05/10/2022 1702   GFRNONAA 52 (L) 05/10/2022 1702   GFRAA 58 (L) 06/30/2020 1304     LDL 67 Glycosylated hemoglobin 5.6   Most recent echocardiogram on 05/03/2022 showed an LVEF of 45 to 50% with regional wall motion abnormalities, mild concentric LVH, grade 1 diastolic dysfunction of the left ventricle, normal mitral valve, normal aortic valve, normal left and right atrial size.  Imaging I have reviewed images in epic and the results pertinent to this consultation are: CT head aspects 10, no bleed, remote lacunar infarction and chronic microvascular ischemic disease. CT angiography head and neck: No significant carotid or vertebral artery stenosis in the neck.  No intracranial large vessel occlusion.  Intracranial vascular disease throughout the anterior middle and posterior cerebral arteries bilaterally.  Assessment: 88 year old man past history as above brought in with concerns of left-sided facial droop and left-sided weakness.  Due to Eliquis, not a candidate for IV thrombolysis.  CT angiography head and neck with no emergent LVO but showed intracranial stenosis and bilateral anterior and posterior circulations. Patient has a pacemaker, cannot do MRI at this facility. Plan is to repeat CT head in 24 hours.  If no bleeding or large stroke seen on CT, will resume Eliquis then. Currently on Eliquis 2.5 twice daily.  Need to check with pharmacy if he requires higher dose.  Differentials include small right hemispheric stroke versus TIA.  Other differentials could  include electrographic abnormality such as seizures with postictal Todd's paralysis.  Other differentials to consider are autonomic instability/orthostatic hypotension/syncope due to underlying Parkinson's disease.  Recommendations: Frequent neurochecks Telemetry CT head at 4 PM Routine EEG Hold Eliquis for now.  If the 4 PM CT head does not show any large bleed or evolving stroke, will resume Eliquis.  Need to check with pharmacy about dosing. No need for repeat echo-1 was done about a week ago. No need for statin-LDL at goal Interrogate pacemaker Will follow CT head and EEG. Plan relayed to Dr. Sherryl Manges -- Amie Portland, MD Neurologist [facility] Pager: [phone]  ADDENDUM  EEG reviewed: Normal  Repeat head CT: no evidence of bleed or large infarct.  Recommendations In addition to the recommendations from the progress  note from this morning, I would recommend resuming his Eliquis-check with the pharmacy, the correct dosing would be 5 mg twice daily.  Okay to start tonight. No further inpatient recommendations at this time Inpatient neurology will be available as needed Outpatient follow-up in 4 to 6 weeks.  Plan relayed to Dr. Tyrell Antonio   -- Amie Portland, MD Neurologist [facility] Pager: [phone]

## 2022-05-11 NOTE — Procedures (Signed)
Patient Name: Justin Hendrix  MRN: [id number]  Epilepsy Attending: Lora Havens  Referring Physician/Provider: Rosalin Hawking, MD  Date: 05/11/2022 Duration: 26.20 mins  Patient history: 88 year old man past history as above brought in with concerns of left-sided facial droop and left-sided weakness. EEG to evaluate for seizure  Level of alertness: Awake, asleep  AEDs during EEG study: None  Technical aspects: This EEG study was done with scalp electrodes positioned according to the 10-20 International system of electrode placement. Electrical activity was reviewed with band pass filter of 1-70Hz , sensitivity of 7 uV/mm, display speed of 35mm/sec with a 60Hz  notched filter applied as appropriate. EEG data were recorded continuously and digitally stored.  Video monitoring was available and reviewed as appropriate.  Description: The posterior dominant rhythm consists of 8 Hz activity of moderate voltage (25-35 uV) seen predominantly in posterior head regions, symmetric and reactive to eye opening and eye closing. Sleep was characterized by vertex waves, sleep spindles (12 to 14 Hz), maximal frontocentral region.  Physiologic photic driving was not seen during photic stimulation.  Hyperventilation was not performed.     IMPRESSION: This study is within normal limits. No seizures or epileptiform discharges were seen throughout the recording.  A normal interictal EEG does not exclude the diagnosis of epilepsy.   [name] [name]

## 2022-05-11 NOTE — Progress Notes (Signed)
PT Cancellation Note  Patient Details Name: Justin Hendrix MRN: [id number] DOB: [birth year]   Cancelled Treatment:    Reason Eval/Treat Not Completed: Patient at procedure or test/unavailable (Chart reviewed, evaluation attempted. Pt off unit for imaging. Spoke with son/caregiver, who reports pt seems to have returned to baseline, but has not yet been out of bed.) Will attempt evaluation again at later date/time. Encouraged son to coordinate with NSG to all OOB with patient later in day to ascertain any acute changes to basic mobility.   3:56 PM, 05/11/22 Etta Grandchild, PT, DPT Physical Therapist - [facility]  [phone] ([place])    [place] C 05/11/2022, 3:55 PM

## 2022-05-12 ENCOUNTER — Ambulatory Visit: Payer: Medicare PPO | Admitting: Podiatry

## 2022-05-12 DIAGNOSIS — Z66 Do not resuscitate: Secondary | ICD-10-CM

## 2022-05-12 DIAGNOSIS — N401 Enlarged prostate with lower urinary tract symptoms: Secondary | ICD-10-CM | POA: Diagnosis not present

## 2022-05-12 DIAGNOSIS — Z515 Encounter for palliative care: Secondary | ICD-10-CM

## 2022-05-12 DIAGNOSIS — G20A1 Parkinson's disease without dyskinesia, without mention of fluctuations: Secondary | ICD-10-CM | POA: Diagnosis not present

## 2022-05-12 DIAGNOSIS — G459 Transient cerebral ischemic attack, unspecified: Secondary | ICD-10-CM | POA: Diagnosis not present

## 2022-05-12 LAB — URINALYSIS, ROUTINE W REFLEX MICROSCOPIC
Bacteria, UA: NONE SEEN
Bilirubin Urine: NEGATIVE
Glucose, UA: NEGATIVE mg/dL
Hgb urine dipstick: NEGATIVE
Ketones, ur: NEGATIVE mg/dL
Leukocytes,Ua: NEGATIVE
Nitrite: NEGATIVE
Protein, ur: 30 mg/dL — AB
Specific Gravity, Urine: 1.042 — ABNORMAL HIGH (ref 1.005–1.030)
pH: 5 (ref 5.0–8.0)

## 2022-05-12 LAB — URINE DRUG SCREEN, QUALITATIVE (ARMC ONLY)
Amphetamines, Ur Screen: NOT DETECTED
Barbiturates, Ur Screen: NOT DETECTED
Benzodiazepine, Ur Scrn: NOT DETECTED
Cannabinoid 50 Ng, Ur ~~LOC~~: NOT DETECTED
Cocaine Metabolite,Ur ~~LOC~~: NOT DETECTED
MDMA (Ecstasy)Ur Screen: NOT DETECTED
Methadone Scn, Ur: NOT DETECTED
Opiate, Ur Screen: NOT DETECTED
Phencyclidine (PCP) Ur S: NOT DETECTED
Tricyclic, Ur Screen: NOT DETECTED

## 2022-05-12 LAB — CBC
HCT: 37.8 % — ABNORMAL LOW (ref 39.0–52.0)
Hemoglobin: 12.5 g/dL — ABNORMAL LOW (ref 13.0–17.0)
MCH: 29.4 pg (ref 26.0–34.0)
MCHC: 33.1 g/dL (ref 30.0–36.0)
MCV: 88.9 fL (ref 80.0–100.0)
Platelets: 234 10*3/uL (ref 150–400)
RBC: 4.25 MIL/uL (ref 4.22–5.81)
RDW: 18.1 % — ABNORMAL HIGH (ref 11.5–15.5)
WBC: 7.2 10*3/uL (ref 4.0–10.5)
nRBC: 0 % (ref 0.0–0.2)

## 2022-05-12 LAB — BASIC METABOLIC PANEL
Anion gap: 4 — ABNORMAL LOW (ref 5–15)
BUN: 13 mg/dL (ref 8–23)
CO2: 24 mmol/L (ref 22–32)
Calcium: 8 mg/dL — ABNORMAL LOW (ref 8.9–10.3)
Chloride: 108 mmol/L (ref 98–111)
Creatinine, Ser: 1.34 mg/dL — ABNORMAL HIGH (ref 0.61–1.24)
GFR, Estimated: 48 mL/min — ABNORMAL LOW (ref >60)
Glucose, Bld: 117 mg/dL — ABNORMAL HIGH (ref 70–99)
Potassium: 3.4 mmol/L — ABNORMAL LOW (ref 3.5–5.1)
Sodium: 136 mmol/L (ref 135–145)

## 2022-05-12 MED ORDER — MAGNESIUM SULFATE 2 GM/50ML IV SOLN
2.0000 g | Freq: Once | INTRAVENOUS | Status: AC
  Administered 2022-05-12: 2 g via INTRAVENOUS
  Filled 2022-05-12: qty 50

## 2022-05-12 MED ORDER — ENSURE ENLIVE PO LIQD
237.0000 mL | Freq: Two times a day (BID) | ORAL | Status: DC
  Administered 2022-05-12: 237 mL via ORAL

## 2022-05-12 MED ORDER — APIXABAN 5 MG PO TABS: 5.0000 mg | ORAL_TABLET | Freq: Two times a day (BID) | ORAL | 2 refills | Status: AC

## 2022-05-12 NOTE — TOC Transition Note (Signed)
Transition of Care [facility]) - CM/SW Discharge Note   Patient Details  Name: Justin Hendrix MRN: [id number] Date of Birth: Aug 24, 1922  Transition of Care [facility]) CM/SW Contact:  Colen Darling, [place] Phone Number: 05/12/2022, 4:39 PM   Clinical Narrative:     Patient and family are agreeable to palliative care nurse from [place] and and [place] and [place] from [place]. Tommi Rumps will assist from [place]. SW reached out to [place] from [employer] [employer] about palliative care RN.  Final next level of care: [place]     Patient Goals and CMS Choice Patient states their goals for this hospitalization and ongoing recovery are:: return home with palliative care RN and HHPT and [place] CMS Medicare.gov Compare Post Acute Care list provided to:: Patient Represenative (must comment) Marcy Si[place]) Choice offered to / list presented to : Adult Children  Discharge Placement                  Name of family member notified: Livan Hires Patient and family notified of of transfer: 05/12/22  Discharge Plan and [place]: [place] Date [place]: 05/12/22   Representative spoke with at [place]: [place] ([place]) Interventions     Readmission Risk Interventions     No data to display

## 2022-05-12 NOTE — Plan of Care (Signed)
  Problem: Education: Goal: Knowledge of General Education information will improve Description: Including pain rating scale, medication(s)/side effects and non-pharmacologic comfort measures Outcome: Adequate for Discharge   Problem: Health Behavior/Discharge Planning: Goal: Ability to manage health-related needs will improve Outcome: Adequate for Discharge   Problem: Clinical Measurements: Goal: Ability to maintain clinical measurements within normal limits will improve Outcome: Adequate for Discharge Goal: Will remain free from infection Outcome: Adequate for Discharge Goal: Diagnostic test results will improve Outcome: Adequate for Discharge Goal: Respiratory complications will improve Outcome: Adequate for Discharge Goal: Cardiovascular complication will be avoided Outcome: Adequate for Discharge   Problem: Activity: Goal: Risk for activity intolerance will decrease Outcome: Adequate for Discharge   Problem: Nutrition: Goal: Adequate nutrition will be maintained Outcome: Adequate for Discharge   Problem: Coping: Goal: Level of anxiety will decrease Outcome: Adequate for Discharge   Problem: Elimination: Goal: Will not experience complications related to bowel motility Outcome: Adequate for Discharge Goal: Will not experience complications related to urinary retention Outcome: Adequate for Discharge   Problem: Pain Managment: Goal: General experience of comfort will improve Outcome: Adequate for Discharge   Problem: Safety: Goal: Ability to remain free from injury will improve Outcome: Adequate for Discharge   Problem: Skin Integrity: Goal: Risk for impaired skin integrity will decrease Outcome: Adequate for Discharge   Problem: Education: Goal: Knowledge of secondary prevention will improve (SELECT ALL) Outcome: Adequate for Discharge Goal: Knowledge of patient specific risk factors will improve (INDIVIDUALIZE FOR PATIENT) Outcome: Adequate for Discharge    Problem: Coping: Goal: Will verbalize positive feelings about self Outcome: Adequate for Discharge   Problem: Self-Care: Goal: Ability to participate in self-care as condition permits will improve Outcome: Adequate for Discharge Goal: Ability to communicate needs accurately will improve Outcome: Adequate for Discharge   Problem: Nutrition: Goal: Risk of aspiration will decrease Outcome: Adequate for Discharge   Problem: Ischemic Stroke/TIA Tissue Perfusion: Goal: Complications of ischemic stroke/TIA will be minimized Outcome: Adequate for Discharge   Problem: Education: Goal: Knowledge of disease or condition will improve Outcome: Adequate for Discharge Goal: Knowledge of secondary prevention will improve (MUST DOCUMENT ALL) Outcome: Adequate for Discharge Goal: Knowledge of patient specific risk factors will improve Elta [place] N/A or DELETE if not current risk factor) Outcome: Adequate for Discharge   Problem: Ischemic Stroke/TIA Tissue Perfusion: Goal: Complications of ischemic stroke/TIA will be minimized Outcome: Adequate for Discharge   Reviewed with patient and son [name] at bedside. IV and tele removed. Pt to dc home.

## 2022-05-12 NOTE — Consult Note (Signed)
Consultation Note Date: 05/12/2022   Patient Name: Justin Hendrix  DOB: 1922-08-28  MRN: [id number]  Age / Sex: 88 y.o., male  PCP: Albina Billet, MD Referring Physician: Elmarie Shiley, MD  Reason for Consultation: Establishing goals of care  HPI/Patient Profile: 88 year old with past medical history significant for TIA, Parkinson disease, paroxysmal A-fib, GERD, CKD stage IIIa presents with concern of right-sided weakness and facial droop.  Recent admission for left side weakness.  Patient was evaluated by telemetry neurology Dr. Erlinda Hong who did not recommended tPA because patient was already on Eliquis.  Patient's symptoms subsequently has resolved.  Plan is for d/c home with [facility] PT/OT.   PMT was consulted in light of patient's FTT and repeat hospitalizations.   Clinical Assessment and Goals of Care: I have reviewed medical records including EPIC notes, labs and imaging, assessed the patient and then met with patient and his son Justin Hendrix at bedside to discuss diagnosis prognosis, [place], EOL wishes, disposition and options.  I introduced Palliative Medicine as specialized medical care for people living with serious illness. It focuses on providing relief from the symptoms and stress of a serious illness. The goal is to improve quality of life for both the patient and the family.  We discussed a brief life review of the patient. Patient worked as a Programmer, multimedia. He and his deceased wife had four children together (2 deceased, pt lives with 2 living sons).   We discussed patient's current illness and what it means in the larger context of patient's on-going co-morbidities.  Natural disease trajectory and expectations at EOL were discussed. Education provided on functional, nutritional, and cognitive status and prognostic indicators.  I attempted to elicit values and goals of care important to the  patient.  Patient shares he wants to go home.  Patient's son shares he wants to help his father have as much independence at home.  Father understands that patient has poor p.o. intake but is accepting of this being his father's choice.  Father is agreeable for home health PT and OT.  The difference between aggressive medical intervention and comfort care was considered in light of the patient's goals of care.  Outpatient palliative services and hospice services discussed in detail.  Advance directives, concepts specific to code status, artificial feeding and hydration, and rehospitalization were considered and discussed.  DNR remains.  Education offered regarding concept specific to human mortality and the limitations of medical interventions to prolong life when the body begins to fail to thrive.   Discussed with patient/family the importance of continued conversation with family and the medical providers regarding overall plan of care and treatment options, ensuring decisions are within the context of the patient's values and GOCs.    Patient and Justin Hendrix are excepting of outpatient palliative services.  TOC referral placed to offer choice of palliative agencies.  Questions and concerns were addressed. The family was encouraged to call with questions or concerns.   PMT will remain available to patient and family during this  hospitalization, the plan is for discharge today.  Primary Decision Maker PATIENT  Physical Exam Vitals reviewed.  HENT:     Head: Normocephalic.     Mouth/Throat:     Mouth: Mucous membranes are moist.  Eyes:     Pupils: Pupils are equal, round, and reactive to light.  Cardiovascular:     Rate and Rhythm: Normal rate.     Pulses: Normal pulses.  Pulmonary:     Effort: Pulmonary effort is normal.  Abdominal:     Palpations: Abdomen is soft.  Musculoskeletal:     Comments: MAETC  Skin:    General: Skin is warm and dry.  Neurological:     Mental Status: He is  alert and oriented to person, place, and time.  Psychiatric:        Mood and Affect: Mood normal.        Behavior: Behavior normal.        Thought Content: Thought content normal.        Judgment: Judgment normal.     Palliative Assessment/Data: 50%     Thank you for this consult. Palliative medicine will continue to follow and assist holistically.   Time Total: 75 minutes Greater than 50%  of this time was spent counseling and coordinating care related to the above assessment and plan.  Signed by: Jordan Hawks, DNP, FNP-BC Palliative Medicine    Please contact Palliative Medicine Team phone at [phone] for questions and concerns.  For individual provider: See Shea Evans

## 2022-05-12 NOTE — TOC Progression Note (Addendum)
Transition of Care [facility]) - Progression Note    Patient Details  Name: DONTAVION NOXON MRN: [id number] Date of Birth: [birth year]  Transition of Care [facility]) CM/SW [place], [place] Phone Number: 05/12/2022, 4:04 PM  Clinical Narrative:     SW spoke to the patient and his son Elise Gladden [phone]. The palliative care NP states that the patient and son have agreed to outpatient palliative care and home health as they are not ready for home hospice.  SW is providing the family and patient with a list of agencies to choose from for palliative RN and home OT/PT.  1637: SW reached out to [facility] and they can provide the [place] and [place]. SW left a message for [facility] liaison Lorayne Bender at [phone] regarding a palliative care RN. Family and patient are agreeable to services from [place] and [place].      Expected Discharge Plan and Services           Expected Discharge Date: 05/12/22                      HHPT, HHOT and palliative care RN               Social Determinants of Health (SDOH) Interventions    Readmission Risk Interventions     No data to display

## 2022-05-12 NOTE — Discharge Summary (Signed)
Physician Discharge Summary   Patient: Justin Hendrix MRN: [id number] DOB: 10/22/1922  Admit date:     05/10/2022  Discharge date: 05/12/22  Discharge Physician: Alba CoryBelkys A Mcclain Shall   PCP: Jaclyn Shaggyate, Denny C, MD   Recommendations at discharge:   Palliative care follow up at home. HH PT as well.  Encourage oral intake.    Discharge Diagnoses: Principal Problem:   TIA (transient ischemic attack) Active Problems:   History of TIA (transient ischemic attack)   Benign prostatic hyperplasia with lower urinary tract symptoms   Parkinson's disease (HCC)   Paroxysmal atrial fibrillation (HCC)   GERD without esophagitis   Chronic kidney disease, stage 3a (HCC)   Elevated alkaline phosphatase level  Resolved Problems:   * No resolved hospital problems. *  Hospital Course: 88 year old with past medical history significant for TIA, Parkinson disease, paroxysmal A-fib, GERD, CKD stage IIIa presents with concern of right-sided weakness and facial droop.  Recent admission for left side weakness.  Patient was evaluated by telemetry neurology Dr. Roda ShuttersXu who did not recommended tPA because patient was already on Eliquis. Patient's symptoms subsequently has resolved.  Plan is to proceed for evaluation of stroke with repeating CT head today at 4:00, EEG negative,  orthostatic vitals positive, received IV fluids. Orthostatic hypotension resolved, likely component of dehydration due to poor oral intake. Palliative care consulted. Plan for palliative follow up at home.     Assessment and Plan: 1-Right side weakness, facial droop:  TIA Vs ruled out seizure, orthostatic hypotension.  CT head stable. Back on eliquis 5 mg BID>  PT, OT eval. HH PT EEG. negative Appreciate neurology evaluation.  TIA vs related to orthostatic hypotension.    Transaminase; chronic elevation.  Follow up out patient     CKD stage IIIa:  Cr stable.    GERD; PPI   Paroxysmal A fib  Pacemaker.  Cardiology will assist with  pacemake interrogation. Few SVT, short, no a fib.    FTT; not eating much for probably a moth per son. KUB negative. TSH. Palliative care consult for goals of care. Plan to discharge home with palliative care follow up.             Consultants: Neurology  Procedures performed: EEG Disposition: Home Diet recommendation:  Discharge Diet Orders (From admission, onward)     Start     Ordered   05/12/22 0000  Diet - low sodium heart healthy        05/12/22 1206           Regular diet DISCHARGE MEDICATION: Allergies as of 05/12/2022   No Known Allergies      Medication List     TAKE these medications    Amantadine HCl 100 MG tablet Take 100 mg by mouth daily.   apixaban 5 MG Tabs tablet Commonly known as: ELIQUIS Take 1 tablet (5 mg total) by mouth 2 (two) times daily. What changed:  medication strength how much to take   atenolol 50 MG tablet Commonly known as: TENORMIN Take 1 tablet (50 mg) by mouth once daily   budesonide-formoterol 160-4.5 MCG/ACT inhaler Commonly known as: SYMBICORT Inhale 2 puffs into the lungs 2 (two) times daily as needed (respiratory difficulty).   docusate sodium 100 MG capsule Commonly known as: COLACE Take 1 capsule (100 mg total) by mouth 2 (two) times daily.   finasteride 5 MG tablet Commonly known as: PROSCAR Take 1 tablet (5 mg total) by mouth daily.   fluticasone 50 MCG/ACT nasal  spray Commonly known as: FLONASE 2 sprays by Each Nare route daily. as needed   pantoprazole 40 MG tablet Commonly known as: PROTONIX Take 40 mg by mouth daily.   tamsulosin 0.4 MG Caps capsule Commonly known as: FLOMAX TAKE 1 CAPSULE BY MOUTH ONCE DAILY 30 MINUTES AFTER LARGEST MEAL.        Discharge Exam: Filed Weights   05/10/22 1650 05/10/22 2146  Weight: 81.6 kg 77 kg   General; NAD  Condition at discharge: fair  The results of significant diagnostics from this hospitalization (including imaging, microbiology, ancillary  and laboratory) are listed below for reference.   Imaging Studies: DG Abd 1 View  Result Date: 05/11/2022 CLINICAL DATA:  Constipation. EXAM: ABDOMEN - 1 VIEW COMPARISON:  None Available. FINDINGS: No significant colonic stool burden. There is distal colonic diverticulosis. No bowel dilatation or evidence of obstruction. No free air. Excreted contrast noted within the urinary bladder. The bladder wall appears trabeculated. There is osteopenia with degenerative changes of the spine. Partially visualized right femoral ORIF. IMPRESSION: 1. No significant colonic stool burden. 2. Trabeculated bladder wall. Electronically Signed   By: Elgie Collard M.D.   On: 05/11/2022 18:19   CT HEAD WO CONTRAST ( )  Result Date: 05/11/2022 CLINICAL DATA:  Stroke follow-up. EXAM: CT HEAD WITHOUT CONTRAST TECHNIQUE: Contiguous axial images were obtained from the base of the skull through the vertex without intravenous contrast. RADIATION DOSE REDUCTION: This exam was performed according to the departmental dose-optimization program which includes automated exposure control, adjustment of the mA and/or kV according to patient size and/or use of iterative reconstruction technique. COMPARISON:  CT head May 10, 2022. FINDINGS: Brain: No evidence of acute large vascular territory infarction, hemorrhage, hydrocephalus, extra-axial collection or mass lesion/mass effect. Small remote lacunar infarcts in the basal ganglia and thalami. Vascular: No hyperdense vessel identified. Calcific atherosclerosis. Skull: No acute fracture. Sinuses/Orbits: Left frontal sinus secretions. Otherwise, clear sinuses. No acute orbital findings. Other: No mastoid effusions. IMPRESSION: No evidence of acute intracranial abnormality. Electronically Signed   By: Feliberto Harts M.D.   On: 05/11/2022 15:46   EEG adult  Result Date: 05/11/2022 Charlsie Quest, MD     05/11/2022  3:24 PM Patient Name: Justin Hendrix MRN: [id number] Epilepsy  Attending: Charlsie Quest Referring Physician/Provider: Marvel Plan, MD Date: 05/11/2022 Duration: 26.20 mins Patient history: 88 year old man past history as above brought in with concerns of left-sided facial droop and left-sided weakness. EEG to evaluate for seizure Level of alertness: Awake, asleep AEDs during EEG study: None Technical aspects: This EEG study was done with scalp electrodes positioned according to the 10-20 International system of electrode placement. Electrical activity was reviewed with band pass filter of 1-70Hz , sensitivity of 7 uV/mm, display speed of 3mm/sec with a  notched filter applied as appropriate. EEG data were recorded continuously and digitally stored.  Video monitoring was available and reviewed as appropriate. Description: The posterior dominant rhythm consists of 8 Hz activity of moderate voltage (25-35 uV) seen predominantly in posterior head regions, symmetric and reactive to eye opening and eye closing. Sleep was characterized by vertex waves, sleep spindles (12 to 14 Hz), maximal frontocentral region.  Physiologic photic driving was not seen during photic stimulation.  Hyperventilation was not performed.   IMPRESSION: This study is within normal limits. No seizures or epileptiform discharges were seen throughout the recording. A normal interictal EEG does not exclude the diagnosis of epilepsy. Priyanka Annabelle Harman   CT ANGIO HEAD NECK W WO  CM (CODE STROKE)  Result Date: 05/10/2022 CLINICAL DATA:  Acute neuro deficit rule out stroke EXAM: CT ANGIOGRAPHY HEAD AND NECK TECHNIQUE: Multidetector CT imaging of the head and neck was performed using the standard protocol during bolus administration of intravenous contrast. Multiplanar CT image reconstructions and MIPs were obtained to evaluate the vascular anatomy. Carotid stenosis measurements (when applicable) are obtained utilizing NASCET criteria, using the distal internal carotid diameter as the denominator. RADIATION  DOSE REDUCTION: This exam was performed according to the departmental dose-optimization program which includes automated exposure control, adjustment of the mA and/or kV according to patient size and/or use of iterative reconstruction technique. CONTRAST:  27mL OMNIPAQUE IOHEXOL 350 MG/ML SOLN COMPARISON:  CT head 05/10/2022 FINDINGS: CTA NECK FINDINGS Aortic arch: Aortic arch incompletely imaged. There is atherosclerotic calcification in the visualized arch. The origin of the innominate artery and left common carotid artery not imaged. Left subclavian artery patent. Right carotid system: Right carotid artery widely patent without stenosis. Left carotid system: Left carotid widely patent without stenosis. Vertebral arteries: Both vertebral arteries patent to the skull base without stenosis. Left vertebral artery dominant. Skeleton: Cervical spondylosis. No acute skeletal abnormality. Chronic appearing superior endplate fracture of T3. Other neck: Negative for mass or adenopathy. Upper chest: Left-sided transvenous pacemaker. Visualized lung apices clear bilaterally. Review of the MIP images confirms the above findings CTA HEAD FINDINGS Anterior circulation: Atherosclerotic calcification in the cavernous carotid bilaterally without stenosis. No large vessel occlusion in the anterior or middle cerebral arteries. There is atherosclerotic irregularity and stenosis in the M1 and M2 branches bilaterally. Moderate atherosclerotic disease in the anterior cerebral arteries bilaterally Posterior circulation: Left vertebral artery widely patent and dominant. Mild atherosclerotic disease left vertebral artery at the skull base. Small right vertebral artery with faint opacification distally but no significant stenosis. Right vertebral artery extends to the basilar. Basilar patent. Mild atherosclerotic disease in the basilar. Posterior cerebral arteries patent without occlusion. There is atherosclerotic irregularity in the  posterior cerebral arteries bilaterally. Venous sinuses: Limited venous enhancement due to arterial phase scanning. Anatomic variants: None Review of the MIP images confirms the above findings IMPRESSION: 1. No significant carotid or vertebral artery stenosis in the neck. 2. No intracranial large vessel occlusion. 3. Intracranial atherosclerotic disease throughout the anterior, middle, and posterior cerebral arteries bilaterally. Electronically Signed   By: Marlan Palau M.D.   On: 05/10/2022 17:23   CT HEAD CODE STROKE WO CONTRAST  Result Date: 05/10/2022 CLINICAL DATA:  Code stroke. Neuro deficit, acute, stroke suspected CT head EXAM: CT HEAD WITHOUT CONTRAST TECHNIQUE: Contiguous axial images were obtained from the base of the skull through the vertex without intravenous contrast. RADIATION DOSE REDUCTION: This exam was performed according to the departmental dose-optimization program which includes automated exposure control, adjustment of the mA and/or kV according to patient size and/or use of iterative reconstruction technique. COMPARISON:  May 03, 2022. FINDINGS: Brain: No evidence of acute large vascular territory infarction, hemorrhage, hydrocephalus, extra-axial collection or mass lesion/mass effect. Small remote lacunar infarcts in the basal ganglia and thalami. Additional patchy white matter hypoattenuation, nonspecific but compatible with chronic microvascular ischemic disease. Cerebral atrophy Vascular: Calcific atherosclerosis.  No hyperdense vessel identified Skull: No acute fracture. Sinuses/Orbits: Frothy secretions in the left frontal sinus. Other: Mastoid effusions. ASPECTS [facility] Stroke Program Early CT Score) total score (0-10 with 10 being normal): 10. Code stroke imaging results were communicated on 05/10/2022 at 4:23 pm to provider Modesto Charon via telephone, who verbally acknowledged these results. IMPRESSION: 1.  Evidence of acute intracranial. ASPECTS is 10. 2. Remote lacunar  infarcts, chronic microvascular ischemic disease, and cerebral atrophy (ICD10-G31.9). Electronically Signed   By: Feliberto Harts M.D.   On: 05/10/2022 16:23   CUP PACEART REMOTE DEVICE CHECK  Result Date: 05/04/2022 Scheduled remote reviewed. Normal device function.  22 AMS, 6sec - 14sec in duration, PAT Next remote 91 days. LA  ECHOCARDIOGRAM COMPLETE  Result Date: 05/03/2022    ECHOCARDIOGRAM REPORT   Patient Name:   BENJI POYNTER Date of Exam: 05/03/2022 Medical Rec #:  [id number]         Height:       72.0 in Accession #:    [id number]        Weight:       164.7 lb Date of Birth:  03-10-1923          BSA:          1.962 m Patient Age:    88 years          BP:           136/76 mmHg Patient Gender: M                 HR:           56 bpm. Exam Location:  [facility] Procedure: 2D Echo, Cardiac Doppler and Color Doppler Indications:     TIA G45.9  History:         Patient has prior history of Echocardiogram examinations, most                  recent 11/03/2017.  Sonographer:     Cristela Blue Referring Phys:  [id number] Marrion Coy Diagnosing Phys: Alwyn Pea MD  Sonographer Comments: Technically difficult study due to poor echo windows, suboptimal apical window and suboptimal subcostal window. IMPRESSIONS  1. Left ventricular ejection fraction, by estimation, is 45 to 50%. The left ventricle has mildly decreased function. The left ventricle demonstrates regional wall motion abnormalities (see scoring diagram/findings for description). There is mild concentric left ventricular hypertrophy. Left ventricular diastolic parameters are consistent with Grade I diastolic dysfunction (impaired relaxation).  2. Right ventricular systolic function is normal. The right ventricular size is normal.  3. The mitral valve is normal in structure. Trivial mitral valve regurgitation.  4. The aortic valve is normal in structure. Aortic valve regurgitation is mild. Aortic valve sclerosis is present, with no evidence of  aortic valve stenosis. FINDINGS  Left Ventricle: Mild inferior basal hypo. Left ventricular ejection fraction, by estimation, is 45 to 50%. The left ventricle has mildly decreased function. The left ventricle demonstrates regional wall motion abnormalities. The left ventricular internal cavity size was normal in size. There is mild concentric left ventricular hypertrophy. Left ventricular diastolic parameters are consistent with Grade I diastolic dysfunction (impaired relaxation). Right Ventricle: The right ventricular size is normal. No increase in right ventricular wall thickness. Right ventricular systolic function is normal. Left Atrium: Left atrial size was normal in size. Right Atrium: Right atrial size was normal in size. Pericardium: There is no evidence of pericardial effusion. Mitral Valve: The mitral valve is normal in structure. Trivial mitral valve regurgitation. Tricuspid Valve: The tricuspid valve is normal in structure. Tricuspid valve regurgitation is mild. Aortic Valve: The aortic valve is normal in structure. Aortic valve regurgitation is mild. Aortic regurgitation PHT measures 475 msec. Aortic valve sclerosis is present, with no evidence of aortic valve stenosis. Aortic valve mean gradient measures 5.7 mmHg. Aortic  valve peak gradient measures 10.0 mmHg. Aortic valve area, by VTI measures 2.27 cm. Pulmonic Valve: The pulmonic valve was normal in structure. Pulmonic valve regurgitation is not visualized. Aorta: The ascending aorta was not well visualized. IAS/Shunts: No atrial level shunt detected by color flow Doppler.  LEFT VENTRICLE PLAX 2D LVIDd:         2.70 cm   Diastology LVIDs:         2.20 cm   LV e' medial:    3.92 cm/s LV PW:         1.00 cm   LV E/e' medial:  15.6 LV IVS:        1.60 cm   LV e' lateral:   4.03 cm/s LVOT diam:     2.00 cm   LV E/e' lateral: 15.2 LV SV:         54 LV SV Index:   28 LVOT Area:     3.14 cm  RIGHT VENTRICLE RV Basal diam:  3.30 cm RV Mid diam:    2.90 cm  LEFT ATRIUM             Index        RIGHT ATRIUM           Index LA diam:        2.20 cm 1.12 cm/m   RA Area:     15.50 cm LA Vol (A2C):   24.7 ml 12.59 ml/m  RA Volume:   44.50 ml  22.68 ml/m LA Vol (A4C):   44.5 ml 22.68 ml/m LA Biplane Vol: 34.0 ml 17.33 ml/m  AORTIC VALVE AV Area (Vmax):    1.73 cm AV Area (Vmean):   2.13 cm AV Area (VTI):     2.27 cm AV Vmax:           158.33 cm/s AV Vmean:          106.733 cm/s AV VTI:            0.240 m AV Peak Grad:      10.0 mmHg AV Mean Grad:      5.7 mmHg LVOT Vmax:         87.00 cm/s LVOT Vmean:        72.200 cm/s LVOT VTI:          0.173 m LVOT/AV VTI ratio: 0.72 AI PHT:            475 msec  AORTA Ao Root diam: 3.00 cm MITRAL VALVE               TRICUSPID VALVE MV Area (PHT): 2.08 cm    TR Peak grad:   16.8 mmHg MV Decel Time: 364 msec    TR Vmax:        205.00 cm/s MV E velocity: 61.30 cm/s MV A velocity: 77.60 cm/s  SHUNTS MV E/A ratio:  0.79        Systemic VTI:  0.17 m                            Systemic Diam: 2.00 cm Dwayne Salome Arnt MD Electronically signed by Alwyn Pea MD Signature Date/Time: 05/03/2022/4:17:58 PM    Final    CT HEAD WO CONTRAST ( )  Result Date: 05/03/2022 CLINICAL DATA:  Stroke follow up EXAM: CT HEAD WITHOUT CONTRAST TECHNIQUE: Contiguous axial images were obtained from the base of the skull through the vertex without intravenous contrast. RADIATION  DOSE REDUCTION: This exam was performed according to the departmental dose-optimization program which includes automated exposure control, adjustment of the mA and/or kV according to patient size and/or use of iterative reconstruction technique. COMPARISON:  CT Head 05/02/22 FINDINGS: Brain: No evidence of acute infarction, hemorrhage, hydrocephalus, extra-axial collection or mass lesion/mass effect. Sequela of severe chronic microvascular ischemic change with chronic left thalamic and basal ganglia lacunar infarcts. Advanced generalized volume loss. Vascular: No  hyperdense vessel or unexpected calcification. Skull: Normal. Negative for fracture or focal lesion. Degenerative changes at the left TMJ. Sinuses/Orbits: Mucosal thickening left frontal sinus. Redemonstrated right-sided mastoid effusion. Bilateral lens replacements. Other: None. IMPRESSION: 1. No acute intracranial abnormality. 2. Sequela of severe chronic microvascular ischemic change with chronic left thalamic and basal ganglia lacunar infarcts. 3. Redemonstrated right-sided mastoid effusion. Electronically Signed   By: Lorenza Cambridge M.D.   On: 05/03/2022 13:19   US Carotid Bilateral (at [facility] and [facility] only)  Result Date: 05/02/2022 CLINICAL DATA:  Transient ischemic attack.  History of tobacco use EXAM: BILATERAL CAROTID DUPLEX ULTRASOUND TECHNIQUE: Wallace Cullens scale imaging, color Doppler and duplex ultrasound were performed of bilateral carotid and vertebral arteries in the neck. COMPARISON:  11/29/2007 FINDINGS: Criteria: Quantification of carotid stenosis is based on velocity parameters that correlate the residual internal carotid diameter with NASCET-based stenosis levels, using the diameter of the distal internal carotid lumen as the denominator for stenosis measurement. The following velocity measurements were obtained (PSV/EDV): RIGHT ICA: 34/9 cm/sec CCA: 50/6 cm/sec SYSTOLIC ICA/CCA RATIO:  0.7 ECA: 76 cm/sec LEFT ICA: 56/21 cm/sec CCA: 46/7 cm/sec SYSTOLIC ICA/CCA RATIO:  1.2 ECA: 51 cm/sec RIGHT CAROTID ARTERY: Normal color flow and velocity waveforms. Bifurcation is patent without significant calcific or noncalcific stenosis. RIGHT VERTEBRAL ARTERY:  Patent with antegrade flow. LEFT CAROTID ARTERY: Normal color flow and velocity waveforms. Bifurcation is patent without significant calcific or noncalcific stenosis. LEFT VERTEBRAL ARTERY:  Patent with antegrade flow. Upper extremity blood pressures: Not obtained. IMPRESSION: No evidence of hemodynamically significant stenosis of either right or left  internal carotid artery. Electronically Signed   By: Burman Nieves M.D.   On: 05/02/2022 19:22   CT HEAD WO CONTRAST  Result Date: 05/02/2022 CLINICAL DATA:  Provided history: Weakness. Additional history provided: Right-sided weakness and facial droop. Fall yesterday. EXAM: CT HEAD WITHOUT CONTRAST TECHNIQUE: Contiguous axial images were obtained from the base of the skull through the vertex without intravenous contrast. RADIATION DOSE REDUCTION: This exam was performed according to the departmental dose-optimization program which includes automated exposure control, adjustment of the mA and/or kV according to patient size and/or use of iterative reconstruction technique. COMPARISON:  Prior head CT examinations 05/02/2022 and earlier. FINDINGS: Brain: Moderate to moderately advanced cerebral atrophy. Moderate patchy and ill-defined hypoattenuation within the cerebral white matter, nonspecific but compatible with chronic small vessel ischemic disease. Chronic lacunar infarcts within the left basal ganglia and bilateral thalami. Redemonstrated small chronic infarct within the left cerebellar hemisphere. There is no acute intracranial hemorrhage. No demarcated cortical infarct. No extra-axial fluid collection. No evidence of an intracranial mass. No midline shift. Vascular: No hyperdense vessel. Atherosclerotic calcifications. Skull: No fracture or aggressive osseous lesion. Sinuses/Orbits: No mass or acute finding within the imaged orbits. Extensive partial opacification of the left frontal sinus due to the presence of mucosal thickening and fluid. 8 mm mucous retention cyst within the right frontoethmoidal recess. Minimal mucosal thickening and fluid scattered within the bilateral ethmoid air cells. Minimal mucosal thickening within the right sphenoid sinus, and  within the right maxillary sinus at the imaged levels. Other: Small right mastoid effusion. IMPRESSION: 1. No evidence of acute intracranial  abnormality. 2. Chronic small vessel ischemic disease with chronic infarcts, as described and stable from the prior head CT of 04/03/2022. 3. Moderate to moderately advance cerebral atrophy. 4. Paranasal sinus disease at the imaged levels, as described. 5. Small right mastoid effusion. Electronically Signed   By: Kellie Simmering D.O.   On: 05/02/2022 13:27    Microbiology: Results for orders placed or performed during the hospital encounter of 10/15/21  Resp Panel by RT-PCR (Flu A&B, Covid) Nasopharyngeal Swab     Status: None   Collection Time: 10/15/21  2:56 PM   Specimen: Nasopharyngeal Swab; Nasopharyngeal(NP) swabs in vial transport medium  Result Value Ref Range Status   SARS Coronavirus 2 by RT PCR NEGATIVE NEGATIVE Final    Comment: (NOTE) SARS-CoV-2 target nucleic acids are NOT DETECTED.  The SARS-CoV-2 RNA is generally detectable in upper respiratory specimens during the acute phase of infection. The lowest concentration of SARS-CoV-2 viral copies this assay can detect is 138 copies/mL. A negative result does not preclude SARS-Cov-2 infection and should not be used as the sole basis for treatment or other patient management decisions. A negative result may occur with  improper specimen collection/handling, submission of specimen other than nasopharyngeal swab, presence of viral mutation(s) within the areas targeted by this assay, and inadequate number of viral copies(<138 copies/mL). A negative result must be combined with clinical observations, patient history, and epidemiological information. The expected result is Negative.  Fact Sheet for Patients:  EntrepreneurPulse.com.au  Fact Sheet for Healthcare Providers:  IncredibleEmployment.be  This test is no t yet approved or cleared by the [place] FDA and  has been authorized for detection and/or diagnosis of SARS-CoV-2 by FDA under an Emergency Use Authorization (EUA). This EUA will  remain  in effect (meaning this test can be used) for the duration of the COVID-19 declaration under Section 564(b)(1) of the Act, 21 U.S.C.section 360bbb-3(b)(1), unless the authorization is terminated  or revoked sooner.       Influenza A by PCR NEGATIVE NEGATIVE Final   Influenza B by PCR NEGATIVE NEGATIVE Final    Comment: (NOTE) The Xpert Xpress SARS-CoV-2/FLU/RSV plus assay is intended as an aid in the diagnosis of influenza from Nasopharyngeal swab specimens and should not be used as a sole basis for treatment. Nasal washings and aspirates are unacceptable for Xpert Xpress SARS-CoV-2/FLU/RSV testing.  Fact Sheet for Patients: EntrepreneurPulse.com.au  Fact Sheet for Healthcare Providers: IncredibleEmployment.be  This test is not yet approved or cleared by the [place] FDA and has been authorized for detection and/or diagnosis of SARS-CoV-2 by FDA under an Emergency Use Authorization (EUA). This EUA will remain in effect (meaning this test can be used) for the duration of the COVID-19 declaration under Section 564(b)(1) of the Act, 21 U.S.C. section 360bbb-3(b)(1), unless the authorization is terminated or revoked.  Performed at [facility], [address] [place], [place] [address]     Labs: CBC: Recent Labs  Lab 05/10/22 1702 05/11/22 0505 05/12/22 1103  WBC 8.0 8.2 7.2  NEUTROABS 5.7  --   --   HGB 11.9* 12.3* 12.5*  HCT 36.7* 37.4* 37.8*  MCV 90.4 88.0 88.9  PLT 211 221 332   Basic Metabolic Panel: Recent Labs  Lab 05/10/22 1702 05/11/22 1809 05/12/22 1103  NA 136  --  136  K 4.5  --  3.4*  CL  104  --  108  CO2 24  --  24  GLUCOSE 140*  --  117*  BUN 14  --  13  CREATININE 1.26*  --  1.34*  CALCIUM 7.8*  --  8.0*  MG  --  1.8  --    Liver Function Tests: Recent Labs  Lab 05/10/22 1702  AST 28  ALT 7  ALKPHOS 287*  BILITOT 1.1  PROT 4.9*  ALBUMIN 2.1*   CBG: Recent Labs  Lab  05/10/22 1609  GLUCAP 91    Discharge time spent: greater than 30 minutes.  Signed: Alba Cory, MD [facility] 05/12/2022

## 2022-05-12 NOTE — Evaluation (Signed)
Occupational Therapy Evaluation Patient Details Name: Justin Hendrix MRN: [id number] DOB: 24-Feb-1923 Today's Date: 05/12/2022   History of Present Illness Pt is a 88 year old male admitted with right-sided weakness and facial droop; PMH significant for TIA, Parkinson disease, paroxysmal A-fib, GERD, CKD stage IIIa   Clinical Impression   Chart reviewed, pt greeted in room agreeable to OT evaluation. Pt is alert, oriented to person, place, date, grossly oriented to situation. Pt presents with deficits in strength, endurance, activity tolerance, balance all affecting safe and optimal ADL completion. Per chart review, pt will have assist at home as family lives with him. Recommend discharge home with home health OT. OT will continue to follow acutely.      Recommendations for follow up therapy are one component of a multi-disciplinary discharge planning process, led by the attending physician.  Recommendations may be updated based on patient status, additional functional criteria and insurance authorization.   Follow Up Recommendations  Home health OT    Assistance Recommended at Discharge Frequent or constant Supervision/Assistance  Patient can return home with the following A little help with walking and/or transfers;A little help with bathing/dressing/bathroom;Assist for transportation;Direct supervision/assist for financial management;Assistance with cooking/housework;Help with stairs or ramp for entrance    Functional Status Assessment  Patient has had a recent decline in their functional status and demonstrates the ability to make significant improvements in function in a reasonable and predictable amount of time.  Equipment Recommendations  None recommended by OT    Recommendations for Other Services       Precautions / Restrictions Precautions Precautions: Fall Restrictions Weight Bearing Restrictions: No      Mobility Bed Mobility Overal bed mobility: Needs  Assistance Bed Mobility: Supine to Sit     Supine to sit: Supervision          Transfers Overall transfer level: Needs assistance Equipment used: Rolling walker (2 wheels) Transfers: Sit to/from Stand Sit to Stand: Min guard                  Balance Overall balance assessment: Needs assistance Sitting-balance support: Feet supported Sitting balance-Leahy Scale: Good     Standing balance support: Bilateral upper extremity supported, During functional activity Standing balance-Leahy Scale: Fair                             ADL either performed or assessed with clinical judgement   ADL Overall ADL's : Needs assistance/impaired     Grooming: Wash/dry hands;Wash/dry face;Sitting;Set up               Lower Body Dressing: Maximal assistance   Toilet Transfer: Min guard;Rolling walker (2 wheels) Toilet Transfer Details (indicate cue type and reason): short amb transfter, vcs for RW use Toileting- Clothing Manipulation and Hygiene: Maximal assistance               Vision Baseline Vision/History: 1 Wears glasses Patient Visual Report: No change from baseline       Perception     Praxis      Pertinent Vitals/Pain Pain Assessment Pain Assessment: No/denies pain     Hand Dominance Right   Extremity/Trunk Assessment Upper Extremity Assessment Upper Extremity Assessment: Generalized weakness (no RUE weakness noted)   Lower Extremity Assessment Lower Extremity Assessment: Generalized weakness       Communication Communication Communication: HOH   Cognition Arousal/Alertness: Awake/alert Behavior During Therapy: Flat affect Overall Cognitive Status: No family/caregiver present to  determine baseline cognitive functioning Area of Impairment: Memory, Safety/judgement, Awareness, Problem solving                     Memory: Decreased short-term memory   Safety/Judgement: Decreased awareness of deficits, Decreased awareness of  safety Awareness: Emergent Problem Solving: Requires verbal cues, Requires tactile cues       General Comments       Exercises     Shoulder Instructions      Home Living Family/patient expects to be discharged to:: Private residence Living Arrangements: Children Available Help at Discharge: Family;Available 24 hours/day Type of Home: House Home Access: Ramped entrance     Home Layout: One level     Bathroom Shower/Tub: Teacher, early years/pre: Standard     Home Equipment: Cane - single Barista (2 wheels)   Additional Comments: attempted to contact son via phone, message left at time of eval writing, PLOF obtained from chart/patient endorses information      Prior Functioning/Environment Prior Level of Function : Needs assist             Mobility Comments: Pt report amb with no AD, has a walker if he needs it ADLs Comments: Pt reports he gets dressed, bathes (with assistance for his back) with MOD I; family assists with IADLs        OT Problem List: Decreased strength;Decreased cognition;Decreased safety awareness;Decreased activity tolerance      OT Treatment/Interventions: Self-care/ADL training;Therapeutic exercise;Patient/family education;Therapeutic activities    OT Goals(Current goals can be found in the care plan section) Acute Rehab OT Goals Patient Stated Goal: go home OT Goal Formulation: With patient Time For Goal Achievement: 05/26/22 Potential to Achieve Goals: Good ADL Goals Pt Will Perform Grooming: with supervision;sitting Pt Will Transfer to Toilet: with supervision Pt Will Perform Toileting - Clothing Manipulation and hygiene: with supervision  OT Frequency: Min 2X/week    Co-evaluation              AM-PAC OT "6 Clicks" Daily Activity     Outcome Measure Help from another person eating meals?: A Little Help from another person taking care of personal grooming?: A Little Help from another person  toileting, which includes using toliet, bedpan, or urinal?: A Little Help from another person bathing (including washing, rinsing, drying)?: A Little Help from another person to put on and taking off regular upper body clothing?: A Little Help from another person to put on and taking off regular lower body clothing?: A Lot 6 Click Score: 17   End of Session Equipment Utilized During Treatment: Rolling walker (2 wheels) Nurse Communication: Mobility status  Activity Tolerance: Patient tolerated treatment well Patient left: in chair;with chair alarm set;with call bell/phone within reach  OT Visit Diagnosis: Muscle weakness (generalized) (M62.81)                Time: 2585-2778 OT Time Calculation (min): 26 min Charges:  OT General Charges $OT Visit: 1 Visit OT Evaluation $OT Eval Low Complexity: 1 Low Shanon Payor, OTD OTR/L  05/12/22, 1:15 PM

## 2022-05-12 NOTE — Progress Notes (Signed)
PT Cancellation Note  Patient Details Name: Justin Hendrix MRN: [id number] DOB: 1923/05/30   Cancelled Treatment:    Reason Eval/Treat Not Completed: Medical issues which prohibited therapy (per chart review, last vitals show BP 180/124mmHg. Will defer PT evaluation to later date/time once medically appropriate.)   Tonjia Parillo C 05/12/2022, 7:34 AM

## 2022-05-12 NOTE — Evaluation (Signed)
Physical Therapy Evaluation Patient Details Name: Justin Hendrix MRN: [id number] DOB: Jul 23, 1923 Today's Date: 05/12/2022  History of Present Illness  Justin Hendrix is a 85yoM who comes to [facility] on 05/10/22 under code stroke, pt with Left hemi weakness and left facial droop.  Pt was recently admitted on 10/9 for right sided weakness and right facial droop, but resolved after a few hours. PMH: BPH, chronic prostatitis, elevated PSA, urinary retention, Parkinson's disease, peresyncope, gait ataxia, PPM, AF on eliquis.  Clinical Impression  Pt admitted with above Dx. Pt has functional limitations due to deficits below (see "PT Problem List"). Pt not able to provide details on baseline functional status, hence taken from primary caregiver previous day. Pt in chair on arrival, Pt's BP still elevated prior to entry per most recent vitals check, however RN Janett Billow clears pt for activity, reports pt has orders for permissive HTN- author unable to find these orders, also not seen in MD or neurology POC. Pt able to rise to standing, remain up for several minutes, and AMB c RW all without any significant LOB, no frank signs of exertion noted, all without any need for physical assistance at supervision level. Pt appears to be moving at his most recent baseline. Pt was orthostatic previous day, but not upon today's assessment. Recommend continued HHPT services as he was PTA. No DME needs at this time. PT signing off.   Orthostatic VS for the past 24 hrs (Last 3 readings):  BP- Lying Pulse- Lying BP- Sitting Pulse- Sitting BP- Standing at 0 minutes Pulse- Standing at 0 minutes BP- Standing at 3 minutes Pulse- Standing at 3 minutes  05/12/22 1026 -- -- 133/81 60 145/88 110 (!) 145/92 60  05/11/22 1629 (!) 149/98 59 120/78 63 93/68 68 -- --        Recommendations for follow up therapy are one component of a multi-disciplinary discharge planning process, led by the attending physician.  Recommendations may be  updated based on patient status, additional functional criteria and insurance authorization.  Follow Up Recommendations Home health PT      Assistance Recommended at Discharge Intermittent Supervision/Assistance  Patient can return home with the following  Assistance with cooking/housework;Assist for transportation;Help with stairs or ramp for entrance;A little help with bathing/dressing/bathroom;A little help with walking and/or transfers;Direct supervision/assist for financial management;Direct supervision/assist for medications management    Equipment Recommendations None recommended by PT  Recommendations for Other Services       Functional Status Assessment Patient has not had a recent decline in their functional status     Precautions / Restrictions Precautions Precautions: Fall Restrictions Weight Bearing Restrictions: No      Mobility  Bed Mobility                    Transfers Overall transfer level: Needs assistance Equipment used: Rolling walker (2 wheels) Transfers: Sit to/from Stand Sit to Stand: Supervision                Ambulation/Gait Ambulation/Gait assistance: Supervision, Min guard Gait Distance (Feet): 100 Feet Assistive device: Rolling walker (2 wheels)         General Gait Details: safe use of RW, no LOB with turns, consistently reports to feel ok when asked  Stairs            Wheelchair Mobility    Modified Rankin (Stroke Patients Only)       Balance Overall balance assessment: Modified Independent, Mild deficits observed, not formally tested, History of  Falls                                           Pertinent Vitals/Pain Pain Assessment Pain Assessment: No/denies pain    Home Living Family/patient expects to be discharged to:: Private residence Living Arrangements: Children Available Help at Discharge: Family;Available 24 hours/day Type of Home: House Home Access: Ramped entrance        Home Layout: One level Home Equipment: Cane - single Barista (2 wheels)      Prior Function Prior Level of Function : Needs assist             Mobility Comments: Pt/son report pt sometimes uses 2WW but leaves it throughout the house. When family see him struggling a bit, they ensure he has his walker ADLs Comments: Pt generally able to complete bathing, dressing, and toileting without assist. Sons assist with med mgt, transportation, meals and cleaning.     Hand Dominance   Dominant Hand: Right    Extremity/Trunk Assessment   Upper Extremity Assessment Upper Extremity Assessment: Generalized weakness;Overall [facility] for tasks assessed    Lower Extremity Assessment Lower Extremity Assessment: Generalized weakness;Overall WFL for tasks assessed       Communication   Communication: HOH  Cognition Arousal/Alertness: Awake/alert Behavior During Therapy: Flat affect Overall Cognitive Status: Difficult to assess                                 General Comments: follows commands, but cannot provide much reports when asked to compared his performance to baseline        General Comments      Exercises     Assessment/Plan    PT Assessment All further PT needs can be met in the next venue of care  PT Problem List Decreased mobility;Cardiopulmonary status limiting activity       PT Treatment Interventions      PT Goals (Current goals can be found in the Care Plan section)  Acute Rehab PT Goals PT Goal Formulation: All assessment and education complete, DC therapy    Frequency       Co-evaluation               AM-PAC PT "6 Clicks" Mobility  Outcome Measure Help needed turning from your back to your side while in a flat bed without using bedrails?: A Little Help needed moving from lying on your back to sitting on the side of a flat bed without using bedrails?: A Little Help needed moving to and from a bed to a chair (including a  wheelchair)?: A Little Help needed standing up from a chair using your arms (e.g., wheelchair or bedside chair)?: A Little Help needed to walk in hospital room?: A Little Help needed climbing 3-5 steps with a railing? : A Lot 6 Click Score: 17    End of Session Equipment Utilized During Treatment: Gait belt Activity Tolerance: Patient tolerated treatment well;No increased pain Patient left: in chair;with call bell/phone within reach;with chair alarm set Nurse Communication: Mobility status PT Visit Diagnosis: Other symptoms and signs involving the nervous system (Z61.096)    Time: 0454-0981 PT Time Calculation (min) (ACUTE ONLY): 17 min   Charges:   PT Evaluation $PT Eval Moderate Complexity: 1 Mod        10:54 AM, 05/12/22  Etta Grandchild, PT, DPT Physical Therapist - [facility]  [phone] ([place])    Gwenna Fuston C 05/12/2022, 10:48 AM

## 2022-05-17 NOTE — Progress Notes (Signed)
Remote pacemaker transmission.   

## 2022-06-01 ENCOUNTER — Other Ambulatory Visit: Payer: Self-pay | Admitting: Urology

## 2022-06-01 ENCOUNTER — Other Ambulatory Visit: Payer: Self-pay

## 2022-06-01 DIAGNOSIS — N401 Enlarged prostate with lower urinary tract symptoms: Secondary | ICD-10-CM

## 2022-06-01 NOTE — Telephone Encounter (Signed)
Pt is requesting a refill. 

## 2022-06-03 ENCOUNTER — Telehealth: Payer: Self-pay | Admitting: Urology

## 2022-06-03 DIAGNOSIS — N401 Enlarged prostate with lower urinary tract symptoms: Secondary | ICD-10-CM

## 2022-06-03 NOTE — Telephone Encounter (Signed)
Patient's son Molly Maduro) called back and appointment was scheduled for 06/08/22 with Dr. Lonna Cobb.

## 2022-06-03 NOTE — Telephone Encounter (Signed)
.  left message to have patient/son return my call.  Pt last seen 02/2021, per last OV pt needs to be seen yearly in order to continue to receive refills.  Refill denied

## 2022-06-06 MED ORDER — FINASTERIDE 5 MG PO TABS: 5.0000 mg | ORAL_TABLET | Freq: Every day | ORAL | 3 refills | Status: AC

## 2022-06-08 ENCOUNTER — Ambulatory Visit: Payer: Medicare PPO | Admitting: Urology

## 2022-06-25 ENCOUNTER — Other Ambulatory Visit: Payer: Self-pay | Admitting: Urology

## 2022-08-25 DEATH — deceased
